# Patient Record
Sex: Male | Born: 1960 | Race: Black or African American | Hispanic: No | Smoking: Never smoker
Health system: Southern US, Community
[De-identification: ages and names within clinical notes are randomized; demographics above are authoritative.]

## PROBLEM LIST (undated history)

## (undated) DIAGNOSIS — G35D Multiple sclerosis, unspecified: Secondary | ICD-10-CM

## (undated) DIAGNOSIS — G8250 Quadriplegia, unspecified: Secondary | ICD-10-CM

## (undated) DIAGNOSIS — J45909 Unspecified asthma, uncomplicated: Secondary | ICD-10-CM

## (undated) DIAGNOSIS — G35 Multiple sclerosis: Secondary | ICD-10-CM

## (undated) DIAGNOSIS — I10 Essential (primary) hypertension: Secondary | ICD-10-CM

## (undated) DIAGNOSIS — R269 Unspecified abnormalities of gait and mobility: Secondary | ICD-10-CM

## (undated) DIAGNOSIS — N400 Enlarged prostate without lower urinary tract symptoms: Secondary | ICD-10-CM

## (undated) HISTORY — DX: Multiple sclerosis: G35

## (undated) HISTORY — PX: OTHER SURGICAL HISTORY: SHX169

## (undated) HISTORY — DX: Quadriplegia, unspecified: G82.50

## (undated) HISTORY — DX: Unspecified abnormalities of gait and mobility: R26.9

## (undated) HISTORY — DX: Benign prostatic hyperplasia without lower urinary tract symptoms: N40.0

## (undated) HISTORY — DX: Essential (primary) hypertension: I10

## (undated) HISTORY — DX: Unspecified asthma, uncomplicated: J45.909

## (undated) HISTORY — DX: Multiple sclerosis, unspecified: G35.D

---

## 2009-01-31 ENCOUNTER — Emergency Department (HOSPITAL_COMMUNITY): Admission: EM | Admit: 2009-01-31 | Discharge: 2009-01-31 | Payer: Self-pay | Admitting: Emergency Medicine

## 2009-02-17 ENCOUNTER — Encounter: Admission: RE | Admit: 2009-02-17 | Discharge: 2009-04-28 | Payer: Self-pay | Admitting: Neurology

## 2009-05-05 ENCOUNTER — Encounter: Admission: RE | Admit: 2009-05-05 | Discharge: 2009-05-21 | Payer: Self-pay | Admitting: Neurology

## 2010-05-23 ENCOUNTER — Encounter: Payer: Self-pay | Admitting: Neurology

## 2010-08-04 LAB — POCT I-STAT, CHEM 8
Calcium, Ion: 1.16 mmol/L (ref 1.12–1.32)
Chloride: 105 mEq/L (ref 96–112)
HCT: 41 % (ref 39.0–52.0)
Potassium: 3.5 mEq/L (ref 3.5–5.1)
Sodium: 141 mEq/L (ref 135–145)

## 2010-08-04 LAB — URINALYSIS, ROUTINE W REFLEX MICROSCOPIC
Ketones, ur: NEGATIVE mg/dL
Leukocytes, UA: NEGATIVE
Nitrite: NEGATIVE
Protein, ur: NEGATIVE mg/dL
Urobilinogen, UA: 0.2 mg/dL (ref 0.0–1.0)

## 2010-08-04 LAB — URINE CULTURE: Culture: NO GROWTH

## 2010-11-14 ENCOUNTER — Other Ambulatory Visit: Payer: Self-pay | Admitting: Emergency Medicine

## 2010-11-14 DIAGNOSIS — N5089 Other specified disorders of the male genital organs: Secondary | ICD-10-CM

## 2010-11-15 ENCOUNTER — Ambulatory Visit
Admission: RE | Admit: 2010-11-15 | Discharge: 2010-11-15 | Disposition: A | Discharge status: Home / Self Care | Payer: 59 | Source: Ambulatory Visit | Attending: Emergency Medicine | Admitting: Emergency Medicine

## 2010-11-15 DIAGNOSIS — N5089 Other specified disorders of the male genital organs: Secondary | ICD-10-CM

## 2011-11-13 ENCOUNTER — Other Ambulatory Visit: Payer: Self-pay | Admitting: Emergency Medicine

## 2011-11-21 ENCOUNTER — Other Ambulatory Visit: Payer: Self-pay | Admitting: Emergency Medicine

## 2011-11-21 NOTE — Telephone Encounter (Signed)
Need chart to nurses station. 

## 2011-12-06 ENCOUNTER — Other Ambulatory Visit: Payer: Self-pay | Admitting: *Deleted

## 2011-12-06 MED ORDER — HYDROCHLOROTHIAZIDE 12.5 MG PO CAPS: 12.5000 mg | ORAL_CAPSULE | Freq: Every day | ORAL | Status: DC

## 2011-12-07 ENCOUNTER — Ambulatory Visit (INDEPENDENT_AMBULATORY_CARE_PROVIDER_SITE_OTHER): Payer: 59 | Admitting: Internal Medicine

## 2011-12-07 VITALS — BP 120/80 | HR 80 | Temp 97.9°F | Resp 16

## 2011-12-07 DIAGNOSIS — R39198 Other difficulties with micturition: Secondary | ICD-10-CM

## 2011-12-07 DIAGNOSIS — R3919 Other difficulties with micturition: Secondary | ICD-10-CM

## 2011-12-07 DIAGNOSIS — G35 Multiple sclerosis: Secondary | ICD-10-CM

## 2011-12-07 DIAGNOSIS — I10 Essential (primary) hypertension: Secondary | ICD-10-CM

## 2011-12-07 MED ORDER — BACLOFEN 20 MG PO TABS: 20.0000 mg | ORAL_TABLET | Freq: Three times a day (TID) | ORAL | Status: DC

## 2011-12-07 MED ORDER — DUTASTERIDE 0.5 MG PO CAPS: 0.5000 mg | ORAL_CAPSULE | Freq: Every day | ORAL | Status: DC

## 2011-12-07 MED ORDER — HYDROCHLOROTHIAZIDE 12.5 MG PO CAPS: 12.5000 mg | ORAL_CAPSULE | Freq: Every day | ORAL | Status: DC

## 2011-12-07 NOTE — Progress Notes (Signed)
  Subjective:    Patient ID: Randy Shaw, male    DOB: [birth year], 51 y.o.   MRN: [id number]  HPI Running out of medications. Needs renewal of his hctz avodart and baclofen. See the neurologist regularily dr love. See the urologist at [facility] urologyy. Has routine check ups with dr Merla Riches. No complaints at present. Does not remember if he has been taking the avodart recently. Bp has been controlled.   Review of Systems  Constitutional:       In a wheelchair chronically for Multiple Schlerosis  HENT: Negative.   Eyes: Negative.   Respiratory: Negative.   Cardiovascular: Negative.   Gastrointestinal: Negative.   Genitourinary: Negative.  Negative for urgency and frequency.  Musculoskeletal: Negative.        Unchanged muscle weakness  Skin: Negative.   Neurological: Positive for weakness.  Hematological: Negative.   Psychiatric/Behavioral: Negative.   All other systems reviewed and are negative.       Objective:   Physical Exam  Nursing note and vitals reviewed. Constitutional: He appears well-developed and well-nourished.  HENT:  Head: Normocephalic and atraumatic.  Right Ear: External ear normal.  Left Ear: External ear normal.  Nose: Nose normal.  Mouth/Throat: Oropharynx is clear and moist.  Eyes: Conjunctivae and EOM are normal. Pupils are equal, round, and reactive to light.  Neck: Normal range of motion. Neck supple.  Cardiovascular: Normal rate, regular rhythm and normal heart sounds.   Pulmonary/Chest: Effort normal and breath sounds normal.  Abdominal: Soft. Bowel sounds are normal.  Musculoskeletal:       Weakness lower extrems unchanged  Neurological: He displays abnormal reflex. Coordination abnormal.       Secondary to ms  Skin: Skin is warm and dry.  Psychiatric: He has a normal mood and affect. His behavior is normal. Judgment and thought content normal.          Assessment & Plan:  Multiple schlerosis Hypertension Will refilll meds Pt  instructed to followup with hte urologist to see if he is to continue the avodart. Also told to routinely check his bp to evaluate hypertension stablility.

## 2011-12-07 NOTE — Patient Instructions (Signed)
Take meds as directed. See your neurologist and urologist in follow up.

## 2011-12-11 ENCOUNTER — Other Ambulatory Visit: Payer: Self-pay | Admitting: Physician Assistant

## 2011-12-11 NOTE — Telephone Encounter (Signed)
Patient just seen, but has urologist-- can we refill?

## 2012-02-16 ENCOUNTER — Other Ambulatory Visit: Payer: Self-pay | Admitting: Internal Medicine

## 2012-02-17 ENCOUNTER — Other Ambulatory Visit: Payer: Self-pay | Admitting: Internal Medicine

## 2012-02-17 ENCOUNTER — Telehealth: Payer: Self-pay

## 2012-02-29 ENCOUNTER — Other Ambulatory Visit: Payer: Self-pay | Admitting: Physician Assistant

## 2012-03-11 ENCOUNTER — Telehealth: Payer: Self-pay

## 2012-03-11 NOTE — Telephone Encounter (Signed)
patient wants to know if we can change his rx for baclofen. He states he takes tid and would like to know if the quantity can be increased. Please advise

## 2012-03-11 NOTE — Telephone Encounter (Signed)
Patient says we need to make modifications on his medication please call patient at [phone]

## 2012-03-12 MED ORDER — BACLOFEN 20 MG PO TABS: 20.0000 mg | ORAL_TABLET | Freq: Three times a day (TID) | ORAL | Status: DC

## 2012-03-12 NOTE — Telephone Encounter (Signed)
Please inform patient quantity increased and sent in.

## 2012-03-12 NOTE — Telephone Encounter (Signed)
Notified pt new Rx sent in. Pt thanked [place].

## 2012-04-22 ENCOUNTER — Other Ambulatory Visit: Payer: Self-pay | Admitting: Internal Medicine

## 2012-05-19 ENCOUNTER — Other Ambulatory Visit: Payer: Self-pay | Admitting: Physician Assistant

## 2012-05-19 NOTE — Telephone Encounter (Signed)
Needs office visit.

## 2012-06-03 ENCOUNTER — Other Ambulatory Visit: Payer: Self-pay | Admitting: Physician Assistant

## 2012-06-24 ENCOUNTER — Other Ambulatory Visit: Payer: Self-pay | Admitting: Physician Assistant

## 2012-06-26 ENCOUNTER — Ambulatory Visit (INDEPENDENT_AMBULATORY_CARE_PROVIDER_SITE_OTHER): Payer: 59 | Admitting: Internal Medicine

## 2012-06-26 ENCOUNTER — Encounter: Payer: Self-pay | Admitting: Internal Medicine

## 2012-06-26 VITALS — BP 112/64 | HR 77 | Temp 97.9°F | Resp 16 | Ht 75.0 in | Wt 197.4 lb

## 2012-06-26 DIAGNOSIS — G35 Multiple sclerosis: Secondary | ICD-10-CM

## 2012-06-26 DIAGNOSIS — M25559 Pain in unspecified hip: Secondary | ICD-10-CM

## 2012-06-26 DIAGNOSIS — M62838 Other muscle spasm: Secondary | ICD-10-CM

## 2012-06-26 DIAGNOSIS — M25552 Pain in left hip: Secondary | ICD-10-CM

## 2012-06-26 DIAGNOSIS — I10 Essential (primary) hypertension: Secondary | ICD-10-CM

## 2012-06-26 MED ORDER — BACLOFEN 20 MG PO TABS: ORAL_TABLET | ORAL | Status: DC

## 2012-06-26 MED ORDER — MELOXICAM 15 MG PO TABS: 15.0000 mg | ORAL_TABLET | Freq: Every day | ORAL | Status: DC

## 2012-06-26 MED ORDER — HYDROCHLOROTHIAZIDE 12.5 MG PO CAPS: ORAL_CAPSULE | ORAL | Status: DC

## 2012-06-29 ENCOUNTER — Encounter: Payer: Self-pay | Admitting: *Deleted

## 2012-06-29 ENCOUNTER — Other Ambulatory Visit: Payer: Self-pay | Admitting: *Deleted

## 2012-06-29 DIAGNOSIS — G35 Multiple sclerosis: Secondary | ICD-10-CM | POA: Insufficient documentation

## 2012-06-29 DIAGNOSIS — N138 Other obstructive and reflux uropathy: Secondary | ICD-10-CM | POA: Insufficient documentation

## 2012-06-29 DIAGNOSIS — N401 Enlarged prostate with lower urinary tract symptoms: Secondary | ICD-10-CM | POA: Insufficient documentation

## 2012-06-29 DIAGNOSIS — I10 Essential (primary) hypertension: Secondary | ICD-10-CM | POA: Insufficient documentation

## 2012-06-29 NOTE — Progress Notes (Signed)
  Subjective:    Patient ID: Randy Shaw, male    DOB: [birth year], 52 y.o.   MRN: [id number]  HPI here for medication refills Patient of Dr. Imagene Gurney w/ MS-doing well on interferon beta-1a ,dimethyl fumarate and baclofen. Also followed by a urology for BPH on Avodart. Followed here for hypertension. Recent complaints of hip pain on the left. Uses wheelchair/mobile device. Pain is intermittent it and does not always interfere with activity. No hip swelling. No known injury. Occasionally it seems as if the left leg will not straighten out the because of this hip or upper thigh area.   Recent labs at neurology  Review of Systems No fever chills or night sweats No weight loss No headaches or vision changes No chest pain or palpitations No edema    Objective:   Physical Exam BP 112/64  Pulse 77  Temp(Src) 97.9 F (36.6 C) (Oral)  Resp 16  Ht 6\' 3"  (1.905 m)  Wt 197 lb 6.4 oz (89.54 kg)  BMI 24.67 kg/m2  SpO2 100% HEENT clear Heart regular without murmur Lungs clear No peripheral edema/good peripheral pulses Left hip has a good range of motion without pain/no pain with pressure over the lateral aspect of the hip/no swelling or mass in the upper thigh/no cords/negative Homans      Assessment & Plan:  HTN (hypertension) - Plan: hydrochlorothiazide (MICROZIDE) 12.5 MG capsule refilled  Muscle spasticity/multiple sclerosis -[facility]  Hip pain, acute, left - Plan: meloxicam (MOBIC) 15 MG tablet-for 2 weeks with general stretching  Followup if not improving or if this begins to interfere with his activity level  BPH- urology  Followup in 6 months for more general exam with labs

## 2013-02-11 ENCOUNTER — Telehealth: Payer: Self-pay | Admitting: Neurology

## 2013-02-11 NOTE — Telephone Encounter (Signed)
Given to [place] to assign

## 2013-02-12 NOTE — Telephone Encounter (Signed)
Reassigned to Dr. Willis. 

## 2013-02-25 ENCOUNTER — Other Ambulatory Visit: Payer: Self-pay

## 2013-02-25 DIAGNOSIS — M62838 Other muscle spasm: Secondary | ICD-10-CM

## 2013-02-25 MED ORDER — BACLOFEN 20 MG PO TABS: ORAL_TABLET | ORAL | Status: DC

## 2013-04-16 ENCOUNTER — Telehealth: Payer: Self-pay | Admitting: Neurology

## 2013-04-16 NOTE — Telephone Encounter (Signed)
Patient called requesting Tecfidera refill. Please call.

## 2013-04-17 MED ORDER — DIMETHYL FUMARATE 240 MG PO CPDR: 240.0000 mg | DELAYED_RELEASE_CAPSULE | Freq: Two times a day (BID) | ORAL | Status: DC

## 2013-04-22 ENCOUNTER — Ambulatory Visit (INDEPENDENT_AMBULATORY_CARE_PROVIDER_SITE_OTHER): Payer: 59 | Admitting: Neurology

## 2013-04-22 ENCOUNTER — Encounter (INDEPENDENT_AMBULATORY_CARE_PROVIDER_SITE_OTHER): Payer: Self-pay

## 2013-04-22 ENCOUNTER — Encounter: Payer: Self-pay | Admitting: Neurology

## 2013-04-22 VITALS — BP 144/88 | HR 90 | Ht 75.0 in | Wt 211.0 lb

## 2013-04-22 DIAGNOSIS — Z5181 Encounter for therapeutic drug level monitoring: Secondary | ICD-10-CM

## 2013-04-22 DIAGNOSIS — R269 Unspecified abnormalities of gait and mobility: Secondary | ICD-10-CM | POA: Insufficient documentation

## 2013-04-22 DIAGNOSIS — G35 Multiple sclerosis: Secondary | ICD-10-CM

## 2013-04-22 HISTORY — DX: Unspecified abnormalities of gait and mobility: R26.9

## 2013-04-22 LAB — CBC WITH DIFFERENTIAL
Eos: 6 %
Monocytes: 10 %
Neutrophils Relative %: 59 %
Platelets: 216 10*3/uL (ref 150–379)
RBC: 5.07 x10E6/uL (ref 4.14–5.80)
WBC: 5.1 10*3/uL (ref 3.4–10.8)

## 2013-04-22 LAB — COMPREHENSIVE METABOLIC PANEL
AST: 21 IU/L (ref 0–40)
Alkaline Phosphatase: 69 IU/L (ref 39–117)
BUN/Creatinine Ratio: 23 — ABNORMAL HIGH (ref 9–20)
BUN: 18 mg/dL (ref 6–24)
CO2: 30 mmol/L — ABNORMAL HIGH (ref 18–29)
Chloride: 103 mmol/L (ref 96–108)
Sodium: 143 mmol/L (ref 134–144)

## 2013-04-22 MED ORDER — DIMETHYL FUMARATE 240 MG PO CPDR: 240.0000 mg | DELAYED_RELEASE_CAPSULE | Freq: Two times a day (BID) | ORAL | Status: DC

## 2013-04-22 NOTE — Patient Instructions (Signed)
Multiple Sclerosis Multiple sclerosis (MS) is a disease of the central nervous system. Its cause is unknown. It is more common in the northern states than in the southern states. There is a higher incidence of MS in women. There is a wide variation in the symptoms (problems) of MS. This is because of the many different ways it affects the central nervous system. It often comes on in episodes or attacks. These attacks may last weeks to months. There may be long periods of nearly no problems between attacks. The main symptoms include visual problems (associated with eye pain), numbness, weakness, and paralysis in extremities (arms/hands and legs/feet). There may also be tremors and problems with balance and walking. The age when MS starts is variable. Advances in medicine continue to improve the treatment of this illness. There is no known cure for MS but there are medications that help. MS is not an inherited illness, although your risk of getting this disease is higher if you have a relative with MS. The best radiologic (x-ray) study for MS is an MRI (magnetic resonance imaging). There are medications available to decrease the number and frequency of attacks. SYMPTOMS  The symptoms of MS are caused by loss of insulation (myelin) of the nerves of the brain. When this happens, brain signals do not get transmitted properly or may not get transmitted at all. Some of the problems caused by this include:   Numbness.  Weakness.  Paralysis in extremities.  Visual problems, eye pain.  Balance problems.  Tremors. DIAGNOSIS  Your caregiver can do studies on you to make this diagnosis. This may include specialized X-rays and spinal fluid studies. HOME CARE INSTRUCTIONS   Take medications as directed by your caregiver. Baclofen is a drug commonly used to reduce muscle spasticity. Steroids are often used for short term relief.  Exercise as directed.  Use physical and occupational therapy as directed by  your caregiver. Careful attention to this medical care can help avoid depression.  See your caregiver if you begin to have problems with depression. This is a common problem in MS. Patients often continue to work many years after the diagnosis of MS. Document Released: 04/14/2000 Document Revised: 07/10/2011 Document Reviewed: 11/21/2006 Exit[employer] Patient Information 2014 [employer].  

## 2013-04-22 NOTE — Progress Notes (Signed)
Reason for visit: Multiple sclerosis  Randy Shaw is an 52 y.o. male  History of present illness:  Randy Shaw is a 52 year old right-handed black male with a history of multiple sclerosis and a gait disorder. 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 The patient has spasticity in the lower extremities. MRI evaluations in the past have shown fairly extensive involvement of the brain and spinal cord. The patient is on baclofen for spasticity, and he does note some painless spasms in the legs during the day and night. The patient has a brace involving the left knee and ankle. The patient lives alone at home, and he has a wheelchair and a scooter for mobility. The patient indicates that he can walk with a walker, but he can only walk very slowly. The patient has fallen on occasion. The patient was last seen through this office in January of 2013. Approximately one year ago, the patient was switched from Rebif to Tecfidera. The patient tolerated the medication well. The patient does have some problems with double vision, slurred speech, without problems swallowing. The patient reports some urgency of the bladder, no incontinence. The patient has not had recent blood work done. The patient returns to this office for further evaluation. In the past, the patient has been on Avonex, then switched to Betaseron, and then to Rebif.  Past Medical History  Diagnosis Date  . Multiple sclerosis   . Hypertension   . BPH (benign prostatic hyperplasia)   . Asthma   . Abnormality of gait 04/22/2013    Past Surgical History  Procedure Laterality Date  . None      Family History  Problem Relation Age of Onset  . Cancer Mother     breast  . Stroke Father   . Cancer - Colon Father   . Multiple sclerosis Neg Hx     Social history:  reports that he has never smoked. He does not have any smokeless tobacco history on file. He reports that he does not drink alcohol or use  illicit drugs.    Allergies  Allergen Reactions  . Peanut-Containing Drug Products Shortness Of Breath  . Zanaflex [Tizanidine Hcl] Shortness Of Breath    Medications:  Current Outpatient Prescriptions on File Prior to Visit  Medication Sig Dispense Refill  . baclofen (LIORESAL) 20 MG tablet TAKE ONE TABLET BY MOUTH THREE TIMES DAILY  270 each  0  . hydrochlorothiazide (MICROZIDE) 12.5 MG capsule TAKE ONE CAPSULE BY MOUTH EVERY DAY  90 capsule  3  . Tamsulosin HCl (FLOMAX) 0.4 MG CAPS Take 1 capsule (0.4 mg total) by mouth daily. Urologist to write future refills  30 capsule  0   No current facility-administered medications on file prior to visit.    ROS:  Out of a complete 14 system review of symptoms, the patient complains only of the following symptoms, and all other reviewed systems are negative.  Gait disorder Stiffness in the legs Weakness, numbness Double vision  Blood pressure 144/88, pulse 90, height 6\' 3"  (1.905 m), weight 211 lb (95.709 kg).  Physical Exam  General: The patient is alert and cooperative at the time of the examination.  Skin: No significant peripheral edema is noted.   Neurologic Exam  Mental status: The patient is oriented x 3.  Cranial nerves: Facial symmetry is present. Speech is normal, no aphasia or dysarthria is noted. Extraocular movements are full, but the patient has some divergence of gaze with superior  gaze. The patient has evidence of a left INO. Visual fields are full.  Motor: The patient has good strength in the upper extremities. With the lower extremities, there is 4 minus/5 strength of the left leg, the left knee and left ankle are brace. The patient has 4/5 strength of the right lower extremity,some increased motor tone noted.  Sensory examination: Soft touch sensation is symmetric on the face and arms.  Coordination: The patient has good finger-nose-finger with the right arm, dysmetria seen on the left. The patient is able to  perform heel to shin with some difficulty with the right leg, unable to perform with the left.  Gait and station: The patient is in a scooter, gait was not tested.  Reflexes: Deep tendon reflexes are symmetric, actually somewhat depressed in the legs..   Assessment/Plan:  1. Multiple sclerosis  2. Gait disorder  The patient is tolerating the Tecfidera fairly well. The patient will undergo blood work today, and he will followup in about 6 months. The last MRI evaluations that I have on record were done at [facility] in 2009. We will consider a repeat MRI evaluation of the brain and cervical spinal cord on the next revisit. A prescription was given for Tecfidera, samples were given, as the patient is out of his medication. The baclofen dosing can be adjusted if needed.   Marlan Palau MD 04/22/2013 8:35 AM  [facility] [address] [place], [place] [address]  Phone [phone] Fax [phone]

## 2013-04-23 ENCOUNTER — Telehealth: Payer: Self-pay

## 2013-04-23 NOTE — Telephone Encounter (Signed)
Message copied by Doree Barthel on Wed Apr 23, 2013  8:27 AM ------      Message from: Stephanie Acre      Created: Tue Apr 22, 2013  5:30 PM       Please call the patient. The blood work results are unremarkable. Thank you.            ----- Message -----         From: [facility] Lab Results In Interface         Sent: 04/22/2013   4:38 PM           To: York Spaniel, MD                   ------

## 2013-05-13 ENCOUNTER — Ambulatory Visit (INDEPENDENT_AMBULATORY_CARE_PROVIDER_SITE_OTHER): Payer: 59 | Admitting: Family Medicine

## 2013-05-13 ENCOUNTER — Ambulatory Visit: Payer: 59

## 2013-05-13 VITALS — BP 150/90 | HR 84 | Temp 98.2°F | Resp 16 | Ht 75.0 in | Wt 208.0 lb

## 2013-05-13 DIAGNOSIS — S40022A Contusion of left upper arm, initial encounter: Secondary | ICD-10-CM

## 2013-05-13 DIAGNOSIS — M79609 Pain in unspecified limb: Secondary | ICD-10-CM

## 2013-05-13 DIAGNOSIS — S40029A Contusion of unspecified upper arm, initial encounter: Secondary | ICD-10-CM

## 2013-05-13 NOTE — Patient Instructions (Signed)
Continue symptomatic treatment of your shoulder. Apply heat if necessary. Take Tylenol or ibuprofen for pain. Return if symptoms persist

## 2013-05-13 NOTE — Telephone Encounter (Signed)
Tried to reach patient several times. No answer.

## 2013-05-13 NOTE — Progress Notes (Signed)
Subjective: 53 year old gentleman who has a almost 20 year history of MS. He is disabled from this but gets around in his home satisfactorily. He is able to get himself up and move between pieces of furniture. He fell by his bed almost a week ago, striking hard on his left shoulder. He says that he has many falls, but it is unusual for him to fall this hard. He took a heavy blow on the deltoid area of the right shoulder. It is bruised badly, and is still painful. However he has good range of motion of the shoulder. He was inclined just to give it more time to resolve, but his visiting nurse felt a very poor that he see a doctor for it. The left arm his his weaker and less coordinated arm. His right arm is his dominant arm.  Objective: Alert oriented gentleman riding a motor scooter. He has a large bruise about 2 x 3.5" on his left upper arm laterally. It is tender over the bruise. However the surrounding area is not particularly tender. His shoulder motion is adequate. Grip is very good.  Assessment:  Contusion and pain left upper arm  Plan: X-ray to make sure there is nothing cracked  [facility] reading (PRIMARY) by  Dr. Alwyn RenHOPPER Normal humerus.

## 2013-07-07 ENCOUNTER — Other Ambulatory Visit: Payer: Self-pay | Admitting: Internal Medicine

## 2013-07-10 ENCOUNTER — Telehealth: Payer: Self-pay

## 2013-07-10 NOTE — Telephone Encounter (Signed)
Patient came in for a visit with Randy Shaw on the 13th, he says the pharmacy is telling him he needs a visit with Randy Shaw to get rx refills please call [phone]

## 2013-07-10 NOTE — Telephone Encounter (Signed)
Advised pt he needed to come in for a follow up for his medical condition. We saw him on January 13 for an acute concern. Pt understands and will RTC.

## 2013-07-25 ENCOUNTER — Ambulatory Visit (INDEPENDENT_AMBULATORY_CARE_PROVIDER_SITE_OTHER): Payer: 59 | Admitting: Emergency Medicine

## 2013-07-25 VITALS — BP 132/90 | HR 80 | Temp 98.1°F | Resp 18 | Ht 75.0 in | Wt 207.0 lb

## 2013-07-25 DIAGNOSIS — I10 Essential (primary) hypertension: Secondary | ICD-10-CM

## 2013-07-25 DIAGNOSIS — G35 Multiple sclerosis: Secondary | ICD-10-CM

## 2013-07-25 MED ORDER — OXYBUTYNIN CHLORIDE 5 MG PO TABS: 5.0000 mg | ORAL_TABLET | Freq: Three times a day (TID) | ORAL | Status: DC

## 2013-07-25 MED ORDER — BACLOFEN 20 MG PO TABS: 20.0000 mg | ORAL_TABLET | Freq: Three times a day (TID) | ORAL | Status: DC

## 2013-07-25 MED ORDER — HYDROCHLOROTHIAZIDE 12.5 MG PO CAPS: ORAL_CAPSULE | ORAL | Status: DC

## 2013-07-25 NOTE — Patient Instructions (Signed)

## 2013-07-25 NOTE — Progress Notes (Signed)
[facility] [address], [place] [id number] [phone]  Date:  07/25/2013   Name:  Randy BarthelCharles M Shaw   DOB:  [birth year]   MRN:  [id number]  PCP:  Tonye PearsonOLITTLE, ROBERT P, MD    Chief Complaint: rx refills   History of Present Illness:  Randy Shaw is a 53 y.o. very pleasant male patient who presents with the following:  Says he needs refills on a number of drugs.  Feels well and has no acute complaints.  Denies other complaint or health concern today.   Patient Active Problem List   Diagnosis Date Noted  . Abnormality of gait 04/22/2013  . HTN (hypertension) 06/29/2012  . Multiple sclerosis 06/29/2012  . BPH (benign prostatic hypertrophy) with urinary obstruction 06/29/2012    Past Medical History  Diagnosis Date  . Multiple sclerosis   . Hypertension   . BPH (benign prostatic hyperplasia)   . Asthma   . Abnormality of gait 04/22/2013    Past Surgical History  Procedure Laterality Date  . None      History  Substance Use Topics  . Smoking status: Never Smoker   . Smokeless tobacco: Not on file  . Alcohol Use: No    Family History  Problem Relation Age of Onset  . Cancer Mother     breast  . Stroke Father   . Cancer - Colon Father   . Multiple sclerosis Neg Hx     Allergies  Allergen Reactions  . Peanut-Containing Drug Products Shortness Of Breath  . Zanaflex [Tizanidine Hcl] Shortness Of Breath    Medication list has been reviewed and updated.  Current Outpatient Prescriptions on File Prior to Visit  Medication Sig Dispense Refill  . albuterol (PROVENTIL HFA;VENTOLIN HFA) 108 (90 BASE) MCG/ACT inhaler Inhale 2 puffs into the lungs every 6 (six) hours as needed for wheezing or shortness of breath.      . Dimethyl Fumarate (TECFIDERA) 240 MG CPDR Take 1 capsule (240 mg total) by mouth 2 (two) times daily.  180 capsule  3  . Tamsulosin HCl (FLOMAX) 0.4 MG CAPS Take 1 capsule (0.4 mg total) by mouth daily. Urologist to write  future refills  30 capsule  0   No current facility-administered medications on file prior to visit.    Review of Systems:  As per HPI, otherwise negative.    Physical Examination: Filed Vitals:   07/25/13 1421  BP: 132/90  Pulse: 80  Temp: 98.1 F (36.7 C)  Resp: 18   Filed Vitals:   07/25/13 1421  Height: 6\' 3"  (1.905 m)  Weight: 207 lb (93.895 kg)   Body mass index is 25.87 kg/(m^2). Ideal Body Weight: Weight in (lb) to have BMI = 25: 199.6   GEN: WDWN, NAD, Non-toxic, Alert & Oriented x 3 HEENT: Atraumatic, Normocephalic.  Ears and Nose: No external deformity. EXTR: No clubbing/cyanosis/edema NEURO: Normal gait.  PSYCH: Normally interactive. Conversant. Not depressed or anxious appearing.  Calm demeanor.    Assessment and Plan: MS HBP BPH Needs labs but is not fasting   Signed,  Phillips OdorJeffery Louetta Hollingshead, MD

## 2013-07-25 NOTE — Addendum Note (Signed)
Addended byLevon Hedger A on: 07/25/2013 03:20 PM   Modules accepted: Orders

## 2013-07-30 ENCOUNTER — Other Ambulatory Visit (INDEPENDENT_AMBULATORY_CARE_PROVIDER_SITE_OTHER): Payer: 59

## 2013-07-30 DIAGNOSIS — G35 Multiple sclerosis: Secondary | ICD-10-CM

## 2013-07-30 DIAGNOSIS — I10 Essential (primary) hypertension: Secondary | ICD-10-CM

## 2013-07-30 LAB — CBC WITH DIFFERENTIAL/PLATELET
BASOS ABS: 0 10*3/uL (ref 0.0–0.1)
BASOS PCT: 1 % (ref 0–1)
EOS ABS: 0.3 10*3/uL (ref 0.0–0.7)
Eosinophils Relative: 7 % — ABNORMAL HIGH (ref 0–5)
HCT: 41 % (ref 39.0–52.0)
HEMOGLOBIN: 14.2 g/dL (ref 13.0–17.0)
Lymphocytes Relative: 32 % (ref 12–46)
Lymphs Abs: 1.3 10*3/uL (ref 0.7–4.0)
MCH: 27.8 pg (ref 26.0–34.0)
MCHC: 34.6 g/dL (ref 30.0–36.0)
MCV: 80.2 fL (ref 78.0–100.0)
MONOS PCT: 11 % (ref 3–12)
Monocytes Absolute: 0.5 10*3/uL (ref 0.1–1.0)
NEUTROS ABS: 2.1 10*3/uL (ref 1.7–7.7)
NEUTROS PCT: 49 % (ref 43–77)
Platelets: 214 10*3/uL (ref 150–400)
RBC: 5.11 MIL/uL (ref 4.22–5.81)
RDW: 12.9 % (ref 11.5–15.5)
WBC: 4.2 10*3/uL (ref 4.0–10.5)

## 2013-07-30 LAB — COMPLETE METABOLIC PANEL WITH GFR
ALBUMIN: 4.4 g/dL (ref 3.5–5.2)
ALK PHOS: 66 U/L (ref 39–117)
ALT: 26 U/L (ref 0–53)
AST: 21 U/L (ref 0–37)
BUN: 13 mg/dL (ref 6–23)
CALCIUM: 9.9 mg/dL (ref 8.4–10.5)
CHLORIDE: 103 meq/L (ref 96–112)
CO2: 30 mEq/L (ref 19–32)
Creat: 0.89 mg/dL (ref 0.50–1.35)
GFR, Est African American: >89 mL/min
GFR, Est Non African American: >89 mL/min
Glucose, Bld: 96 mg/dL (ref 70–99)
POTASSIUM: 3.9 meq/L (ref 3.5–5.3)
SODIUM: 141 meq/L (ref 135–145)
TOTAL PROTEIN: 7.2 g/dL (ref 6.0–8.3)
Total Bilirubin: 0.6 mg/dL (ref 0.2–1.2)

## 2013-07-30 LAB — LIPID PANEL
CHOLESTEROL: 196 mg/dL (ref 0–200)
HDL: 57 mg/dL (ref >39)
LDL CALC: 117 mg/dL — AB (ref 0–99)
Total CHOL/HDL Ratio: 3.4 Ratio
Triglycerides: 111 mg/dL (ref <150)
VLDL: 22 mg/dL (ref 0–40)

## 2013-07-30 NOTE — Progress Notes (Signed)
Patient here for labs only. 

## 2013-07-31 LAB — PSA: PSA: 0.7 ng/mL (ref <=4.00)

## 2013-10-22 ENCOUNTER — Ambulatory Visit (INDEPENDENT_AMBULATORY_CARE_PROVIDER_SITE_OTHER): Payer: 59 | Admitting: Adult Health

## 2013-10-22 ENCOUNTER — Encounter: Payer: Self-pay | Admitting: Adult Health

## 2013-10-22 ENCOUNTER — Encounter (INDEPENDENT_AMBULATORY_CARE_PROVIDER_SITE_OTHER): Payer: Self-pay

## 2013-10-22 VITALS — BP 142/89 | HR 84 | Temp 98.2°F | Ht 75.0 in | Wt 196.0 lb

## 2013-10-22 DIAGNOSIS — R269 Unspecified abnormalities of gait and mobility: Secondary | ICD-10-CM

## 2013-10-22 DIAGNOSIS — G35 Multiple sclerosis: Secondary | ICD-10-CM

## 2013-10-22 NOTE — Patient Instructions (Signed)

## 2013-10-22 NOTE — Progress Notes (Addendum)
PATIENT: Randy Shaw DOB: Mar 31, 1961  REASON FOR VISIT: follow up HISTORY FROM: patient  HISTORY OF PRESENT ILLNESS: Mr. Nethercott is a 53 year old right-handed black male with a history of multiple sclerosis and a gait disorder. He returns today for follow-up. The patient is currently taking tecfidera and is tolerating it well. The patient takes baclofen for spasticity and feels that it has beneficial. Patient states that he forgot to take one of his doses of baclofen and actually fell because of the "stiffness." The fall occurred this past Saturday, patient states that he did hit his shoulder and states that it has been sore but he has full range of motion. 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  The patient uses a wheelchair primarily. The patient is able to ambulate using a walker but primarily uses the scooter. Patient does report some double vision that has been ongoing. Patient does have some urgency with urination. States that if he feels the urge he must go right then or else he will have an accident. Patient is unsure if the oxybutynin has been beneficial. Denies problems with bowels. Denies problems with swallowing. No new medical issues since the last visit.   REVIEW OF SYSTEMS: Full 14 system review of systems performed and notable only for:  Constitutional: N/A  Eyes: Double vision Ear/Nose/Throat: N/A  Skin: N/A  Cardiovascular: N/A  Respiratory: N/A  Gastrointestinal: N/A  Genitourinary: N/A Hematology/Lymphatic: N/A  Endocrine: N/A Musculoskeletal: Neck pain, next deafness  Allergy/Immunology: N/A  Neurological: Tremors Psychiatric: N/A Sleep: N/A   ALLERGIES: Allergies  Allergen Reactions  . Peanut-Containing Drug Products Shortness Of Breath  . Zanaflex [Tizanidine Hcl] Shortness Of Breath    HOME MEDICATIONS: Outpatient Prescriptions Prior to Visit  Medication Sig Dispense Refill  . albuterol (PROVENTIL HFA;VENTOLIN HFA)  108 (90 BASE) MCG/ACT inhaler Inhale 2 puffs into the lungs every 6 (six) hours as needed for wheezing or shortness of breath.      . baclofen (LIORESAL) 20 MG tablet Take 1 tablet (20 mg total) by mouth 3 (three) times daily.  90 tablet  12  . Dimethyl Fumarate (TECFIDERA) 240 MG CPDR Take 1 capsule (240 mg total) by mouth 2 (two) times daily.  180 capsule  3  . hydrochlorothiazide (MICROZIDE) 12.5 MG capsule TAKE ONE CAPSULE BY MOUTH EVERY DAY  30 capsule  12  . oxybutynin (DITROPAN) 5 MG tablet Take 1 tablet (5 mg total) by mouth 3 (three) times daily.  90 tablet  12  . Tamsulosin HCl (FLOMAX) 0.4 MG CAPS Take 1 capsule (0.4 mg total) by mouth daily. Urologist to write future refills  30 capsule  0   No facility-administered medications prior to visit.    PAST MEDICAL HISTORY: Past Medical History  Diagnosis Date  . Multiple sclerosis   . Hypertension   . BPH (benign prostatic hyperplasia)   . Asthma   . Abnormality of gait 04/22/2013    PAST SURGICAL HISTORY: Past Surgical History  Procedure Laterality Date  . None      FAMILY HISTORY: Family History  Problem Relation Age of Onset  . Cancer Mother     breast  . Stroke Father   . Cancer - Colon Father   . Multiple sclerosis Neg Hx     SOCIAL HISTORY: History   Social History  . Marital Status: Divorced    Spouse Name: N/A    Number of Children: 3  . Years of Education: 76  Occupational History  . diabled    Social History Main Topics  . Smoking status: Never Smoker   . Smokeless tobacco: Not on file  . Alcohol Use: No  . Drug Use: No  . Sexual Activity: Not on file   Other Topics Concern  . Not on file   Social History Narrative  . No narrative on file      PHYSICAL EXAM  Filed Vitals:   10/22/13 1317  BP: 142/89  Pulse: 84  Temp: 98.2 F (36.8 C)  TempSrc: Oral  Height: 6\' 3"  (1.905 m)  Weight: 196 lb (88.905 kg)   Body mass index is 24.5 kg/(m^2).  Generalized: Well developed, in no  acute distress   Neurological examination  Mentation: Alert oriented to time, place, history taking. Follows all commands speech and language fluent Cranial nerve II-XII:  Extraocular movements were full, visual field were full on confrontational test.  Motor: The motor testing reveals 5 over 5 strength of all upper extremities. 3/5 in lower extremities. Good symmetric motor tone is noted throughout.  Sensory: Sensory testing is intact to soft touch on all 4 extremities. No evidence of extinction is noted.  Coordination: Cerebellar testing reveals good finger-nose-finger. Difficult with heel-to-shin bilaterally.  Gait and station: patient in a scooter. Is able to stand with assistance but balance is unsteady.Tandem gait not attempted.  Reflexes: Deep tendon reflexes are symmetric and normal bilaterally.    DIAGNOSTIC DATA (LABS, IMAGING, TESTING) - I reviewed patient records, labs, notes, testing and imaging myself where available.  Lab Results  Component Value Date   WBC 4.2 07/30/2013   HGB 14.2 07/30/2013   HCT 41.0 07/30/2013   MCV 80.2 07/30/2013   PLT 214 07/30/2013      Component Value Date/Time   NA 141 07/30/2013 0952   NA 143 04/22/2013 0829   K 3.9 07/30/2013 0952   CL 103 07/30/2013 0952   CO2 30 07/30/2013 0952   GLUCOSE 96 07/30/2013 0952   GLUCOSE 112* 04/22/2013 0829   BUN 13 07/30/2013 0952   BUN 18 04/22/2013 0829   CREATININE 0.89 07/30/2013 0952   CREATININE 0.78 04/22/2013 0829   CALCIUM 9.9 07/30/2013 0952   PROT 7.2 07/30/2013 0952   PROT 7.1 04/22/2013 0829   ALBUMIN 4.4 07/30/2013 0952   AST 21 07/30/2013 0952   ALT 26 07/30/2013 0952   ALKPHOS 66 07/30/2013 0952   BILITOT 0.6 07/30/2013 0952   GFRNONAA >89 07/30/2013 0952   GFRNONAA 103 04/22/2013 0829   GFRAA >89 07/30/2013 0952   GFRAA 119 04/22/2013 0829   Lab Results  Component Value Date   CHOL 196 07/30/2013   HDL 57 07/30/2013   LDLCALC 130117* 07/30/2013   TRIG 111 07/30/2013   CHOLHDL 3.4 07/30/2013       ASSESSMENT AND  PLAN 53 y.o. year old male  has a past medical history of Multiple sclerosis; Hypertension; BPH (benign prostatic hyperplasia); Asthma; and Abnormality of gait (04/22/2013). here with:  1. Multiple sclerosis 2. Abnormality of gait  Patient has remained stable. Currently taking tecfidera and tolerating well. The patient did have a recent fall but states it was due to a missed baclofen dose. I recommended physical therapy for the patient but he deferred due to the co pay cost. Patient recently had blood work in April. Patient has not had an MRI of brain and cervical spine in several years. I will order MRI of brain and cervical spine today looking for progression of MS.  Patient should follow-up in 6 months or sooner if needed.   Butch Penny, MSN, NP-C 10/22/2013, 1:40 PM [facility] [address] [place], [place] [address] [phone]  Note: This document was prepared with digital dictation and possible smart phrase technology. Any transcriptional errors that result from this process are unintentional. I have read the note, and I agree with the clinical assessment and plan.  Lesly Dukes

## 2013-10-22 NOTE — Progress Notes (Signed)
I have read the note, and I agree with the clinical assessment and plan.  WILLIS,Hesham KEITH   

## 2013-10-29 ENCOUNTER — Other Ambulatory Visit: Payer: 59

## 2013-11-26 ENCOUNTER — Ambulatory Visit (INDEPENDENT_AMBULATORY_CARE_PROVIDER_SITE_OTHER): Payer: 59

## 2013-11-26 DIAGNOSIS — G35 Multiple sclerosis: Secondary | ICD-10-CM

## 2013-11-26 MED ORDER — GADOPENTETATE DIMEGLUMINE 469.01 MG/ML IV SOLN: 20.0000 mL | Freq: Once | INTRAVENOUS | Status: AC | PRN

## 2013-11-28 ENCOUNTER — Ambulatory Visit (INDEPENDENT_AMBULATORY_CARE_PROVIDER_SITE_OTHER): Payer: 59 | Admitting: Emergency Medicine

## 2013-11-28 VITALS — BP 124/82 | HR 81 | Temp 98.1°F | Resp 18

## 2013-11-28 DIAGNOSIS — L97509 Non-pressure chronic ulcer of other part of unspecified foot with unspecified severity: Secondary | ICD-10-CM

## 2013-11-28 DIAGNOSIS — L84 Corns and callosities: Secondary | ICD-10-CM

## 2013-11-28 DIAGNOSIS — G35 Multiple sclerosis: Secondary | ICD-10-CM

## 2013-11-28 DIAGNOSIS — L97512 Non-pressure chronic ulcer of other part of right foot with fat layer exposed: Secondary | ICD-10-CM

## 2013-11-28 NOTE — Progress Notes (Signed)
[facility] [address], [place] [id number] [phone]  Date:  11/28/2013   Name:  Randy BarthelCharles M Shaw   DOB:  [birth year]   MRN:  [id number]  PCP:  Tonye PearsonOLITTLE, ROBERT P, MD    Chief Complaint: sore on right foot, sore on left toe and Diplopia   History of Present Illness:  Randy Shaw is a 53 y.o. very pleasant male patient who presents with the following:  Several week history of a sore on the medial left midfoot.  No recollection of injury. Not healing.  Wears an orthotic on the left leg.  Fully disabled No improvement with over the counter medications or other home remedies. Denies other complaint or health concern today.   Patient Active Problem List   Diagnosis Date Noted  . Abnormality of gait 04/22/2013  . HTN (hypertension) 06/29/2012  . Multiple sclerosis 06/29/2012  . BPH (benign prostatic hypertrophy) with urinary obstruction 06/29/2012    Past Medical History  Diagnosis Date  . Multiple sclerosis   . Hypertension   . BPH (benign prostatic hyperplasia)   . Asthma   . Abnormality of gait 04/22/2013    Past Surgical History  Procedure Laterality Date  . None      History  Substance Use Topics  . Smoking status: Never Smoker   . Smokeless tobacco: Not on file  . Alcohol Use: No    Family History  Problem Relation Age of Onset  . Cancer Mother     breast  . Stroke Father   . Cancer - Colon Father   . Multiple sclerosis Neg Hx     Allergies  Allergen Reactions  . Peanut-Containing Drug Products Shortness Of Breath  . Zanaflex [Tizanidine Hcl] Shortness Of Breath    Medication list has been reviewed and updated.  Current Outpatient Prescriptions on File Prior to Visit  Medication Sig Dispense Refill  . albuterol (PROVENTIL HFA;VENTOLIN HFA) 108 (90 BASE) MCG/ACT inhaler Inhale 2 puffs into the lungs every 6 (six) hours as needed for wheezing or shortness of breath.      . baclofen (LIORESAL) 20 MG tablet Take 1 tablet  (20 mg total) by mouth 3 (three) times daily.  90 tablet  12  . Dimethyl Fumarate (TECFIDERA) 240 MG CPDR Take 1 capsule (240 mg total) by mouth 2 (two) times daily.  180 capsule  3  . hydrochlorothiazide (MICROZIDE) 12.5 MG capsule TAKE ONE CAPSULE BY MOUTH EVERY DAY  30 capsule  12  . oxybutynin (DITROPAN) 5 MG tablet Take 1 tablet (5 mg total) by mouth 3 (three) times daily.  90 tablet  12  . Tamsulosin HCl (FLOMAX) 0.4 MG CAPS Take 1 capsule (0.4 mg total) by mouth daily. Urologist to write future refills  30 capsule  0   No current facility-administered medications on file prior to visit.    Review of Systems:  As per HPI, otherwise negative.    Physical Examination: Filed Vitals:   11/28/13 1356  BP: 124/82  Pulse: 81  Temp: 98.1 F (36.7 C)  Resp: 18   There were no vitals filed for this visit. There is no weight on file to calculate BMI. Ideal Body Weight:     GEN: WDWN, NAD, Non-toxic, Alert & Oriented x 3 HEENT: Atraumatic, Normocephalic.  Ears and Nose: No external deformity. EXTR: No clubbing/cyanosis/edema NEURO: assisted unsteady gait.  PSYCH: Normally interactive. Conversant. Not depressed or anxious appearing.  Calm demeanor.  RIGHT foot:  0.5 cm ulceration medial midfoot with granulation tissue  LEFT foot:  Forefoot callous  Assessment and Plan: Ulceration right foot. Chemical cauterization  Signed,  Phillips Odor, MD

## 2013-12-04 NOTE — Progress Notes (Signed)
Quick Note:  Spoke with patient and informed him that his MRI scan had no changes from his previous scan, no progression of MS. Patient verbalized understanding, instructed patient to call back with any questions or concerns. ______

## 2014-03-30 ENCOUNTER — Other Ambulatory Visit: Payer: Self-pay | Admitting: Neurology

## 2014-04-27 ENCOUNTER — Ambulatory Visit: Payer: 59 | Admitting: Adult Health

## 2014-04-29 ENCOUNTER — Encounter: Payer: Self-pay | Admitting: Adult Health

## 2014-05-08 ENCOUNTER — Ambulatory Visit (INDEPENDENT_AMBULATORY_CARE_PROVIDER_SITE_OTHER): Payer: 59 | Admitting: Adult Health

## 2014-05-08 ENCOUNTER — Encounter: Payer: Self-pay | Admitting: Adult Health

## 2014-05-08 VITALS — BP 126/83 | HR 98 | Ht 75.0 in | Wt 190.0 lb

## 2014-05-08 DIAGNOSIS — Z0289 Encounter for other administrative examinations: Secondary | ICD-10-CM

## 2014-05-08 DIAGNOSIS — R413 Other amnesia: Secondary | ICD-10-CM

## 2014-05-08 DIAGNOSIS — R269 Unspecified abnormalities of gait and mobility: Secondary | ICD-10-CM

## 2014-05-08 DIAGNOSIS — G35 Multiple sclerosis: Secondary | ICD-10-CM

## 2014-05-08 DIAGNOSIS — Z5181 Encounter for therapeutic drug level monitoring: Secondary | ICD-10-CM

## 2014-05-08 NOTE — Progress Notes (Signed)
PATIENT: Randy Shaw DOB: 30-May-1960  REASON FOR VISIT: follow up HISTORY FROM: patient CHIEF COMPLAINT: Multiple sclerosis, abnormality of gait  HISTORY OF PRESENT ILLNESS: Randy Shaw is a 54 year old male with a history of multiple sclerosis and a gait disorder. He returns today for follow-up. He continues to take tecfidera and tolerating it well but he feels that his memory has been affected. He states that he missed the last appointment but was not aware of the appointment. He has to write down things in order to remember it. Denies having to give up anything due to his memory. Lives at home alone. He does all the cooking and cleaning at his house. Denies any new numbness or weakness. He states that he has ongoing weakness on the left side. He states that he will have double vision but again this has been ongoing. Patient has urgency but no changes with bowels. He is currently on oxybutnin. He denies having any accidents. He continues to use baclofen and feels that it works well.   HISTORY 10/22/13: Randy Shaw is a 54 year old right-handed black male with a history of multiple sclerosis and a gait disorder. He returns today for follow-up. The patient is currently taking tecfidera and is tolerating it well. The patient takes baclofen for spasticity and feels that it has beneficial. Patient states that he forgot to take one of his doses of baclofen and actually fell because of the "stiffness." The fall occurred this past Saturday, patient states that he did hit his shoulder and states that it has been sore but he has full range of motion. 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 The patient uses a wheelchair primarily. The patient is able to ambulate using a walker but primarily uses the scooter. Patient does report some double vision that has been ongoing. Patient does have some urgency with urination. States that if he feels the urge he must go right then or else  he will have an accident. Patient is unsure if the oxybutynin has been beneficial. Denies problems with bowels. Denies problems with swallowing. No new medical issues since the last visit.  REVIEW OF SYSTEMS: Out of a complete 14 system review of symptoms, the patient complains only of the following symptoms, and all other reviewed systems are negative.  Ringing in ears, double vision, flushing, insomnia, frequent waking, and frequency of urination, urinary decreased, walking difficulty, speech difficulty, weakness, decreased concentration, depression  ALLERGIES: Allergies  Allergen Reactions  . Peanut-Containing Drug Products Shortness Of Breath  . Zanaflex [Tizanidine Hcl] Shortness Of Breath    HOME MEDICATIONS: Outpatient Prescriptions Prior to Visit  Medication Sig Dispense Refill  . albuterol (PROVENTIL HFA;VENTOLIN HFA) 108 (90 BASE) MCG/ACT inhaler Inhale 2 puffs into the lungs every 6 (six) hours as needed for wheezing or shortness of breath.    . baclofen (LIORESAL) 20 MG tablet Take 1 tablet (20 mg total) by mouth 3 (three) times daily. 90 tablet 12  . hydrochlorothiazide (MICROZIDE) 12.5 MG capsule TAKE ONE CAPSULE BY MOUTH EVERY DAY 30 capsule 12  . oxybutynin (DITROPAN) 5 MG tablet Take 1 tablet (5 mg total) by mouth 3 (three) times daily. 90 tablet 12  . Tamsulosin HCl (FLOMAX) 0.4 MG CAPS Take 1 capsule (0.4 mg total) by mouth daily. Urologist to write future refills 30 capsule 0  . TECFIDERA 240 MG CPDR Take 1 capsule (240mg ) by  mouth twice a day 180 capsule 1   No  facility-administered medications prior to visit.    PAST MEDICAL HISTORY: Past Medical History  Diagnosis Date  . Multiple sclerosis   . Hypertension   . BPH (benign prostatic hyperplasia)   . Asthma   . Abnormality of gait 04/22/2013    PAST SURGICAL HISTORY: Past Surgical History  Procedure Laterality Date  . None      FAMILY HISTORY: Family History  Problem Relation Age of Onset  . Cancer  Mother     breast  . Stroke Father   . Cancer - Colon Father   . Multiple sclerosis Neg Hx     SOCIAL HISTORY: History   Social History  . Marital Status: Divorced    Spouse Name: N/A    Number of Children: 3  . Years of Education: 12   Occupational History  . diabled    Social History Main Topics  . Smoking status: Never Smoker   . Smokeless tobacco: Not on file  . Alcohol Use: No  . Drug Use: No  . Sexual Activity: Not on file   Other Topics Concern  . Not on file   Social History Narrative      PHYSICAL EXAM  Filed Vitals:   05/08/14 0917  BP: 126/83  Pulse: 98  Height:  (1.905 m)  Weight: 190 lb (86.183 kg)   Body mass index is 23.75 kg/(m^2). Generalized: Well developed, in no acute distress   Neurological examination  Mentation: Alert oriented to time, place, history taking. Follows all commands speech and language fluent. MMSE 30/30 Cranial nerve II-XII: Extraocular movements were full, visual field were full on confrontational test.  Motor: The motor testing reveals 5 over 5 strength of all upper extremities except  3/5 in lower extremities. Spasticity noted in the right leg.  Sensory: Sensory testing is intact to soft touch on all 4 extremities. No evidence of extinction is noted.  Coordination: Cerebellar testing reveals good finger-nose-finger. Difficult with heel-to-shin bilaterally.  Gait and station: patient in a motorized  scooter. Tandem gait not attempted.  Reflexes: Deep tendon reflexes are symmetric and normal bilaterally.  DIAGNOSTIC DATA (LABS, IMAGING, TESTING) - I reviewed patient records, labs, notes, testing and imaging myself where available.  Lab Results  Component Value Date   WBC 4.2 07/30/2013   HGB 14.2 07/30/2013   HCT 41.0 07/30/2013   MCV 80.2 07/30/2013   PLT 214 07/30/2013      Component Value Date/Time   NA 141 07/30/2013 0952   NA 143 04/22/2013 0829   K 3.9 07/30/2013 0952   CL 103 07/30/2013 0952     CO2 30 07/30/2013 0952   GLUCOSE 96 07/30/2013 0952   GLUCOSE 112* 04/22/2013 0829   BUN 13 07/30/2013 0952   BUN 18 04/22/2013 0829   CREATININE 0.89 07/30/2013 0952   CREATININE 0.78 04/22/2013 0829   CALCIUM 9.9 07/30/2013 0952   PROT 7.2 07/30/2013 0952   PROT 7.1 04/22/2013 0829   ALBUMIN 4.4 07/30/2013 0952   AST 21 07/30/2013 0952   ALT 26 07/30/2013 0952   ALKPHOS 66 07/30/2013 0952   BILITOT 0.6 07/30/2013 0952   GFRNONAA >89 07/30/2013 0952   GFRNONAA 103 04/22/2013 0829   GFRAA >89 07/30/2013 0952   GFRAA 119 04/22/2013 0829   Lab Results  Component Value Date   CHOL 196 07/30/2013   HDL 57 07/30/2013   LDLCALC 117* 07/30/2013   TRIG 111 07/30/2013   CHOLHDL 3.4 07/30/2013      ASSESSMENT AND PLAN  54 y.o. year old male  has a past medical history of Multiple sclerosis; Hypertension; BPH (benign prostatic hyperplasia); Asthma; and Abnormality of gait (04/22/2013). here with:  1. Multiple sclerosis 2. Abnormality of gait 3. Memory disturbance  Overall the patient has remained the same. He continues to take Tecfidera and is tolerating it well. I will check blood work today. The patient was concerned about his memory. His MMSE is 30 over 30. Depression could play a factor in his perception of memory loss. We will continue to follow his memory over time. The patient had MRI of the brain and cervical spine in July that showed no progression.If his symptoms worsen or he develops new symptoms he should let us know.Otherwise he should follow up in 6 months.  Butch Penny, MSN, NP-C 05/08/2014, 9:21 AM [facility] [address] [place], [place] [address] [phone]  Note: This document was prepared with digital dictation and possible smart phrase technology. Any transcriptional errors that result from this process are unintentional.

## 2014-05-08 NOTE — Progress Notes (Signed)
I have read the note, and I agree with the clinical assessment and plan.  Safira Proffit,Keon KEITH   

## 2014-05-08 NOTE — Patient Instructions (Addendum)
Continue tecfidera We will check blood work today.  We will follow your memory over time.

## 2014-05-09 LAB — CBC WITH DIFFERENTIAL
Basophils Absolute: 0 10*3/uL (ref 0.0–0.2)
Basos: 1 %
EOS: 5 %
Eosinophils Absolute: 0.2 10*3/uL (ref 0.0–0.4)
HCT: 40.3 % (ref 37.5–51.0)
HEMOGLOBIN: 13.7 g/dL (ref 12.6–17.7)
Immature Grans (Abs): 0 10*3/uL (ref 0.0–0.1)
Immature Granulocytes: 0 %
Lymphocytes Absolute: 1.2 10*3/uL (ref 0.7–3.1)
Lymphs: 26 %
MCH: 27.7 pg (ref 26.6–33.0)
MCHC: 34 g/dL (ref 31.5–35.7)
MCV: 82 fL (ref 79–97)
MONOS ABS: 0.5 10*3/uL (ref 0.1–0.9)
Monocytes: 11 %
NEUTROS ABS: 2.5 10*3/uL (ref 1.4–7.0)
Neutrophils Relative %: 57 %
Platelets: 228 10*3/uL (ref 150–379)
RBC: 4.94 x10E6/uL (ref 4.14–5.80)
RDW: 13.8 % (ref 12.3–15.4)
WBC: 4.5 10*3/uL (ref 3.4–10.8)

## 2014-05-09 LAB — COMPREHENSIVE METABOLIC PANEL
ALT: 27 IU/L (ref 0–44)
AST: 22 IU/L (ref 0–40)
Albumin/Globulin Ratio: 1.6 (ref 1.1–2.5)
Albumin: 4.5 g/dL (ref 3.5–5.5)
Alkaline Phosphatase: 69 IU/L (ref 39–117)
BILIRUBIN TOTAL: 0.5 mg/dL (ref 0.0–1.2)
BUN/Creatinine Ratio: 13 (ref 9–20)
BUN: 12 mg/dL (ref 6–24)
CALCIUM: 9.9 mg/dL (ref 8.7–10.2)
CO2: 24 mmol/L (ref 18–29)
CREATININE: 0.95 mg/dL (ref 0.76–1.27)
Chloride: 102 mmol/L (ref 97–108)
GFR calc Af Amer: 104 mL/min/{1.73_m2} (ref >59)
GFR calc non Af Amer: 90 mL/min/{1.73_m2} (ref >59)
GLUCOSE: 93 mg/dL (ref 65–99)
Globulin, Total: 2.9 g/dL (ref 1.5–4.5)
Potassium: 4.3 mmol/L (ref 3.5–5.2)
Sodium: 145 mmol/L — ABNORMAL HIGH (ref 134–144)
Total Protein: 7.4 g/dL (ref 6.0–8.5)

## 2014-06-30 ENCOUNTER — Encounter (HOSPITAL_BASED_OUTPATIENT_CLINIC_OR_DEPARTMENT_OTHER): Payer: Self-pay

## 2014-06-30 ENCOUNTER — Emergency Department (HOSPITAL_BASED_OUTPATIENT_CLINIC_OR_DEPARTMENT_OTHER)
Admission: EM | Admit: 2014-06-30 | Discharge: 2014-06-30 | Disposition: A | Discharge status: Home / Self Care | Payer: 59 | Attending: Emergency Medicine | Admitting: Emergency Medicine

## 2014-06-30 DIAGNOSIS — N3000 Acute cystitis without hematuria: Secondary | ICD-10-CM | POA: Insufficient documentation

## 2014-06-30 DIAGNOSIS — J45909 Unspecified asthma, uncomplicated: Secondary | ICD-10-CM | POA: Diagnosis not present

## 2014-06-30 DIAGNOSIS — G35 Multiple sclerosis: Secondary | ICD-10-CM | POA: Diagnosis not present

## 2014-06-30 DIAGNOSIS — I10 Essential (primary) hypertension: Secondary | ICD-10-CM | POA: Diagnosis not present

## 2014-06-30 DIAGNOSIS — N400 Enlarged prostate without lower urinary tract symptoms: Secondary | ICD-10-CM | POA: Diagnosis not present

## 2014-06-30 DIAGNOSIS — R339 Retention of urine, unspecified: Secondary | ICD-10-CM | POA: Diagnosis present

## 2014-06-30 DIAGNOSIS — Z79899 Other long term (current) drug therapy: Secondary | ICD-10-CM | POA: Diagnosis not present

## 2014-06-30 DIAGNOSIS — R338 Other retention of urine: Secondary | ICD-10-CM

## 2014-06-30 LAB — URINALYSIS, ROUTINE W REFLEX MICROSCOPIC
Bilirubin Urine: NEGATIVE
GLUCOSE, UA: NEGATIVE mg/dL
Ketones, ur: NEGATIVE mg/dL
NITRITE: NEGATIVE
Protein, ur: NEGATIVE mg/dL
SPECIFIC GRAVITY, URINE: 1.012 (ref 1.005–1.030)
UROBILINOGEN UA: 1 mg/dL (ref 0.0–1.0)
pH: 6.5 (ref 5.0–8.0)

## 2014-06-30 LAB — URINE MICROSCOPIC-ADD ON

## 2014-06-30 MED ORDER — CIPROFLOXACIN HCL 500 MG PO TABS: 500.0000 mg | ORAL_TABLET | Freq: Two times a day (BID) | ORAL | Status: DC

## 2014-06-30 MED ORDER — CIPROFLOXACIN HCL 500 MG PO TABS
500.0000 mg | ORAL_TABLET | Freq: Once | ORAL | Status: AC
  Administered 2014-06-30: 500 mg via ORAL
  Filled 2014-06-30: qty 1

## 2014-06-30 NOTE — ED Provider Notes (Signed)
CSN: [id number]     Arrival date & time 06/30/14  0410 History   First MD Initiated Contact with Patient 06/30/14 0434     Chief Complaint  Patient presents with  . Urinary Retention     (Consider location/radiation/quality/duration/timing/severity/associated sxs/prior Treatment) HPI  This is a 54 year old male with history of multiple sclerosis and neurogenic bladder. He has not been able to urinate since about 6:30 PM yesterday evening. He states his bladder feels distended. He is in moderate to severe discomfort. He attempted to catheterize himself twice without success. He also slid to the floor while in the bathroom and was able unable to get himself up. He contacted his brother who brought him to the ED. He denies injury. He has had similar episodes as a result of his multiple sclerosis but lives alone and is usually able to take care of himself.   Past Medical History  Diagnosis Date  . Multiple sclerosis   . Hypertension   . BPH (benign prostatic hyperplasia)   . Asthma   . Abnormality of gait 04/22/2013   Past Surgical History  Procedure Laterality Date  . None     Family History  Problem Relation Age of Onset  . Cancer Mother     breast  . Stroke Father   . Cancer - Colon Father   . Multiple sclerosis Neg Hx    History  Substance Use Topics  . Smoking status: Never Smoker   . Smokeless tobacco: Not on file  . Alcohol Use: No    Review of Systems  All other systems reviewed and are negative.   Allergies  Peanut-containing drug products and Zanaflex  Home Medications   Prior to Admission medications   Medication Sig Start Date End Date Taking? Authorizing Provider  albuterol (PROVENTIL HFA;VENTOLIN HFA) 108 (90 BASE) MCG/ACT inhaler Inhale 2 puffs into the lungs every 6 (six) hours as needed for wheezing or shortness of breath.    Historical Provider, MD  baclofen (LIORESAL) 20 MG tablet Take 1 tablet (20 mg total) by mouth 3 (three) times daily. 07/25/13    Carmelina Dane, MD  hydrochlorothiazide (MICROZIDE) 12.5 MG capsule TAKE ONE CAPSULE BY MOUTH EVERY DAY 07/25/13   Carmelina Dane, MD  oxybutynin (DITROPAN) 5 MG tablet Take 1 tablet (5 mg total) by mouth 3 (three) times daily. 07/25/13   Carmelina Dane, MD  Tamsulosin HCl (FLOMAX) 0.4 MG CAPS Take 1 capsule (0.4 mg total) by mouth daily. Urologist to write future refills 12/11/11   Nelva Nay, PA-C  TECFIDERA 240 MG CPDR Take 1 capsule (240mg ) by  mouth twice a day 03/30/14   York Spaniel, MD   BP 148/83 mmHg  Pulse 98  Temp(Src) 98.6 F (37 C) (Oral)  Resp 18  Ht 6\' 3"  (1.905 m)  Wt 200 lb (90.719 kg)  BMI 25.00 kg/m2  SpO2 98%   Physical Exam  General: Well-developed, well-nourished male in no acute distress; appearance consistent with age of record HENT: normocephalic; atraumatic Eyes: pupils equal, round and reactive to light; extraocular muscles intact; arcus senilis bilaterally Neck: supple Heart: regular rate and rhythm Lungs: clear to auscultation bilaterally Abdomen: soft; nondistended; distended bladder, palpation reproduces urge to urinate; bowel sounds present Extremities: No deformity; full range of motion; pulses normal Neurologic: Awake, alert and oriented; motor function intact in all extremities but with generalized weakness; no facial droop Skin: Warm and dry Psychiatric: Normal mood and affect    ED Course  Procedures (including critical care time)   MDM  Nursing notes and vitals signs, including pulse oximetry, reviewed.  Summary of this visit's results, reviewed by myself:  Labs:  Results for orders placed or performed during the hospital encounter of 06/30/14 (from the past 24 hour(s))  Urinalysis, Routine w reflex microscopic     Status: Abnormal   Collection Time: 06/30/14  4:31 AM  Result Value Ref Range   Color, Urine YELLOW YELLOW   APPearance CLEAR CLEAR   Specific Gravity, Urine 1.012 1.005 - 1.030   pH 6.5 5.0 - 8.0    Glucose, UA NEGATIVE NEGATIVE mg/dL   Hgb urine dipstick TRACE (A) NEGATIVE   Bilirubin Urine NEGATIVE NEGATIVE   Ketones, ur NEGATIVE NEGATIVE mg/dL   Protein, ur NEGATIVE NEGATIVE mg/dL   Urobilinogen, UA 1.0 0.0 - 1.0 mg/dL   Nitrite NEGATIVE NEGATIVE   Leukocytes, UA SMALL (A) NEGATIVE  Urine microscopic-add on     Status: None   Collection Time: 06/30/14  4:31 AM  Result Value Ref Range   Squamous Epithelial / LPF RARE RARE   WBC, UA 7-10 <3 WBC/hpf   RBC / HPF 0-2 <3 RBC/hpf   Bacteria, UA RARE RARE      Hanley Seamen, MD 06/30/14 [phone]

## 2014-06-30 NOTE — ED Notes (Signed)
Pt states he hasn't been able to urinate since 1830 yesterday, states attempted to cath himself twice but unsuccessfully. Distended bladder with tenderness on palpation

## 2014-07-01 ENCOUNTER — Ambulatory Visit (INDEPENDENT_AMBULATORY_CARE_PROVIDER_SITE_OTHER): Payer: 59 | Admitting: Internal Medicine

## 2014-07-01 ENCOUNTER — Encounter: Payer: Self-pay | Admitting: Internal Medicine

## 2014-07-01 ENCOUNTER — Telehealth: Payer: Self-pay | Admitting: Adult Health

## 2014-07-01 VITALS — BP 142/85 | HR 88 | Temp 98.2°F | Resp 16 | Wt 202.6 lb

## 2014-07-01 DIAGNOSIS — G35 Multiple sclerosis: Secondary | ICD-10-CM

## 2014-07-01 DIAGNOSIS — R339 Retention of urine, unspecified: Secondary | ICD-10-CM

## 2014-07-01 MED ORDER — BISACODYL 5 MG PO TBEC: DELAYED_RELEASE_TABLET | ORAL | Status: DC

## 2014-07-01 MED ORDER — CIPROFLOXACIN HCL 500 MG PO TABS: 500.0000 mg | ORAL_TABLET | Freq: Two times a day (BID) | ORAL | Status: DC

## 2014-07-01 MED ORDER — POLYETHYLENE GLYCOL 3350 17 GM/SCOOP PO POWD: 17.0000 g | Freq: Two times a day (BID) | ORAL | Status: DC | PRN

## 2014-07-01 MED ORDER — TAMSULOSIN HCL 0.4 MG PO CAPS: 0.4000 mg | ORAL_CAPSULE | Freq: Every day | ORAL | Status: DC

## 2014-07-01 NOTE — Telephone Encounter (Signed)
Patient stated Prior Authorization was needed for Rx TECFIDERA 240 MG CPDR.  Please call and advise.

## 2014-07-01 NOTE — Telephone Encounter (Signed)
Ins has been contacted and provided with clinical info.  Request is currently under review.  I called the patient back.  He is aware.

## 2014-07-02 ENCOUNTER — Telehealth: Payer: Self-pay | Admitting: Adult Health

## 2014-07-02 LAB — URINE CULTURE
COLONY COUNT: NO GROWTH
Culture: NO GROWTH

## 2014-07-02 NOTE — Progress Notes (Signed)
Subjective:    Patient ID: Randy Shaw, male    DOB: July 05, 1960, 54 y.o.   MRN: [id number]  HPI he presented to the emergency room 36 hours ago with acute urinary retention having failed at self-catheterization which had presented over the prior 12-24 hours. 940 mL were drained and he was sent home with indwelling catheter. He has a history of multiple sclerosis and benign prostate hypertrophy. He has had 1 prior percent like this more than year ago which was attributed to prosthetic infection and he was able to discontinue the catheter easily. The last time he's had any urinary retention was over a year ago. He's not had fever dysuria frequency urgency over the past week. He has noted no bowel movement in the last for 5 days. It is not typical for him to be constipated. He was started on Cipro at the emergency room yesterday with his first dose around 6 AM but his prescription was sent to mail order and he's had no medicine since. The urinalysis had minimal signs of infection and the culture still pending.  His MS has been stable for the most part but he has noticed an increased frequency of vision blurring over the last 2 months. His medications have not changed. Notably he continues on baclofen and tecfidera per Dr Anne Hahn. He rarely makes any attempt to walk anymore.  He has been on Flomax and did trip and in the past but has not been on these in a long time.   Review of Systems No fever chills or night sweats No nausea or vomiting No abdominal pain No peripheral edema    Objective:   Physical Exam BP 142/85 mmHg  Pulse 88  Temp(Src) 98.2 F (36.8 C) (Oral)  Resp 16  Wt 202 lb 9.6 oz (91.899 kg)  SpO2 99% He appears in no acute distress No flank tenderness No abdominal tenderness Alert and oriented Wheelchair bound     Assessment & Plan:  Acute urinary retention--etiology uncertain -History of BPH -Recent constipation -Multiple sclerosis -On baclofen -Possible  infection  Given that none of the above factors have been improved since yesterday, the catheter will remain in place and we will treat aggressively for constipation and infection and remove the catheter on Friday. I would prefer not to have this in place for more than these 3 days if at all possible based on my understanding of the current literature  Meds ordered this encounter  Medications  . DISCONTD: ciprofloxacin (CIPRO) 500 MG tablet    Sig: Take 1 tablet (500 mg total) by mouth 2 (two) times daily. One po bid    Dispense:  6 tablet    Refill:  0  . tamsulosin (FLOMAX) 0.4 MG CAPS capsule    Sig: Take 1 capsule (0.4 mg total) by mouth daily.    Dispense:  30 capsule    Refill:  0  . polyethylene glycol powder (GLYCOLAX/MIRALAX) powder    Sig: Take 17 g by mouth 2 (two) times daily as needed.    Dispense:  225 g    Refill:  1  . bisacodyl (DULCOLAX) 5 MG EC tablet    Sig: 1 tab every 12 hrs until has BM    Dispense:  10 tablet    Refill:  0  . ciprofloxacin (CIPRO) 500 MG tablet----he is awaiting another 14 pills sent by ER to his mail order pharmacy     Sig: Take 1 tablet (500 mg total) by mouth 2 (two)  times daily. One po bid    Dispense:  6 tablet    Refill:  0   Follow-up on 07/03/2014 for catheter removal

## 2014-07-02 NOTE — Telephone Encounter (Signed)
I called back.  Spoke with [employer].  He has noted patient file.

## 2014-07-02 NOTE — Telephone Encounter (Signed)
Randy Shaw with [facility] @ [phone], option 2, stated he received Prior authorization for Rx TECFIDERA 240 MG CPDR.  He's noticed patient's an established user, therefore questioning if PA is for a starter pack or 240 mg TECFIDERA.  Please call and advise.

## 2014-07-02 NOTE — Telephone Encounter (Signed)
Patient has been on the 240 mg Tecfidera. Shanda Bumps can you call and let them know.

## 2014-07-03 ENCOUNTER — Telehealth: Payer: Self-pay

## 2014-07-03 ENCOUNTER — Ambulatory Visit (INDEPENDENT_AMBULATORY_CARE_PROVIDER_SITE_OTHER): Payer: 59 | Admitting: Internal Medicine

## 2014-07-03 VITALS — BP 122/76 | HR 94 | Temp 98.1°F | Resp 18

## 2014-07-03 DIAGNOSIS — R339 Retention of urine, unspecified: Secondary | ICD-10-CM | POA: Diagnosis not present

## 2014-07-03 DIAGNOSIS — G35 Multiple sclerosis: Secondary | ICD-10-CM

## 2014-07-03 NOTE — Patient Instructions (Signed)
Take your usual dose of Flomax (tamsulosin) with a meal--- if you have not urinated by 8 or 9:00 take a second dose Go ahead and finish the Cipro tomorrow

## 2014-07-03 NOTE — Telephone Encounter (Signed)
[facility] ([facility]) has approved the request for coverage on Tecfidera effective until 07/02/2019 or until the policy changes or is terminated Ref # [id number]

## 2014-07-03 NOTE — Progress Notes (Signed)
Subjective:    Patient ID: Randy Shaw, male    DOB: 1960-12-02, 54 y.o.   MRN: [id number] This chart was scribed for Randy Sia, MD by Chestine Spore, ED Scribe. The patient was seen in room 11 at 10:57 AM.   Chief Complaint  Patient presents with  . Follow-up    cath     HPI  HPI Comments: Randy Shaw is a 54 y.o. male with a medical hx of MS, HTN, BPH with urinary obstruction who presents to the Emergency Department complaining of F/U for the removal of his catheter. Pt went to the ED on 06/30/14 after he failed to self-catherizebegan to have painful urinary retention. He had 940 mL was drained and he was sent home with an indwelling catheter. At follow-up on 3/2 we left the catheter in place to continue Cipro awaiting culture results and to clear up the constipation that he described. He has responded to MiraLAX and Dulcolax and his had several bowel movements.  Pt has had an episode like this before 1 year ago that was attributed to a prostate infection, and he was able to d/c the catheter with ease after treatment. No problems with retention sense. Pt has one cipro left from the prescription we gave him and when he called the pharmacy they reported that they didn't have any record of the Rx from ER.    Pt just saw Dr. Anne Hahn in [facility] felt his MS was stable. Mr. Cowley has noticed increased blurred vision over the past few months.7/15 MRI Multiple supratentorial and infratentorial chronic demyelinating plaques with Multiple chronic demyelinating plaques from C2 down to C6 levels.  Urine culture from the emergency room was negative  Patient Active Problem List   Diagnosis Date Noted  . Abnormality of gait 04/22/2013  . HTN (hypertension) 06/29/2012  . Multiple sclerosis 06/29/2012  . BPH (benign prostatic hypertrophy) with urinary obstruction 06/29/2012   Past Medical History  Diagnosis Date  . Multiple sclerosis   . Hypertension   . BPH (benign prostatic  hyperplasia)   . Asthma------ no recent problems    . Abnormality of gait 04/22/2013   Past Surgical History  Procedure Laterality Date  . None     Allergies  Allergen Reactions  . Peanut-Containing Drug Products Shortness Of Breath  . Zanaflex [Tizanidine Hcl] Shortness Of Breath   Prior to Admission medications   Medication Sig Start Date End Date Taking? Authorizing Provider  albuterol (PROVENTIL HFA;VENTOLIN HFA) 108 (90 BASE) MCG/ACT inhaler Inhale 2 puffs into the lungs every 6 (six) hours as needed for wheezing or shortness of breath.    Historical Provider, MD  baclofen (LIORESAL) 20 MG tablet Take 1 tablet (20 mg total) by mouth 3 (three) times daily. 07/25/13   Carmelina Dane, MD  bisacodyl (DULCOLAX) 5 MG EC tablet 1 tab every 12 hrs until has BM 07/01/14   Tonye Pearson, MD  ciprofloxacin (CIPRO) 500 MG tablet Take 1 tablet (500 mg total) by mouth 2 (two) times daily. One po bid 07/01/14   Tonye Pearson, MD  hydrochlorothiazide (MICROZIDE) 12.5 MG capsule TAKE ONE CAPSULE BY MOUTH EVERY DAY 07/25/13   Carmelina Dane, MD  oxybutynin (DITROPAN) 5 MG tablet Take 1 tablet (5 mg total) by mouth 3 (three) times daily. 07/25/13   Carmelina Dane, MD  polyethylene glycol powder (GLYCOLAX/MIRALAX) powder Take 17 g by mouth 2 (two) times daily as needed. 07/01/14   Tonye Pearson, MD  tamsulosin (FLOMAX) 0.4 MG CAPS capsule Take 1 capsule (0.4 mg total) by mouth daily. 07/01/14   Tonye Pearson, MD  TECFIDERA 240 MG CPDR Take 1 capsule ( ) by  mouth twice a day 03/30/14   York Spaniel, MD      Review of Systems  Constitutional: Negative for fever and chills.  Gastrointestinal: Negative for constipation.  Genitourinary: Negative for difficulty urinating.       Objective:   Physical Exam  Constitutional: He is oriented to person, place, and time. He appears well-developed and well-nourished. No distress.  HENT:  Head: Normocephalic and atraumatic.    Eyes: EOM are normal.  Neck: Neck supple.  Cardiovascular: Normal rate.   Pulmonary/Chest: Effort normal. No respiratory distress.  Genitourinary:  Catheter in place without any skin lesions. Bag is full with clear urine.    Musculoskeletal: Normal range of motion.  Neurological: He is alert and oriented to person, place, and time.  Skin: Skin is warm and dry.  Psychiatric: He has a normal mood and affect. His behavior is normal.  Nursing note and vitals reviewed.  Procedure= after deflating the bulb the catheter was removed with ease     BP 122/76 mmHg  Pulse 94  Temp(Src) 98.1 F (36.7 C) (Oral)  Resp 18  SpO2 99%  Assessment & Plan:   I have completed the patient encounter in its entirety as documented by the scribe, with editing by me where necessary. Jaan Fischel P. Merla Riches, M.D.  Acute urinary retention hopefully secondary to constipation but with several other potential factors He will continue Flomax but may discontinue Cipro since the culture was negative He will return at once for recatheterization if necessary

## 2014-07-15 ENCOUNTER — Telehealth: Payer: Self-pay | Admitting: Neurology

## 2014-07-15 NOTE — Telephone Encounter (Signed)
I spoke to the patient about the [employer] research trial. I introduced the trial to the patient, and the patient expressed interest. I will mailed the ICF today for the patient to review, and I will check with the patient in a week. °

## 2014-07-17 ENCOUNTER — Other Ambulatory Visit: Payer: Self-pay | Admitting: Internal Medicine

## 2014-07-27 ENCOUNTER — Other Ambulatory Visit: Payer: Self-pay | Admitting: Emergency Medicine

## 2014-08-06 ENCOUNTER — Ambulatory Visit (INDEPENDENT_AMBULATORY_CARE_PROVIDER_SITE_OTHER): Payer: Self-pay | Admitting: Neurology

## 2014-08-06 VITALS — BP 134/78 | HR 80 | Temp 97.6°F | Resp 16 | Ht 75.0 in | Wt 197.4 lb

## 2014-08-06 DIAGNOSIS — R269 Unspecified abnormalities of gait and mobility: Secondary | ICD-10-CM

## 2014-08-06 DIAGNOSIS — R413 Other amnesia: Secondary | ICD-10-CM

## 2014-08-06 DIAGNOSIS — G35 Multiple sclerosis: Secondary | ICD-10-CM

## 2014-08-06 MED ORDER — OXYBUTYNIN CHLORIDE 5 MG PO TABS: 5.0000 mg | ORAL_TABLET | Freq: Two times a day (BID) | ORAL | Status: DC

## 2014-08-06 NOTE — Progress Notes (Signed)
PATIENT: Randy Shaw DOB: 1960/08/31  [name] is a 54 year old male RH with a history of multiple sclerosis and a gait disorder.       The patient is currently taking tecfidera and is tolerating it well. The patient takes baclofen for spasticity and feels that it has beneficial. Patient states that he forgot to take one of his doses of baclofen and actually fell because of the "stiffness." . He is taking Baclofen 20mg  tid for few years, compliance with his medications.  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  He also has double vision.  He was treated with Avonex in 1995 for one year, was switched to betaseron from 1996 till 2015, when he started to take Tecfidra.  Last flare up was around 2005.  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 The patient uses a wheelchair primarily since 2005. The patient is able to ambulate using a walker but primarily uses the scooter. Patient does report some double vision that has been ongoing. Patient does have some urgency with urination.   Patient is unsure if the oxybutynin has been beneficial. Denies problems with bowels.   He lives alone at home, no longer driving, able to transfer himself in and out of his scooter.   REVIEW OF SYSTEMS: Out of a complete 14 system review of symptoms, the patient complains only of the following symptoms, and all other reviewed systems are negative.  Ringing in ears, double vision, flushing, insomnia, frequent waking, and frequency of urination, urinary decreased, walking difficulty, speech difficulty, weakness, decreased concentration, depression  ALLERGIES: Allergies  Allergen Reactions  . Peanut-Containing Drug Products Shortness Of Breath  . Zanaflex [Tizanidine Hcl] Shortness Of Breath    HOME MEDICATIONS: Outpatient Prescriptions Prior to Visit  Medication Sig  Dispense Refill  . albuterol (PROVENTIL HFA;VENTOLIN HFA) 108 (90 BASE) MCG/ACT inhaler Inhale 2 puffs into the lungs every 6 (six) hours as needed for wheezing or shortness of breath.    . baclofen (LIORESAL) 20 MG tablet TAKE ONE TABLET BY MOUTH THREE TIMES DAILY 90 tablet 5  . bisacodyl (DULCOLAX) 5 MG EC tablet 1 tab every 12 hrs until has BM 10 tablet 0  . ciprofloxacin (CIPRO) 500 MG tablet Take 1 tablet (500 mg total) by mouth 2 (two) times daily. One po bid 6 tablet 0  . hydrochlorothiazide (MICROZIDE) 12.5 MG capsule TAKE ONE CAPSULE BY MOUTH EVERY DAY 30 capsule 12  . oxybutynin (DITROPAN) 5 MG tablet Take 1 tablet (5 mg total) by mouth 3 (three) times daily. 90 tablet 12  . polyethylene glycol powder (GLYCOLAX/MIRALAX) powder Take 17 g by mouth 2 (two) times daily as needed. 225 g 1  . tamsulosin (FLOMAX) 0.4 MG CAPS capsule Take 1 capsule (0.4 mg total) by mouth daily. (Patient not taking: Reported on 07/03/2014) 30 capsule 0  . TECFIDERA 240 MG CPDR Take 1 capsule (240mg ) by  mouth twice a day 180 capsule 1   No facility-administered medications prior to visit.    PAST MEDICAL HISTORY: Past Medical History  Diagnosis Date  . Multiple sclerosis   . Hypertension   . BPH (benign prostatic hyperplasia)   . Asthma   . Abnormality of gait 04/22/2013    PAST SURGICAL HISTORY: Past Surgical History  Procedure Laterality Date  . None      FAMILY  HISTORY: Family History  Problem Relation Age of Onset  . Cancer Mother     breast  . Stroke Father   . Cancer - Colon Father   . Multiple sclerosis Neg Hx     SOCIAL HISTORY: History   Social History  . Marital Status: Divorced    Spouse Name: N/A  . Number of Children: 3  . Years of Education: 12   Occupational History  . diabled    Social History Main Topics  . Smoking status: Never Smoker   . Smokeless tobacco: Not on file  . Alcohol Use: No  . Drug Use: No  . Sexual Activity: Not on file   Other Topics Concern   . Not on file   Social History Narrative      PHYSICAL EXAM  There were no vitals filed for this visit. There is no weight on file to calculate BMI. Generalized: Well developed, in no acute distress   Neurological examination  Mentation: Alert oriented to time, place, history taking. Follows all commands speech and language fluent.  Cranial nerve II-XII: bilateral abductor limitations Motor: The motor testing reveals 5 over 5 strength of all upper extremity, moderate bilateral lower extremity spasticity, proximal and distal weakness,right 3+ more than left 4,  Sensory: Sensory testing is intact to soft touch on all 4 extremities. No evidence of extinction is noted.  Coordination: Cerebellar testing reveals good finger-nose-finger. Difficult with heel-to-shin bilaterally.  Gait and station: patient in a motorized  scooter. Tandem gait not attempted.  Reflexes: Deep tendon reflexes are hypereflexia and symmetric,plantar response were flexor  DIAGNOSTIC DATA (LABS, IMAGING, TESTING) - I reviewed patient records, labs, notes, testing and imaging myself where available.  Lab Results  Component Value Date   WBC 4.5 05/08/2014   HGB 13.7 05/08/2014   HCT 40.3 05/08/2014   MCV 82 05/08/2014   PLT 228 05/08/2014      Component Value Date/Time   NA 145* 05/08/2014 0951   NA 141 07/30/2013 0952   K 4.3 05/08/2014 0951   CL 102 05/08/2014 0951   CO2 24 05/08/2014 0951   GLUCOSE 93 05/08/2014 0951   GLUCOSE 96 07/30/2013 0952   BUN 12 05/08/2014 0951   BUN 13 07/30/2013 0952   CREATININE 0.95 05/08/2014 0951   CREATININE 0.89 07/30/2013 0952   CALCIUM 9.9 05/08/2014 0951   PROT 7.4 05/08/2014 0951   PROT 7.2 07/30/2013 0952   ALBUMIN 4.4 07/30/2013 0952   AST 22 05/08/2014 0951   ALT 27 05/08/2014 0951   ALKPHOS 69 05/08/2014 0951   BILITOT 0.5 05/08/2014 0951   GFRNONAA 90 05/08/2014 0951   GFRNONAA >89 07/30/2013 0952   GFRAA 104 05/08/2014 0951   GFRAA >89  07/30/2013 0952   Lab Results  Component Value Date   CHOL 196 07/30/2013   HDL 57 07/30/2013   LDLCALC 117* 07/30/2013   TRIG 111 07/30/2013   CHOLHDL 3.4 07/30/2013      ASSESSMENT AND PLAN 54 y.o. year old male  has a past medical history of Multiple sclerosis, significant gait difficulty, bilateral lower extremity spasticity,   1. Tecfidera  2. Baclofen  tid, candidate for [employer] trial  Levert Feinstein, M.D. Ph.D.  [facility] [address] [place], [place] [address] Phone: [phone] Fax:      [phone]

## 2014-08-06 NOTE — Progress Notes (Signed)
Randy Shaw (DOB: June 30, 1960) was seen today 07APR2016 for Screening Visit for the [facility] [id number] Research Trial (A Placebo-Controlled Randomized Withdrawal Evaluation of the Efficacy and Safety of Baclofen ER Capsules (GRS) In Subjects with Spasticity due to Multiple Sclerosis).  Patient was provided ample time to read, review, and understand the Informed Consent Form (ICF). The patient was also given time to ask questions and have those addressed. Then, the ICF (Version Date: 07SEP2012) was signed at 8:55h on 07APR2016. [place] assessment was performed by [employer].  Principal Investigator, Dr. Levert Feinstein, reviewed with the patient the following patient's history:  MS History  Spasticity History  MS symptoms other than spasticity   Baclofen History (Baclofen IR  t.i.d. Since 01JAN2005)  Medical History  Moreover, Dr. Terrace Arabia performed a complete physical and neurological examinations at 11:13h. Vital signs were non-clinically-significant. Clinical Laboratory tests and Pharmacokinetic Blood sampling were collected at 09:00h. C-SSRS was performed by Research Coordinator [facility] and exhibited no significant findings. An EKG was performed during the visit in triplicate at two minute intervals: 09:16h, 09:18h, and 09:20h.  Dr. Terrace Arabia reviewed inclusion and exclusion criteria and concluded that the subject qualifies to continue in the study. Finally, patient's Visit 1 (Week 1) was scheduled for Wednesday, 13APR2016 at 10:00h.

## 2014-08-08 ENCOUNTER — Ambulatory Visit (INDEPENDENT_AMBULATORY_CARE_PROVIDER_SITE_OTHER): Payer: 59 | Admitting: Family Medicine

## 2014-08-08 ENCOUNTER — Emergency Department (HOSPITAL_BASED_OUTPATIENT_CLINIC_OR_DEPARTMENT_OTHER)
Admission: EM | Admit: 2014-08-08 | Discharge: 2014-08-08 | Disposition: A | Discharge status: Home / Self Care | Payer: 59 | Attending: Emergency Medicine | Admitting: Emergency Medicine

## 2014-08-08 ENCOUNTER — Encounter (HOSPITAL_BASED_OUTPATIENT_CLINIC_OR_DEPARTMENT_OTHER): Payer: Self-pay | Admitting: *Deleted

## 2014-08-08 VITALS — BP 144/94 | HR 95 | Temp 98.5°F | Resp 17

## 2014-08-08 DIAGNOSIS — Z8669 Personal history of other diseases of the nervous system and sense organs: Secondary | ICD-10-CM | POA: Diagnosis not present

## 2014-08-08 DIAGNOSIS — N138 Other obstructive and reflux uropathy: Secondary | ICD-10-CM

## 2014-08-08 DIAGNOSIS — Z8739 Personal history of other diseases of the musculoskeletal system and connective tissue: Secondary | ICD-10-CM | POA: Diagnosis not present

## 2014-08-08 DIAGNOSIS — R339 Retention of urine, unspecified: Secondary | ICD-10-CM | POA: Diagnosis not present

## 2014-08-08 DIAGNOSIS — J45909 Unspecified asthma, uncomplicated: Secondary | ICD-10-CM | POA: Diagnosis not present

## 2014-08-08 DIAGNOSIS — I10 Essential (primary) hypertension: Secondary | ICD-10-CM | POA: Diagnosis not present

## 2014-08-08 DIAGNOSIS — G35 Multiple sclerosis: Secondary | ICD-10-CM | POA: Diagnosis not present

## 2014-08-08 DIAGNOSIS — N401 Enlarged prostate with lower urinary tract symptoms: Secondary | ICD-10-CM

## 2014-08-08 DIAGNOSIS — K5901 Slow transit constipation: Secondary | ICD-10-CM | POA: Diagnosis not present

## 2014-08-08 LAB — URINALYSIS, ROUTINE W REFLEX MICROSCOPIC
Bilirubin Urine: NEGATIVE
Glucose, UA: NEGATIVE mg/dL
HGB URINE DIPSTICK: NEGATIVE
Ketones, ur: 15 mg/dL — AB
LEUKOCYTES UA: NEGATIVE
Nitrite: NEGATIVE
PROTEIN: NEGATIVE mg/dL
Specific Gravity, Urine: 1.019 (ref 1.005–1.030)
UROBILINOGEN UA: 0.2 mg/dL (ref 0.0–1.0)
pH: 6 (ref 5.0–8.0)

## 2014-08-08 MED ORDER — CIPROFLOXACIN HCL 500 MG PO TABS: 500.0000 mg | ORAL_TABLET | Freq: Two times a day (BID) | ORAL | Status: DC

## 2014-08-08 NOTE — ED Provider Notes (Signed)
CSN: [id number]     Arrival date & time 08/08/14  1041 History   First MD Initiated Contact with Patient 08/08/14 1114     Chief Complaint  Patient presents with  . Urinary Retention     (Consider location/radiation/quality/duration/timing/severity/associated sxs/prior Treatment) HPI Comments: Patient presents with urinary retention. He has a history of MS and is had problems with urinary retention before. He also has BPH. He was here about a month ago and had a Foley catheter placed. This was in place about 2 or 3 days and was removed by his primary care physician. He states he's been doing well since that time he hasn't been able to piece since yesterday. He has a lot of pressure to his lower abdomen. He denies any nausea or vomiting. He denies any fevers. He did do self catheterizations in the past but is not currently have the equipment to do self catheterizations at home currently. He has also followed by Dr. Annabell Howells with [facility].   Past Medical History  Diagnosis Date  . Multiple sclerosis   . Hypertension   . BPH (benign prostatic hyperplasia)   . Asthma   . Abnormality of gait 04/22/2013   Past Surgical History  Procedure Laterality Date  . None     Family History  Problem Relation Age of Onset  . Cancer Mother     breast  . Stroke Father   . Cancer - Colon Father   . Multiple sclerosis Neg Hx    History  Substance Use Topics  . Smoking status: Never Smoker   . Smokeless tobacco: Not on file  . Alcohol Use: Yes     Comment: occasionally    Review of Systems  Constitutional: Negative for fever, chills, diaphoresis and fatigue.  HENT: Negative for congestion, rhinorrhea and sneezing.   Eyes: Negative.   Respiratory: Negative for cough, chest tightness and shortness of breath.   Cardiovascular: Negative for chest pain and leg swelling.  Gastrointestinal: Positive for abdominal pain and abdominal distention. Negative for nausea, vomiting, diarrhea and blood in  stool.  Genitourinary: Positive for decreased urine volume. Negative for frequency, hematuria, flank pain and difficulty urinating.  Musculoskeletal: Negative for back pain and arthralgias.  Skin: Negative for rash.  Neurological: Negative for dizziness, speech difficulty, weakness, numbness and headaches.      Allergies  Peanut-containing drug products and Zanaflex  Home Medications   Prior to Admission medications   Medication Sig Start Date End Date Taking? Authorizing Provider  baclofen (LIORESAL) 20 MG tablet TAKE ONE TABLET BY MOUTH THREE TIMES DAILY 07/28/14  Yes Tonye Pearson, MD  hydrochlorothiazide (MICROZIDE) 12.5 MG capsule TAKE ONE CAPSULE BY MOUTH EVERY DAY 07/25/13  Yes Carmelina Dane, MD  oxybutynin (DITROPAN) 5 MG tablet Take 1 tablet (5 mg total) by mouth 2 (two) times daily. 08/06/14  Yes Levert Feinstein, MD  polyethylene glycol powder (GLYCOLAX/MIRALAX) powder Take 17 g by mouth 2 (two) times daily as needed. 07/01/14  Yes Tonye Pearson, MD  tamsulosin (FLOMAX) 0.4 MG CAPS capsule Take 1 capsule (0.4 mg total) by mouth daily. 07/01/14  Yes Tonye Pearson, MD  TECFIDERA 240 MG CPDR Take 1 capsule ( ) by  mouth twice a day 03/30/14  Yes York Spaniel, MD  ciprofloxacin (CIPRO) 500 MG tablet Take 1 tablet (500 mg total) by mouth 2 (two) times daily. One po bid x 7 days 08/08/14   Rolan Bucco, MD   BP 135/98 mmHg  Pulse 116  Temp(Src) 98.8 F (37.1 C) (Oral)  Resp 16  Ht 6\' 3"  (1.905 m)  Wt 200 lb (90.719 kg)  BMI 25.00 kg/m2  SpO2 97% Physical Exam  Constitutional: He is oriented to person, place, and time. He appears well-developed and well-nourished.  HENT:  Head: Normocephalic and atraumatic.  Eyes: Pupils are equal, round, and reactive to light.  Neck: Normal range of motion. Neck supple.  Cardiovascular: Normal rate, regular rhythm and normal heart sounds.   Pulmonary/Chest: Effort normal and breath sounds normal. No respiratory distress. He  has no wheezes. He has no rales. He exhibits no tenderness.  Abdominal: Soft. Bowel sounds are normal. There is tenderness (+tenderness and swelling to lower abdomen). There is no rebound and no guarding.  Musculoskeletal: Normal range of motion. He exhibits no edema.  Lymphadenopathy:    He has no cervical adenopathy.  Neurological: He is alert and oriented to person, place, and time.  Skin: Skin is warm and dry. No rash noted.  Psychiatric: He has a normal mood and affect.    ED Course  Procedures (including critical care time) Labs Review Labs Reviewed  URINALYSIS, ROUTINE W REFLEX MICROSCOPIC - Abnormal; Notable for the following:    Ketones, ur 15 (*)    All other components within normal limits  URINE CULTURE    Imaging Review No results found.   EKG Interpretation None      MDM   Final diagnoses:  Urinary retention    A Foley catheter was placed which produced over thousand cc of clear urine. Patient is discharged home with a leg bag. He was feeling much better after the catheter placement. I encouraged him to follow-up with his urologist within the next week.    Rolan Bucco, MD 08/08/14 [phone]

## 2014-08-08 NOTE — Discharge Instructions (Signed)
Acute Urinary Retention °Acute urinary retention is the temporary inability to urinate. °This is a common problem in older men. As men age their prostates become larger and block the flow of urine from the bladder. This is usually a problem that has come on gradually.  °HOME CARE INSTRUCTIONS °If you are sent home with a Foley catheter and a drainage system, you will need to discuss the best course of action with your health care provider. While the catheter is in, maintain a good intake of fluids. Keep the drainage bag emptied and lower than your catheter. This is so that contaminated urine will not flow back into your bladder, which could lead to a urinary tract infection. °There are two main types of drainage bags. One is a large bag that usually is used at night. It has a good capacity that will allow you to sleep through the night without having to empty it. The second type is called a leg bag. It has a smaller capacity, so it needs to be emptied more frequently. However, the main advantage is that it can be attached by a leg strap and can go underneath your clothing, allowing you the freedom to move about or leave your home. °Only take over-the-counter or prescription medicines for pain, discomfort, or fever as directed by your health care provider.  °SEEK MEDICAL CARE IF: °· You develop a low-grade fever. °· You experience spasms or leakage of urine with the spasms. °SEEK IMMEDIATE MEDICAL CARE IF:  °· You develop chills or fever. °· Your catheter stops draining urine. °· Your catheter falls out. °· You start to develop increased bleeding that does not respond to rest and increased fluid intake. °MAKE SURE YOU: °· Understand these instructions. °· Will watch your condition. °· Will get help right away if you are not doing well or get worse. °Document Released: 07/24/2000 Document Revised: 04/22/2013 Document Reviewed: 09/26/2012 °ExitCare® Patient Information ©2015 [employer]. This information is not  intended to replace advice given to you by your health care provider. Make sure you discuss any questions you have with your health care provider. ° °

## 2014-08-08 NOTE — ED Notes (Signed)
D/c instructions reviewed w/ pt and family - pt and family deny any further questions or concerns at present. Rx given x1  

## 2014-08-08 NOTE — Patient Instructions (Signed)
1.  I am unable to place a catheter in you today; please present to Emergency Department for catheter placement.

## 2014-08-08 NOTE — ED Notes (Signed)
Pt reports being unable to void x2 days - pt has hx of MS - states he is unsure of the cause of his urinary retention, concerned maybe he did not drink enough fluids while taking his diuretic. Pt normally w/ urinary continence, admits to lower abd pain now which pt attributes to his bladder distention. Pt also admits to poor PO intake x2 days. Denies any recent fever or illness.

## 2014-08-08 NOTE — Progress Notes (Signed)
Subjective:    Patient ID: Randy Shaw, male    DOB: 09-10-1960, 54 y.o.   MRN: [id number]  08/08/2014  patient states he is unable to urinate   HPI This 54 y.o. male presents for evaluation of urinary hesitancy and retention.  Pt evaluated on 06/30/2014 for urinary retention; catheter placed in ED; urine culture negative in ED; follow-up with Dr. Merla Riches on 3/2 and 3/4 for follow-up; treated for constipation.  Has urologist/[facility] with last visit 05/2014.  Maintained on Flomax and Oxybutynin by urology.   Recurrent symptoms two weeks ago.  Usually happens when eats something different and develops constipation.  Unable to get urinate out; last urination was two days ago.  Having discomfort.  Intermittent issue for last couple years.  Dr. Annabell Howells is urologist; did not discuss this issue with Dr. Annabell Howells at recent visit in January 2016.  Has undergone catheter placement at [facility] in the past.  No dysuria, urgency, frequency, worsening nocturia.  No fever/chills/sweats.  No nausea or vomiting.  Last bowel movement was two days ago; usually has a daily b.m..  Taking Miralax daily at this time.  No urinary leakage.  Denies penile discharge, testicular swelling or pain, penile pain.  +abdominal discomfort.  Has MS and suffers with weakness and difficulty with ambulation.  Lives alone.   Review of Systems  Constitutional: Negative for fever, chills, diaphoresis and fatigue.  Gastrointestinal: Positive for abdominal pain, constipation and abdominal distention. Negative for nausea, vomiting and diarrhea.  Genitourinary: Positive for urgency, decreased urine volume and difficulty urinating. Negative for dysuria, frequency, hematuria, flank pain, discharge, penile swelling, scrotal swelling, genital sores, penile pain and testicular pain.    Past Medical History  Diagnosis Date  . Multiple sclerosis   . Hypertension   . BPH (benign prostatic hyperplasia)   . Asthma   . Abnormality of gait 04/22/2013    Past Surgical History  Procedure Laterality Date  . None     Allergies  Allergen Reactions  . Peanut-Containing Drug Products Shortness Of Breath  . Zanaflex [Tizanidine Hcl] Shortness Of Breath   Current Outpatient Prescriptions  Medication Sig Dispense Refill  . baclofen (LIORESAL) 20 MG tablet TAKE ONE TABLET BY MOUTH THREE TIMES DAILY 90 tablet 5  . hydrochlorothiazide (MICROZIDE) 12.5 MG capsule TAKE ONE CAPSULE BY MOUTH EVERY DAY 30 capsule 12  . oxybutynin (DITROPAN) 5 MG tablet Take 1 tablet (5 mg total) by mouth 2 (two) times daily. 90 tablet 12  . polyethylene glycol powder (GLYCOLAX/MIRALAX) powder Take 17 g by mouth 2 (two) times daily as needed. 225 g 1  . tamsulosin (FLOMAX) 0.4 MG CAPS capsule Take 1 capsule (0.4 mg total) by mouth daily. 30 capsule 0  . TECFIDERA 240 MG CPDR Take 1 capsule ( ) by  mouth twice a day 180 capsule 1   No current facility-administered medications for this visit.       Objective:    BP 144/94 mmHg  Pulse 95  Temp(Src) 98.5 F (36.9 C) (Oral)  Resp 17  SpO2 98% Physical Exam  Constitutional: He appears well-developed and well-nourished. No distress.  Poor hygiene.  Cardiovascular: Normal rate and regular rhythm.   No murmur heard. Pulmonary/Chest: Effort normal and breath sounds normal. No respiratory distress. He has no wheezes. He has no rales.  Abdominal: Soft. Bowel sounds are normal. He exhibits distension. There is generalized tenderness. There is no rebound, no guarding and no CVA tenderness.  Genitourinary: Testes normal and penis normal. Right testis  shows no mass, no swelling and no tenderness. Left testis shows no mass, no swelling and no tenderness.  Skin: He is not diaphoretic.   Results for orders placed or performed during the hospital encounter of 06/30/14  Urine culture  Result Value Ref Range   Specimen Description URINE, CATHETERIZED    Special Requests NONE    Colony Count NO GROWTH Performed at  [facility]     Culture NO GROWTH Performed at [facility]     Report Status 07/02/2014 FINAL   Urinalysis, Routine w reflex microscopic  Result Value Ref Range   Color, Urine YELLOW YELLOW   APPearance CLEAR CLEAR   Specific Gravity, Urine 1.012 1.005 - 1.030   pH 6.5 5.0 - 8.0   Glucose, UA NEGATIVE NEGATIVE mg/dL   Hgb urine dipstick TRACE (A) NEGATIVE   Bilirubin Urine NEGATIVE NEGATIVE   Ketones, ur NEGATIVE NEGATIVE mg/dL   Protein, ur NEGATIVE NEGATIVE mg/dL   Urobilinogen, UA 1.0 0.0 - 1.0 mg/dL   Nitrite NEGATIVE NEGATIVE   Leukocytes, UA SMALL (A) NEGATIVE  Urine microscopic-add on  Result Value Ref Range   Squamous Epithelial / LPF RARE RARE   WBC, UA 7-10 <3 WBC/hpf   RBC / HPF 0-2 <3 RBC/hpf   Bacteria, UA RARE RARE       Assessment & Plan:   1. Urinary retention   2. BPH (benign prostatic hypertrophy) with urinary obstruction   3. Multiple sclerosis   4. Slow transit constipation     -Recurrent issue. -Currently no staff member at facility that is trained in foley catheter placement; thus pt instructed to present to ED for foley catheter placement. -Needs follow-up and evaluation by urology regarding recurrent nature of retention.  Will place referral.   No orders of the defined types were placed in this encounter.    No Follow-up on file.     Virlee Stroschein Paulita Fujita, M.D. [facility] [address] [place], [place]  [address] [phone] phone [phone] fax

## 2014-08-08 NOTE — ED Notes (Signed)
MD at bedside. 

## 2014-08-09 LAB — URINE CULTURE
CULTURE: NO GROWTH
Colony Count: NO GROWTH
SPECIAL REQUESTS: NORMAL

## 2014-08-12 ENCOUNTER — Ambulatory Visit (INDEPENDENT_AMBULATORY_CARE_PROVIDER_SITE_OTHER): Payer: Self-pay | Admitting: Neurology

## 2014-08-12 VITALS — BP 138/86 | HR 96 | Temp 98.3°F | Resp 16

## 2014-08-12 DIAGNOSIS — R413 Other amnesia: Secondary | ICD-10-CM

## 2014-08-12 DIAGNOSIS — G35 Multiple sclerosis: Secondary | ICD-10-CM

## 2014-08-12 DIAGNOSIS — R269 Unspecified abnormalities of gait and mobility: Secondary | ICD-10-CM

## 2014-08-12 NOTE — Progress Notes (Signed)
Reviewed, agree above. EDSS 7.0 today.

## 2014-08-12 NOTE — Progress Notes (Signed)
Randy Shaw (DOB: [id number]) was seen today for Visit 1 (Week 1) for the [employer] CLR_09_21 Research Trial (A Placebo-Controlled Randomized Withdrawal Evaluation of the Efficacy and Safety of Baclofen ER Capsules (GRS) In Subjects with Spasticity due to Multiple Sclerosis).  Since his last research visit on 07APR2016 (Screening Visit), patient stated that he had visited the ER on [id number] due to worsening of urinary retention. To treat this, Ciprofloxacin 500 mg b.i.d. Was prescribed for 7 days and an foley catheter was placed. Vital signs were non-clinically-significant. Expanded Disability Status Scale (EDSS) was performed by Dr. Krista Blue (EDSS score: 7.0). C-SSRS was performed by [place] with no significant findings. Patient was dispensed Baclofen IR 20 mg t.i.d. (kit numbers: [id number] and [id number]). Subject Diary was dispensed. Diary and dosing instructions were reviewed with the patient.   Patient was reminded to stay on stable caffeine and nicotine consumption and minimize alcohol consumption if applicable. Ashworth Assessment was performed by Ashworth Assessor Rizwan Sabir. Finally, patient's Visit 2 (Week 2) was scheduled for Monday, 18APR2016.

## 2014-08-14 ENCOUNTER — Telehealth: Payer: Self-pay | Admitting: Neurology

## 2014-08-14 NOTE — Progress Notes (Signed)
I have reviewed and agreed above plan. 

## 2014-08-14 NOTE — Telephone Encounter (Signed)
Update medication list

## 2014-08-19 ENCOUNTER — Other Ambulatory Visit: Payer: Self-pay | Admitting: Emergency Medicine

## 2014-08-19 ENCOUNTER — Telehealth: Payer: Self-pay | Admitting: Neurology

## 2014-08-19 NOTE — Telephone Encounter (Signed)
I spoke to the patient to remind him about his upcoming appointment on Thursday, 21APR2016 at 10:30h. I also reminded him to bring all of the bottles of study medication as well as the study diary.

## 2014-08-20 ENCOUNTER — Ambulatory Visit (INDEPENDENT_AMBULATORY_CARE_PROVIDER_SITE_OTHER): Payer: Self-pay | Admitting: Neurology

## 2014-08-20 VITALS — BP 148/76 | HR 90 | Temp 98.1°F | Resp 12

## 2014-08-20 DIAGNOSIS — R269 Unspecified abnormalities of gait and mobility: Secondary | ICD-10-CM

## 2014-08-20 DIAGNOSIS — G35 Multiple sclerosis: Secondary | ICD-10-CM

## 2014-08-20 DIAGNOSIS — Z0289 Encounter for other administrative examinations: Secondary | ICD-10-CM

## 2014-08-20 NOTE — Progress Notes (Signed)
Randy Shaw. Strength (DOB: [id number]) was seen today for Visit 2 (Week 2) for the [employer] CLR_09_21 Research Trial (A Placebo-Controlled Randomized Withdrawal Evaluation of the Efficacy and Safety of Baclofen ER Capsules (GRS) In Subjects with Spasticity due to Multiple Sclerosis).  Since his last research visit on [id number] (Visit 1 - Week 1), patient stated that there were not new adverse events or changes in his medications. Vital signs were non-clinically-significant. C-SSRS was performed by [place] with no significant findings. Patient returned bottles of Baclofen IR and was found to be compliant. Patient was dispensed Baclofen ER (GRS) (kit number: [id number]). Diary pages were collected. Diary and dosing instructions were reviewed with the patient.   A 12 lead ECG was also performed in triplicate at two minute intervals: 09:45h, 09:47h, and 09:49h.  Patient was reminded to stay on stable caffeine and nicotine consumption and minimize alcohol consumption if applicable. Ashworth Assessment was performed by Ashworth Assessor Rizwan Sabir. Finally, patient's Visit 3 (Week 4) was scheduled for Tuesday, [id number] at 10:30h.

## 2014-08-21 NOTE — Progress Notes (Signed)
I have reviewed and agreed above plan. 

## 2014-08-24 ENCOUNTER — Telehealth: Payer: Self-pay | Admitting: Neurology

## 2014-08-24 NOTE — Telephone Encounter (Signed)
I left a message for the patient to return my call.

## 2014-08-24 NOTE — Telephone Encounter (Signed)
I spoke to the patient for a follow-up phone call today 25APR2016 at 16:05h. Patient expressed that the control of spasticity was unacceptable, because it has been more challenging for him to stand up and reach for objects. Still, patient regarded the side effects of the study medicine as tolerable and even mentioned that he no longer feels "sleepy" like he used to do when he was taking Baclofen IR. Patient experienced a fall on Sunday 24APR2016 at 23:00h. There was not bruising or other symptoms related. This was reported as an Adverse Events. Finally, patient expressed interest in increasing the dose during the next appointment on 03MAY2016.

## 2014-08-25 ENCOUNTER — Telehealth: Payer: Self-pay | Admitting: Neurology

## 2014-08-25 NOTE — Telephone Encounter (Signed)
I left a message for the patient to return my call.

## 2014-08-25 NOTE — Telephone Encounter (Addendum)
Reviewed, will bring him in earlier for baclofen dosage increase.

## 2014-08-25 NOTE — Telephone Encounter (Signed)
I spoke to the patient to schedule an unscheduled visit to increase dosage. Unscheduled Visit was scheduled for 27APR2016 at 08:30h.

## 2014-08-26 ENCOUNTER — Ambulatory Visit (INDEPENDENT_AMBULATORY_CARE_PROVIDER_SITE_OTHER): Payer: Self-pay | Admitting: Neurology

## 2014-08-26 VITALS — BP 154/92 | HR 96 | Temp 98.3°F | Resp 12

## 2014-08-26 DIAGNOSIS — R269 Unspecified abnormalities of gait and mobility: Secondary | ICD-10-CM

## 2014-08-26 DIAGNOSIS — R413 Other amnesia: Secondary | ICD-10-CM

## 2014-08-26 DIAGNOSIS — G35 Multiple sclerosis: Secondary | ICD-10-CM

## 2014-08-26 NOTE — Progress Notes (Signed)
Reviewed, agree above 

## 2014-08-26 NOTE — Progress Notes (Signed)
Randy Shaw (DOB: [id number]) was seen today for an Unscheduled Visit - Dose Adjustment for the [employer] CLR_09_21 Research Trial (A Placebo-Controlled Randomized Withdrawal Evaluation of the Efficacy and Safety of Baclofen ER Capsules (GRS) In Subjects with Spasticity due to Multiple Sclerosis).  Since his last research visit on 21APR2016 Visit 2 (Week 2), patient stated that there were not new adverse events or changes in his medications. Vital signs were non-clinically-significant. C-SSRS was performed by Research Coordinator Marua Qin Munoz Pineda with no significant findings. Patient returned study medicine (kit number: [id number]) and was found to be compliant. Dr. Yan spoke to the patient, and per her note: "Patient reported mildly increased left leg spasticity, jerking movement." Therefore, a dose adjustment from 60 mg to 70 mg was performed. Patient was dispensed 70 mg of Baclofen ER (GRS) (kit number: [id number]). Diary pages were collected. Diary and dosing instructions were reviewed with the patient. Clinical Laboratory tests were performed.   A 12 lead ECG was also performed in triplicate at two minute intervals: 08:55h, 08:57h, and 08:59h.  Patient was reminded to stay on stable caffeine and nicotine consumption and minimize alcohol consumption if applicable. Ashworth Assessment was performed by independent Ashworth Assessors.    patient's Visit 3 (Week 4) was scheduled for Tuesday, 03MAY2016 at 10:30h.     

## 2014-08-31 ENCOUNTER — Telehealth: Payer: Self-pay | Admitting: Neurology

## 2014-08-31 NOTE — Telephone Encounter (Signed)
I spoke to the patient to confirm his appointment for 03MAY2016 at 10:30 for the [facility] study. I also reminded to bring his study medication along with the diary pages.

## 2014-09-01 ENCOUNTER — Encounter (INDEPENDENT_AMBULATORY_CARE_PROVIDER_SITE_OTHER): Payer: Self-pay

## 2014-09-01 DIAGNOSIS — Z0289 Encounter for other administrative examinations: Secondary | ICD-10-CM

## 2014-09-17 ENCOUNTER — Other Ambulatory Visit: Payer: Self-pay | Admitting: Internal Medicine

## 2014-09-17 ENCOUNTER — Encounter (INDEPENDENT_AMBULATORY_CARE_PROVIDER_SITE_OTHER): Payer: Self-pay | Admitting: Neurology

## 2014-09-17 DIAGNOSIS — Z0289 Encounter for other administrative examinations: Secondary | ICD-10-CM

## 2014-10-12 ENCOUNTER — Encounter (INDEPENDENT_AMBULATORY_CARE_PROVIDER_SITE_OTHER): Payer: Self-pay

## 2014-10-12 ENCOUNTER — Telehealth: Payer: Self-pay | Admitting: *Deleted

## 2014-10-12 DIAGNOSIS — Z0289 Encounter for other administrative examinations: Secondary | ICD-10-CM

## 2014-10-12 NOTE — Telephone Encounter (Signed)
Form, [employer] sent to [employer] and Dr Anne Hahn 10/12/14.

## 2014-10-14 ENCOUNTER — Telehealth: Payer: Self-pay

## 2014-10-14 ENCOUNTER — Other Ambulatory Visit: Payer: Self-pay

## 2014-10-14 ENCOUNTER — Encounter (INDEPENDENT_AMBULATORY_CARE_PROVIDER_SITE_OTHER): Payer: Self-pay

## 2014-10-14 DIAGNOSIS — Z0289 Encounter for other administrative examinations: Secondary | ICD-10-CM

## 2014-10-14 MED ORDER — DIMETHYL FUMARATE 240 MG PO CPDR: DELAYED_RELEASE_CAPSULE | ORAL | Status: DC

## 2014-10-14 NOTE — Telephone Encounter (Signed)
I called the patient. I had a few questions about the form the patient brought by to have filled out. He helped me answer the questions I could not answer from the office visit notes.

## 2014-10-14 NOTE — Telephone Encounter (Signed)
I called the patient and left a voicemail asking him to call me back.  

## 2014-10-14 NOTE — Telephone Encounter (Signed)
Patient called returning Kelby's phone call. °

## 2014-10-15 NOTE — Telephone Encounter (Signed)
Form,[place] [employer] received,completed by Dr Anne Hahn and Earney Navy 10/15/14.

## 2014-10-20 ENCOUNTER — Ambulatory Visit (INDEPENDENT_AMBULATORY_CARE_PROVIDER_SITE_OTHER): Payer: 59 | Admitting: Urgent Care

## 2014-10-20 VITALS — BP 122/82 | HR 86 | Temp 98.5°F | Resp 20 | Ht 75.0 in | Wt 195.5 lb

## 2014-10-20 DIAGNOSIS — I10 Essential (primary) hypertension: Secondary | ICD-10-CM | POA: Diagnosis not present

## 2014-10-20 MED ORDER — HYDROCHLOROTHIAZIDE 12.5 MG PO CAPS: 12.5000 mg | ORAL_CAPSULE | Freq: Every day | ORAL | Status: DC

## 2014-10-20 NOTE — Patient Instructions (Signed)

## 2014-10-20 NOTE — Progress Notes (Signed)
    MRN: [id number] DOB: [birth year]  Subjective:   Randy Shaw is a 54 y.o. male presenting for chief complaint of Hypertension  BP - managed with HCT. Reports that he does well with medication, denies dizziness or low blood pressure. Does not check blood pressure at home but has MS and gets regular follow up, reports that his blood pressure has been "up but is doing great today". Denies chest pain, chest tightness, neck pain, jaw pain, arm pain, diaphoresis, palpitations. Exercise is limited due to MS and disability. Denies any other aggravating or relieving factors, no other questions or concerns.  Randy Shaw has a current medication list which includes the following prescription(s): baclofen, dimethyl fumarate, hydrochlorothiazide, oxybutynin, polyethylene glycol powder, and tamsulosin. He is allergic to peanut-containing drug products and zanaflex.  Randy Shaw  has a past medical history of Multiple sclerosis; Hypertension; BPH (benign prostatic hyperplasia); Asthma; and Abnormality of gait (04/22/2013). Also  has past surgical history that includes none.  ROS As in subjective.  Objective:   Vitals: BP 122/82 mmHg  Pulse 86  Temp(Src) 98.5 F (36.9 C) (Oral)  Resp 20  Ht  (1.905 m)  Wt 195 lb 8 oz (88.678 kg)  BMI 24.44 kg/m2  SpO2 97%  BP Readings from Last 3 Encounters:  10/20/14 122/82  08/26/14 154/92  08/20/14 148/76   Physical Exam  Constitutional: He is oriented to person, place, and time. He appears well-developed and well-nourished.  HENT:  Mouth/Throat: Oropharynx is clear and moist.  Eyes: Conjunctivae and EOM are normal. Pupils are equal, round, and reactive to light. Right eye exhibits no discharge. Left eye exhibits no discharge. No scleral icterus.  Neck: Normal range of motion. Neck supple. No thyromegaly present.  Cardiovascular: Normal rate, regular rhythm and intact distal pulses.  Exam reveals no gallop and no friction rub.   No murmur  heard. Pulmonary/Chest: No respiratory distress. He has no wheezes. He has no rales.  Neurological: He is alert and oriented to person, place, and time.  Skin: Skin is warm and dry. No rash noted. No erythema. No pallor.   Assessment and Plan :   1. Essential hypertension - Stable today, in April patient was not well controlled. For now, provided refill for same dose. Discussed ways to better control his BP. Patient verbalized understanding. - Recheck in 3 months.  Wallis Bamberg, PA-C [facility] [phone] 10/20/2014 1:13 PM   UPDATE: I did not place orders for lab draw while patient was here. I will request that patient come back for labs only over the next month.

## 2014-11-12 ENCOUNTER — Encounter (INDEPENDENT_AMBULATORY_CARE_PROVIDER_SITE_OTHER): Payer: Self-pay

## 2014-11-12 DIAGNOSIS — Z0289 Encounter for other administrative examinations: Secondary | ICD-10-CM

## 2014-12-08 ENCOUNTER — Encounter (INDEPENDENT_AMBULATORY_CARE_PROVIDER_SITE_OTHER): Payer: Self-pay | Admitting: Neurology

## 2014-12-08 DIAGNOSIS — Z0289 Encounter for other administrative examinations: Secondary | ICD-10-CM

## 2014-12-11 ENCOUNTER — Telehealth: Payer: Self-pay

## 2014-12-11 NOTE — Telephone Encounter (Signed)
I spoke to the patient about his urinalysis results, and the patient does not exhibit burning sensation, pain, or any fever when micturating. I told the patient that I will relay this information to Dr. Terrace Arabia, and that I will let him know if further examination will be needed.

## 2014-12-11 NOTE — Telephone Encounter (Signed)
Reviewed, advise patient well hydration,will not start treatment at this point.

## 2014-12-14 NOTE — Telephone Encounter (Signed)
I spoke to the patient and relayed Dr. Zannie Cove message about staying well hydrated. Patient voiced understanding and did not have any questions.

## 2014-12-16 ENCOUNTER — Encounter (INDEPENDENT_AMBULATORY_CARE_PROVIDER_SITE_OTHER): Payer: Self-pay | Admitting: Neurology

## 2014-12-16 DIAGNOSIS — Z0289 Encounter for other administrative examinations: Secondary | ICD-10-CM

## 2014-12-23 ENCOUNTER — Telehealth: Payer: Self-pay | Admitting: Neurology

## 2014-12-23 NOTE — Telephone Encounter (Signed)
I called Randy Shaw to remind him of his research appointment which is scheduled for tomorrow (12/24/2014) at 9am.  I also reminded him to bring his study diary along with his medication.

## 2014-12-24 ENCOUNTER — Encounter (INDEPENDENT_AMBULATORY_CARE_PROVIDER_SITE_OTHER): Payer: Self-pay | Admitting: Neurology

## 2014-12-24 DIAGNOSIS — Z0289 Encounter for other administrative examinations: Secondary | ICD-10-CM

## 2014-12-31 ENCOUNTER — Ambulatory Visit (INDEPENDENT_AMBULATORY_CARE_PROVIDER_SITE_OTHER): Payer: Self-pay | Admitting: Neurology

## 2014-12-31 DIAGNOSIS — G35 Multiple sclerosis: Secondary | ICD-10-CM

## 2014-12-31 NOTE — Progress Notes (Signed)
[facility] 9-21 visit 9, completed the procedure according to protocol.

## 2015-01-06 ENCOUNTER — Encounter (INDEPENDENT_AMBULATORY_CARE_PROVIDER_SITE_OTHER): Payer: Self-pay | Admitting: Neurology

## 2015-01-06 DIAGNOSIS — Z0289 Encounter for other administrative examinations: Secondary | ICD-10-CM

## 2015-02-01 ENCOUNTER — Telehealth: Payer: Self-pay

## 2015-02-01 ENCOUNTER — Encounter (INDEPENDENT_AMBULATORY_CARE_PROVIDER_SITE_OTHER): Payer: Self-pay

## 2015-02-01 DIAGNOSIS — Z0289 Encounter for other administrative examinations: Secondary | ICD-10-CM

## 2015-02-01 NOTE — Telephone Encounter (Signed)
I spoke to the patient to confirm his appointment research appointment on 03OCT2016 at 09:30h. Patient confirmed the appointment.

## 2015-02-22 ENCOUNTER — Telehealth: Payer: Self-pay

## 2015-02-22 NOTE — Telephone Encounter (Signed)
I left a message for the patient to return my call.

## 2015-03-29 ENCOUNTER — Encounter: Payer: Self-pay | Admitting: Internal Medicine

## 2015-03-29 ENCOUNTER — Telehealth: Payer: Self-pay | Admitting: Neurology

## 2015-03-29 NOTE — Telephone Encounter (Signed)
I spoke with Randy Shaw.  I reminded him of his scheduled [name] 11_04 research appointment for 03/30/2015 at 9am.  I reminded him to bring all of his study medication as well as any applicable diary entries.  He said that he would and look forward to seeing [place].

## 2015-03-30 ENCOUNTER — Encounter (INDEPENDENT_AMBULATORY_CARE_PROVIDER_SITE_OTHER): Payer: Self-pay

## 2015-03-30 DIAGNOSIS — Z0289 Encounter for other administrative examinations: Secondary | ICD-10-CM

## 2015-04-21 ENCOUNTER — Telehealth: Payer: Self-pay

## 2015-04-21 NOTE — Telephone Encounter (Signed)
I left a message for the patient to return my call.

## 2015-05-12 ENCOUNTER — Telehealth: Payer: Self-pay | Admitting: Neurology

## 2015-05-12 NOTE — Telephone Encounter (Signed)
Pt has called back regarding PA for tecfidera. He said he updated Insurance with Angie.

## 2015-05-12 NOTE — Telephone Encounter (Signed)
I called back and spoke with patient.  Obtained info needed to initiate this process BIN: [id number]  Group: [id number]  JX:[id number] Pharmacy Help Desk [phone].  Advised we will contact ins regarding PA.    Ins has been contacted and provided with clinical info.  Request is under review Ref # [id number]  [employer] [place] has approved the request for coverage on Tecfidera effective until 04/30/2038, or until the policy changes or is terminated.  Called patient to advise, got no answer.  Left message.

## 2015-05-12 NOTE — Telephone Encounter (Signed)
Patient is calling regarding prior authorization for medication Dimethyl Fumarate (TECFIDERA) 240 MG CPDR. The patient has changed insurance companies. Angie in billing will change the insurance in the  patient's chart. The patient states he now has [employer]. Thank you.

## 2015-05-31 ENCOUNTER — Other Ambulatory Visit: Payer: Self-pay

## 2015-05-31 MED ORDER — DIMETHYL FUMARATE 240 MG PO CPDR: DELAYED_RELEASE_CAPSULE | ORAL | Status: DC

## 2015-07-06 ENCOUNTER — Encounter (INDEPENDENT_AMBULATORY_CARE_PROVIDER_SITE_OTHER): Payer: Self-pay | Admitting: Neurology

## 2015-07-06 ENCOUNTER — Telehealth: Payer: Self-pay

## 2015-07-06 DIAGNOSIS — Z0289 Encounter for other administrative examinations: Secondary | ICD-10-CM

## 2015-07-06 NOTE — Telephone Encounter (Signed)
I left a message for the patient to return my call.

## 2015-10-04 ENCOUNTER — Other Ambulatory Visit: Payer: Self-pay | Admitting: Neurology

## 2015-10-04 MED ORDER — BACLOFEN 20 MG PO TABS: ORAL_TABLET | ORAL | Status: DC

## 2015-10-04 NOTE — Addendum Note (Signed)
Addended by: Levert Feinstein on: 10/04/2015 03:16 PM   Modules accepted: Orders

## 2015-10-04 NOTE — Progress Notes (Deleted)
Subjective:    Patient ID: Randy Shaw is a 55 y.o. male.  HPI {Common ambulatory SmartLinks:[id number]}  Review of Systems  Objective:  Neurologic Exam  Physical Exam  Assessment:   ***  Plan:   ***

## 2015-10-08 ENCOUNTER — Telehealth: Payer: Self-pay

## 2015-10-08 NOTE — Telephone Encounter (Signed)
FYI

## 2015-10-08 NOTE — Telephone Encounter (Signed)
[place]  FYI   Alethia Berthold with [employer] Nurse Case Manager is working with patient to offer support and education.    [phone]  Ext [phone]

## 2015-12-22 ENCOUNTER — Other Ambulatory Visit: Payer: Self-pay | Admitting: *Deleted

## 2015-12-22 MED ORDER — DIMETHYL FUMARATE 240 MG PO CPDR: DELAYED_RELEASE_CAPSULE | ORAL | 5 refills | Status: DC

## 2015-12-27 ENCOUNTER — Ambulatory Visit (INDEPENDENT_AMBULATORY_CARE_PROVIDER_SITE_OTHER): Payer: BLUE CROSS/BLUE SHIELD | Admitting: Physician Assistant

## 2015-12-27 ENCOUNTER — Ambulatory Visit (INDEPENDENT_AMBULATORY_CARE_PROVIDER_SITE_OTHER): Payer: BLUE CROSS/BLUE SHIELD

## 2015-12-27 ENCOUNTER — Telehealth: Payer: Self-pay

## 2015-12-27 VITALS — BP 124/76 | HR 86 | Temp 98.2°F | Resp 18

## 2015-12-27 DIAGNOSIS — Z23 Encounter for immunization: Secondary | ICD-10-CM | POA: Diagnosis not present

## 2015-12-27 DIAGNOSIS — S99922A Unspecified injury of left foot, initial encounter: Secondary | ICD-10-CM

## 2015-12-27 DIAGNOSIS — M79672 Pain in left foot: Secondary | ICD-10-CM | POA: Diagnosis not present

## 2015-12-27 DIAGNOSIS — I10 Essential (primary) hypertension: Secondary | ICD-10-CM | POA: Diagnosis not present

## 2015-12-27 MED ORDER — HYDROCHLOROTHIAZIDE 12.5 MG PO CAPS: 12.5000 mg | ORAL_CAPSULE | Freq: Every day | ORAL | 5 refills | Status: DC

## 2015-12-27 MED ORDER — HYDROCHLOROTHIAZIDE 12.5 MG PO CAPS: 12.5000 mg | ORAL_CAPSULE | Freq: Every day | ORAL | 0 refills | Status: DC

## 2015-12-27 NOTE — Telephone Encounter (Signed)
Randy Shaw took message from Randy Shaw asking for RF of HCTZ 0.25. They are having trouble getting it to go electronically. Pt was supposed to RTC 3 mos after last OV 09/2014, but had a year's RFs on med. I will give 1 mos to give him time to RTC.

## 2015-12-27 NOTE — Progress Notes (Signed)
Patient ID: Randy Shaw, male    DOB: 10/03/1960, 55 y.o.   MRN: [id number]  PCP: Tonye Pearson, MD  Subjective:   Chief Complaint  Patient presents with  . Foot Injury    Happened on Thursday. Dropped partition on foot.     HPI Presents for evaluation of LEFT foot pain since 12/23/2015.  He was re-hanging the folding door to the laundry room that had come off the tracks when the phone rang and the doorbell rang at the same time. In the commotion, the door slipped from his hand and fell on the LEFT foot. Pain is minimal at rest, but significant with weight bearing. He thought that the pain would improve, but it hasn't and his grandmother urged him to seek evaluation.  He has MS and uses a motorized wheelchair to get around most of the time. He has a cane and a walker at home, and is able to stand.    Review of Systems Foot pain as above.    Patient Active Problem List   Diagnosis Date Noted  . Abnormality of gait 04/22/2013  . HTN (hypertension) 06/29/2012  . Multiple sclerosis (HCC) 06/29/2012  . BPH (benign prostatic hypertrophy) with urinary obstruction 06/29/2012     Prior to Admission medications   Medication Sig Start Date End Date Taking? Authorizing Provider  baclofen (LIORESAL) 20 MG tablet One tab in the morning, one tab at noon, 2 tabs at night 10/04/15  Yes Levert Feinstein, MD  Dimethyl Fumarate (TECFIDERA) 240 MG CPDR Take 1 capsule (240mg ) by  mouth twice a day 12/22/15  Yes Levert Feinstein, MD  hydrochlorothiazide (MICROZIDE) 12.5 MG capsule Take 1 capsule (12.5 mg total) by mouth daily. 12/27/15  Yes Wallis Bamberg, PA-C  tamsulosin (FLOMAX) 0.4 MG CAPS capsule Take 1 capsule (0.4 mg total) by mouth daily. 07/01/14  Yes Tonye Pearson, MD  UNABLE TO FIND Mybetriq 50 mg   Yes Historical Provider, MD  oxybutynin (DITROPAN) 5 MG tablet Take 1 tablet (5 mg total) by mouth 2 (two) times daily. Patient not taking: Reported on 12/27/2015 08/06/14   Levert Feinstein, MD    polyethylene glycol powder (GLYCOLAX/MIRALAX) powder Take 17 g by mouth 2 (two) times daily as needed. Patient not taking: Reported on 12/27/2015 07/01/14   Tonye Pearson, MD     Allergies  Allergen Reactions  . Peanut-Containing Drug Products Shortness Of Breath  . Zanaflex [Tizanidine Hcl] Shortness Of Breath       Objective:  Physical Exam  Constitutional: He is oriented to person, place, and time. He appears well-developed and well-nourished. He is active and cooperative. No distress.  BP 124/76   Pulse 86   Temp 98.2 F (36.8 C) (Oral)   Resp 18   SpO2 98%    Eyes: Conjunctivae are normal.  Pulmonary/Chest: Effort normal.  Musculoskeletal:       Right ankle: He exhibits swelling (non-pitting, 1+). No tenderness.       Left ankle: He exhibits swelling (non-pitting 1+). No tenderness.       Right foot: There is swelling. There is normal range of motion, no tenderness, no bony tenderness, normal capillary refill, no crepitus, no deformity and no laceration.       Left foot: There is decreased range of motion, tenderness and swelling. There is normal capillary refill, no crepitus, no deformity and no laceration.       Feet:  Neurological: He is alert and oriented to person, place, and  time.  Skin: Skin is warm and dry.  Psychiatric: He has a normal mood and affect. His speech is normal and behavior is normal.      Dg Foot Complete Left  Result Date: 12/27/2015 CLINICAL DATA:  Door fell on patient's left foot, initial encounter. EXAM: LEFT FOOT - COMPLETE 3+ VIEW COMPARISON:  None. FINDINGS: Mild first metatarsophalangeal joint osteoarthritis. Tiny osseous density adjacent to the lateral base of the third proximal phalanx appears well corticated. No evidence of an acute fracture. IMPRESSION: 1. No acute osseous abnormality. 2. Mild first metatarsophalangeal joint osteoarthritis. Electronically Signed   By: Leanna BattlesMelinda  Blietz M.D.   On: 12/27/2015 15:22        Assessment  & Plan:   1. Foot pain, left 2. Foot injury, left, initial encounter No fracture noted on radiographs. Supportive care. Recommend against weight bearing LEFT foot until pain resolves. He has assistive devices at home. Ice. Acetaminophen as needed. - DG Foot Complete Left; Future  3. Need for influenza vaccination - Flu Vaccine QUAD 36+ mos IM  4. Essential hypertension Controlled. Stable. Continue current treatment. - hydrochlorothiazide (MICROZIDE) 12.5 MG capsule; Take 1 capsule (12.5 mg total) by mouth daily.  Dispense: 30 capsule; Refill: 5   He will need to establish with new PCP as Dr. Merla Richesoolittle has retired.    Fernande Brashelle S. Holli Rengel, PA-C Physician Assistant-Certified [facility]

## 2015-12-27 NOTE — Telephone Encounter (Signed)
Notified pharm that I sent RF but pt needs to RTC for more.

## 2015-12-27 NOTE — Patient Instructions (Signed)
     IF you received an x-ray today, you will receive an invoice from [facility]. Please contact [facility] at [phone] with questions or concerns regarding your invoice.   IF you received labwork today, you will receive an invoice from [facility]. Please contact [facility] at [phone] with questions or concerns regarding your invoice.   Our billing staff will not be able to assist you with questions regarding bills from these companies.  You will be contacted with the lab results as soon as they are available. The fastest way to get your results is to activate your My Chart account. Instructions are located on the last page of this paperwork. If you have not heard from us regarding the results in 2 weeks, please contact this office.      

## 2015-12-29 ENCOUNTER — Encounter: Payer: Self-pay | Admitting: Physician Assistant

## 2016-01-20 ENCOUNTER — Other Ambulatory Visit: Payer: Self-pay

## 2016-01-20 MED ORDER — DIMETHYL FUMARATE 240 MG PO CPDR: DELAYED_RELEASE_CAPSULE | ORAL | 5 refills | Status: DC

## 2016-01-20 NOTE — Telephone Encounter (Signed)
Refills e-scribed to [facility] per faxed request.

## 2016-01-23 ENCOUNTER — Other Ambulatory Visit: Payer: Self-pay | Admitting: Urgent Care

## 2016-01-23 DIAGNOSIS — I10 Essential (primary) hypertension: Secondary | ICD-10-CM

## 2016-02-10 DIAGNOSIS — N3281 Overactive bladder: Secondary | ICD-10-CM | POA: Diagnosis not present

## 2016-02-10 DIAGNOSIS — N401 Enlarged prostate with lower urinary tract symptoms: Secondary | ICD-10-CM | POA: Diagnosis not present

## 2016-02-10 DIAGNOSIS — R972 Elevated prostate specific antigen [PSA]: Secondary | ICD-10-CM | POA: Diagnosis not present

## 2016-02-10 DIAGNOSIS — N3941 Urge incontinence: Secondary | ICD-10-CM | POA: Diagnosis not present

## 2016-02-22 DIAGNOSIS — F320 Major depressive disorder, single episode, mild: Secondary | ICD-10-CM | POA: Diagnosis not present

## 2016-02-22 DIAGNOSIS — Z79899 Other long term (current) drug therapy: Secondary | ICD-10-CM | POA: Diagnosis not present

## 2016-03-08 ENCOUNTER — Ambulatory Visit (INDEPENDENT_AMBULATORY_CARE_PROVIDER_SITE_OTHER): Payer: BLUE CROSS/BLUE SHIELD | Admitting: Neurology

## 2016-03-08 ENCOUNTER — Encounter: Payer: Self-pay | Admitting: Neurology

## 2016-03-08 VITALS — BP 144/88 | HR 88 | Resp 16

## 2016-03-08 DIAGNOSIS — G8250 Quadriplegia, unspecified: Secondary | ICD-10-CM | POA: Diagnosis not present

## 2016-03-08 DIAGNOSIS — Z5181 Encounter for therapeutic drug level monitoring: Secondary | ICD-10-CM | POA: Diagnosis not present

## 2016-03-08 DIAGNOSIS — R269 Unspecified abnormalities of gait and mobility: Secondary | ICD-10-CM | POA: Diagnosis not present

## 2016-03-08 DIAGNOSIS — G35 Multiple sclerosis: Secondary | ICD-10-CM | POA: Diagnosis not present

## 2016-03-08 HISTORY — DX: Quadriplegia, unspecified: G82.50

## 2016-03-08 NOTE — Progress Notes (Signed)
Reason for visit: Multiple sclerosis  Randy Shaw is an 55 y.o. male  History of present illness:  Randy Shaw is a 55 year old right-handed black male with a history of multiple sclerosis with a spastic quadriparesis. The symptoms are worse on the left side of the body than the right. The patient is essentially nonambulatory, he can stand at times, but he does not walk. The patient has significant spasticity of the left greater than right lower extremities, the baclofen he is on does help some. The patient does have a neurogenic bladder, he is followed through urology. The patient has urgency, some occasional incontinence, he may wear adult diapers when he is outside of the house. The patient has fallen on occasion, the last fall was within the last week. The patient lives alone, but he does have family members who are close by that help him out. The patient denies any significant pain. He does report double vision that is a chronic problem for him. He has noted some numbness in the right hand that has come on in the last 2 weeks. He takes Tecfidera, he tolerates the drug well. The last MRI evaluations were 2 years ago. He reports no new weakness of the extremities or visual changes.  Past Medical History:  Diagnosis Date  . Abnormality of gait 04/22/2013  . Asthma   . BPH (benign prostatic hyperplasia)   . Hypertension   . Multiple sclerosis (HCC)   . 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 none      Family History  Problem Relation Age of Onset  . Cancer Mother     breast  . Stroke Father   . Cancer - Colon Father   . Multiple sclerosis Neg Hx     Social history:  reports that he has never smoked. He has never used smokeless tobacco. He reports that he drinks alcohol. He reports that he does not use drugs.    Allergies  Allergen Reactions  . Peanut-Containing Drug Products Shortness Of Breath  . Zanaflex  [Tizanidine Hcl] Shortness Of Breath    Medications:  Prior to Admission medications   Medication Sig Start Date End Date Taking? Authorizing Provider  baclofen (LIORESAL) 20 MG tablet One tab in the morning, one tab at noon, 2 tabs at night 10/04/15  Yes Levert Feinstein, MD  Dimethyl Fumarate (TECFIDERA) 240 MG CPDR Take 1 capsule (240mg ) by  mouth twice a day 01/20/16  Yes Levert Feinstein, MD  hydrochlorothiazide (MICROZIDE) 12.5 MG capsule Take 1 capsule (12.5 mg total) by mouth daily. 12/27/15  Yes Chelle Jeffery, PA-C  mirabegron ER (MYRBETRIQ) 50 MG TB24 tablet Take 50 mg by mouth daily.   Yes Historical Provider, MD  mirtazapine (REMERON) 15 MG tablet TK 1 T PO  QHS 02/22/16  Yes Historical Provider, MD  tamsulosin (FLOMAX) 0.4 MG CAPS capsule Take 1 capsule (0.4 mg total) by mouth daily. 07/01/14  Yes Tonye Pearson, MD    ROS:  Out of a complete 14 system review of symptoms, the patient complains only of the following symptoms, and all other reviewed systems are negative.  Ringing in the ears Double vision Heat intolerance Insomnia Urinary urgency Walking difficulty Dizziness, speech difficulty, weakness, tremors Depression  Blood pressure (!) 144/88, pulse 88, resp. rate 16.  Physical Exam  General: The patient is alert and cooperative at the time of the examination.  Skin: No significant peripheral edema is  noted.   Neurologic Exam  Mental status: The patient is alert and oriented x 3 at the time of the examination. The patient has apparent normal recent and remote memory, with an apparently normal attention span and concentration ability.   Cranial nerves: Facial symmetry is present. Speech is normal, no aphasia or dysarthria is noted. Extraocular movements are full, with exception that there is incomplete abduction of the left eye with right gaze, consistent with INO. Visual fields are full. Pupils are equal, round, and reactive to light. Discs are flat bilaterally.  Titubations of the head and neck were noted.  Motor: The patient has good strength in the upper extremities. The patient has difficulty with hip flexion on the left greater than right lower extremities, increased motor tone bilaterally, left greater than right. The patient is able to extend the knees bilaterally, he has difficulty relaxing on the left.  Sensory examination: Soft touch sensation is symmetric on the face and arms, decreased on the left leg relative to the right.  Coordination: The patient has dysmetria with finger-nose-finger left greater than right upper extremity, he is unable to perform heel-to-shin on either side.  Gait and station: The patient is nonambulatory, wheelchair bound.  Reflexes: Deep tendon reflexes are symmetric.   Assessment/Plan:  1. Multiple sclerosis  2. Gait disturbance  3. Neurogenic bladder  4. Spastic quadriparesis  The patient will remain on Tecfidera for now. We will recheck MRI of the brain and cervical spine. Blood work will be done today. He will follow-up in 6 months. If progression is noted on MRI, the patient may be a good candidate for Ocrevus.  Marlan Palau. Keith Willis MD 03/08/2016 9:41 AM  [facility] [address] [place], [place] [id number]  Phone [phone] Fax [phone]

## 2016-03-08 NOTE — Patient Instructions (Signed)
   We will check blood work today and get MRI of the brain and cervical spine. 

## 2016-03-09 LAB — COMPREHENSIVE METABOLIC PANEL
A/G RATIO: 1.4 (ref 1.2–2.2)
ALT: 37 IU/L (ref 0–44)
AST: 24 IU/L (ref 0–40)
Albumin: 4.1 g/dL (ref 3.5–5.5)
Alkaline Phosphatase: 79 IU/L (ref 39–117)
BUN / CREAT RATIO: 15 (ref 9–20)
BUN: 13 mg/dL (ref 6–24)
Bilirubin Total: 0.3 mg/dL (ref 0.0–1.2)
CO2: 27 mmol/L (ref 18–29)
Calcium: 9.6 mg/dL (ref 8.7–10.2)
Chloride: 103 mmol/L (ref 96–106)
Creatinine, Ser: 0.87 mg/dL (ref 0.76–1.27)
GFR calc non Af Amer: 96 mL/min/{1.73_m2} (ref >59)
GFR, EST AFRICAN AMERICAN: 112 mL/min/{1.73_m2} (ref >59)
GLOBULIN, TOTAL: 3 g/dL (ref 1.5–4.5)
Glucose: 88 mg/dL (ref 65–99)
Potassium: 4.3 mmol/L (ref 3.5–5.2)
SODIUM: 146 mmol/L — AB (ref 134–144)
TOTAL PROTEIN: 7.1 g/dL (ref 6.0–8.5)

## 2016-03-09 LAB — CBC WITH DIFFERENTIAL/PLATELET
BASOS ABS: 0 10*3/uL (ref 0.0–0.2)
Basos: 1 %
EOS (ABSOLUTE): 0.4 10*3/uL (ref 0.0–0.4)
Eos: 7 %
HEMOGLOBIN: 14 g/dL (ref 12.6–17.7)
Hematocrit: 41.4 % (ref 37.5–51.0)
Immature Grans (Abs): 0 10*3/uL (ref 0.0–0.1)
Immature Granulocytes: 0 %
LYMPHS ABS: 1.9 10*3/uL (ref 0.7–3.1)
Lymphs: 33 %
MCH: 27.3 pg (ref 26.6–33.0)
MCHC: 33.8 g/dL (ref 31.5–35.7)
MCV: 81 fL (ref 79–97)
MONOCYTES: 9 %
MONOS ABS: 0.5 10*3/uL (ref 0.1–0.9)
Neutrophils Absolute: 2.8 10*3/uL (ref 1.4–7.0)
Neutrophils: 50 %
Platelets: 228 10*3/uL (ref 150–379)
RBC: 5.12 x10E6/uL (ref 4.14–5.80)
RDW: 13.1 % (ref 12.3–15.4)
WBC: 5.7 10*3/uL (ref 3.4–10.8)

## 2016-03-10 ENCOUNTER — Telehealth: Payer: Self-pay

## 2016-03-10 NOTE — Telephone Encounter (Signed)
-----   Message from York Spaniel, MD sent at 03/09/2016  4:34 PM EST ----- The blood work results are unremarkable, with exception of a minimally elevated sodium level. Not likely to be clinically significant. Please call the patient. ----- Message ----- From: Nell Range Lab Results In Sent: 03/09/2016   5:41 AM To: York Spaniel, MD

## 2016-03-10 NOTE — Telephone Encounter (Signed)
Called pt w/ lab results. Verbalized understanding and appreciation for call. 

## 2016-03-31 ENCOUNTER — Ambulatory Visit
Admission: RE | Admit: 2016-03-31 | Discharge: 2016-03-31 | Disposition: A | Discharge status: Home / Self Care | Payer: BLUE CROSS/BLUE SHIELD | Source: Ambulatory Visit | Attending: Neurology | Admitting: Neurology

## 2016-03-31 ENCOUNTER — Ambulatory Visit
Admission: RE | Admit: 2016-03-31 | Discharge: 2016-03-31 | Disposition: A | Discharge status: Home / Self Care | Payer: Self-pay | Source: Ambulatory Visit | Attending: Neurology | Admitting: Neurology

## 2016-03-31 DIAGNOSIS — G35 Multiple sclerosis: Secondary | ICD-10-CM | POA: Diagnosis not present

## 2016-03-31 MED ORDER — GADOBENATE DIMEGLUMINE 529 MG/ML IV SOLN
20.0000 mL | Freq: Once | INTRAVENOUS | Status: AC | PRN
  Administered 2016-03-31: 20 mL via INTRAVENOUS

## 2016-04-02 ENCOUNTER — Telehealth: Payer: Self-pay | Admitting: Neurology

## 2016-04-02 NOTE — Telephone Encounter (Signed)
  I called the patient. The MRI studies of the brain and cervical spine are stable. Will stay on the Tecfidera for now.  MRI brain 04/02/16:  IMPRESSION:  This MRI of the brain with and without contrast shows the following: 1.    Multiple infratentorial and supratentorial T2/FLAIR hyperintense foci in a pattern and configuration consistent with chronic demyelinating plaque associated with multiple sclerosis. None of the foci appears to be acute and none of them enhance. When compared to the MRI dated 11/26/2013, there is no interval change. 2.    There are no acute findings. There is a normal enhancement pattern.   MR cervical 04/02/16:  IMPRESSION:  This MRI of the cervical spine with and without contrast shows the following: 1.    Multiple T2 hyperintense foci within the spinal cord on the cervicomedullary junction to the C6 level as detailed above, consistent with chronic demyelinating plaque associated with multiple sclerosis. 2.    There is a normal enhancement pattern. None of the foci appeared to be acute. When compared to the MRI dated 11/26/2013, there is no interval change.

## 2016-06-29 ENCOUNTER — Telehealth: Payer: Self-pay | Admitting: Neurology

## 2016-06-29 DIAGNOSIS — G8250 Quadriplegia, unspecified: Secondary | ICD-10-CM

## 2016-06-29 DIAGNOSIS — G35 Multiple sclerosis: Secondary | ICD-10-CM

## 2016-06-29 NOTE — Telephone Encounter (Signed)
I'll be happy to write the prescription, but the patient likely will need a formal mobility assessment evaluation through our office or through a physical therapy center.

## 2016-06-29 NOTE — Telephone Encounter (Signed)
Patient calling to get an order for a motorized wheelchair.

## 2016-06-29 NOTE — Telephone Encounter (Signed)
Dr. Willis- are you ok with this? 

## 2016-06-29 NOTE — Addendum Note (Signed)
Addended by: Stephanie Acre on: 06/29/2016 05:12 PM   Modules accepted: Orders

## 2016-06-30 NOTE — Telephone Encounter (Signed)
Called and spoke with pt. Advised CW,MD wrote rx for wheelchair. Made appt for 07/04/16 at 245pm with CM,NP to have f/u for need for wheelchair. He will call back to let us know if he needs this appt or not.

## 2016-06-30 NOTE — Telephone Encounter (Signed)
Called patient to check to see if he found out if he needed appt or not. He LVM and is waiting to hear back. He is planning on coming to appt at this point. Advised him to call if he hears otherwise first thing Monday. He verbalized understanding.

## 2016-07-04 ENCOUNTER — Ambulatory Visit (INDEPENDENT_AMBULATORY_CARE_PROVIDER_SITE_OTHER): Payer: BLUE CROSS/BLUE SHIELD | Admitting: Nurse Practitioner

## 2016-07-04 ENCOUNTER — Encounter: Payer: Self-pay | Admitting: Nurse Practitioner

## 2016-07-04 VITALS — BP 129/83 | HR 84 | Ht 75.0 in

## 2016-07-04 DIAGNOSIS — R269 Unspecified abnormalities of gait and mobility: Secondary | ICD-10-CM

## 2016-07-04 DIAGNOSIS — G35 Multiple sclerosis: Secondary | ICD-10-CM

## 2016-07-04 NOTE — Patient Instructions (Signed)
Continue tecfidera  at current dose Reviewed recent lab work Will attempt to get  motorized wheelchair Follow-up as already scheduled

## 2016-07-04 NOTE — Progress Notes (Signed)
[facility]  PATIENT: Randy Shaw DOB: [birth year]   REASON FOR VISIT: Follow up for MS he is here to be evaluated for motorized wheelchair for medical necessity HISTORY FROM: Patient    HISTORY OF PRESENT ILLNESS: Randy Shaw, 56 year old male returns for follow-up for history of multiple sclerosis and evaluation of a motorized wheelchair. Patient is nonambulatory. He has a spastic quadriparesis. His symptoms are worse on the left side of the body than the right. He does not ambulate. He has a neurogenic bladder. The patient continues to live alone with family members checking in on him. He reports some double vision that is a chronic problem for him. The patient falls on occasion and is unsafe with ambulation. These falls could lead to other injuries. He is willing and mentally capable of operating a power chair. He remains on tecfidera for his multiple sclerosis. Due to his multiple sclerosis likely that his condition will continue to deteriorate over time and his functional level will continue to decline. Recent labs reviewed he returns for reevaluation    HISTORY 03/08/16 KWMr. Shaw is a 56 year old right-handed black male with a history of multiple sclerosis with a spastic quadriparesis. The symptoms are worse on the left side of the body than the right. The patient is essentially nonambulatory, he can stand at times, but he does not walk. The patient has significant spasticity of the left greater than right lower extremities, the baclofen he is on does help some. The patient does have a neurogenic bladder, he is followed through urology. The patient has urgency, some occasional incontinence, he may wear adult diapers when he is outside of the house. The patient has fallen on occasion, the last fall was within the last week. The patient lives alone, but he does have family members who are close by that help him out. The patient denies any significant pain. He does report  double vision that is a chronic problem for him. He has noted some numbness in the right hand that has come on in the last 2 weeks. He takes Tecfidera, he tolerates the drug well. The last MRI evaluations were 2 years ago. He reports no new weakness of the extremities or visual changes.    REVIEW OF SYSTEMS: Full 14 system review of systems performed and notable only for those listed, all others are neg:  Constitutional: neg  Cardiovascular: neg Ear/Nose/Throat: neg  Skin: neg Eyes: Double vision Respiratory: neg Gastroitestinal: Neurogenic bladder Hematology/Lymphatic: neg  Endocrine: neg Musculoskeletal: Nonambulatory Allergy/Immunology: neg Neurological: Tremors Psychiatric: neg Sleep : neg   ALLERGIES: Allergies  Allergen Reactions  . Peanut-Containing Drug Products Shortness Of Breath  . Zanaflex [Tizanidine Hcl] Shortness Of Breath    HOME MEDICATIONS: Outpatient Medications Prior to Visit  Medication Sig Dispense Refill  . baclofen (LIORESAL) 20 MG tablet One tab in the morning, one tab at noon, 2 tabs at night 120 tablet 11  . Dimethyl Fumarate (TECFIDERA) 240 MG CPDR Take 1 capsule (240mg ) by  mouth twice a day 60 capsule 5  . hydrochlorothiazide (MICROZIDE) 12.5 MG capsule Take 1 capsule (12.5 mg total) by mouth daily. 30 capsule 5  . mirabegron ER (MYRBETRIQ) 50 MG TB24 tablet Take 50 mg by mouth daily.    . mirtazapine (REMERON) 15 MG tablet TK 1 T PO  QHS  1  . tamsulosin (FLOMAX) 0.4 MG CAPS capsule Take 1 capsule (0.4 mg total) by mouth daily. 30 capsule 0   No facility-administered medications prior to  visit.     PAST MEDICAL HISTORY: Past Medical History:  Diagnosis Date  . Abnormality of gait 04/22/2013  . Asthma   . BPH (benign prostatic hyperplasia)   . Hypertension   . Multiple sclerosis (HCC)   . 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    PAST SURGICAL HISTORY: Past Surgical History:  Procedure Laterality Date  . none      FAMILY  HISTORY: Family History  Problem Relation Age of Onset  . Cancer Mother     breast  . Stroke Father   . Cancer - Colon Father   . Multiple sclerosis Neg Hx     SOCIAL HISTORY: Social History   Social History  . Marital status: Divorced    Spouse name: n/a  . Number of children: 3  . Years of education: 12   Occupational History  . disabled/retired 1999-[employer]     [employer]   Social History Main Topics  . Smoking status: Never Smoker  . Smokeless tobacco: Never Used  . Alcohol use 0.0 oz/week     Comment: occasionally wine cooler  . Drug use: No  . Sexual activity: Not on file   Other Topics Concern  . Not on file   Social History Narrative   Lives with his grandmother     PHYSICAL EXAM  Vitals:   07/04/16 1445  BP: 129/83  Pulse: 84  Height: 6\' 3"  (1.905 m)   There is no height or weight on file to calculate BMI.  Generalized: Well developed, in no acute distress  Head: normocephalic and atraumatic,. Oropharynx benign  Neck: Supple, no carotid bruits  Musculoskeletal: No deformity   Neurological examination   Mentation: Alert oriented to time, place, history taking. Attention span and concentration appropriate. Recent and remote memory intact.  Follows all commands speech and language fluent.   Cranial nerve II-XII: Pupils were equal round reactive to light extraocular movements were full with the exception that there is incomplete abduction of the left eye with right gaze consistent with INO, visual field were full on confrontational test. Facial sensation and strength were normal. hearing was intact to finger rubbing bilaterally. Uvula tongue midline. head turning and shoulder shrug were normal and symmetric.Tongue protrusion into cheek strength was normal. Titubation of the head and neck are noted Motor: normal bulk and tone, full strength in the BUE,, difficulty with hip flexion on the left greater than the right increased tone bilaterally  left greater than right. The patient is able to extend his knees bilaterally he has difficulty relaxing on the left lower extremity BLE, fSensory: normal and symmetric to light touch, pinprick, and  Vibration, proprioception  Coordination: Dysmetria with finger to nose left greater than right upper extremity, he is unable to perform heel-to-shin on either side  Reflexes: Symmetric upper and lower plantar responses were flexor bilaterally. Gait and Station: Nonambulatory, wheelchair bound   DIAGNOSTIC DATA (LABS, IMAGING, TESTING) - I reviewed patient records, labs, notes, testing and imaging myself where available.  Lab Results  Component Value Date   WBC 5.7 03/08/2016   HGB 13.7 05/08/2014   HCT 41.4 03/08/2016   MCV 81 03/08/2016   PLT 228 03/08/2016      Component Value Date/Time   NA 146 (H) 03/08/2016 1008   K 4.3 03/08/2016 1008   CL 103 03/08/2016 1008   CO2 27 03/08/2016 1008   GLUCOSE 88 03/08/2016 1008   GLUCOSE 96 07/30/2013 0952   BUN 13 03/08/2016 1008  CREATININE 0.87 03/08/2016 1008   CREATININE 0.89 07/30/2013 0952   CALCIUM 9.6 03/08/2016 1008   PROT 7.1 03/08/2016 1008   ALBUMIN 4.1 03/08/2016 1008   AST 24 03/08/2016 1008   ALT 37 03/08/2016 1008   ALKPHOS 79 03/08/2016 1008   BILITOT 0.3 03/08/2016 1008   GFRNONAA 96 03/08/2016 1008   GFRNONAA >89 07/30/2013 0952   GFRAA 112 03/08/2016 1008   GFRAA >89 07/30/2013 0952   Lab Results  Component Value Date   CHOL 196 07/30/2013   HDL 57 07/30/2013   LDLCALC 117 (H) 07/30/2013   TRIG 111 07/30/2013   CHOLHDL 3.4 07/30/2013    ASSESSMENT AND PLAN  56 y.o. year old male  has a past medical history of Abnormality of gait Multiple sclerosis (HCC); and Spastic quadriplegia and neurogenic bladder (HCC) (03/08/2016). here here for assessment for motorized wheelchair   PLAN: Continue tecfidera  at current dose Reviewed recent lab work Will attempt to get  motorized wheelchair Face-to-face exam  performed Follow-up as already scheduled  Nilda Riggs, [facility], [facility], APRN  [facility] [address] [place], [place] [address] [phone]

## 2016-07-04 NOTE — Progress Notes (Signed)
I have read the note, and I agree with the clinical assessment and plan.  Leviticus Harton,Kienan KEITH   

## 2016-07-05 NOTE — Addendum Note (Signed)
Addended by: Geronimo Running A on: 07/05/2016 05:08 PM   Modules accepted: Orders

## 2016-07-06 ENCOUNTER — Telehealth: Payer: Self-pay

## 2016-07-06 NOTE — Telephone Encounter (Signed)
Rn call [facility] and a rep stated they do not process or supply motorized wheelchair. Rn was told to call [facility].

## 2016-07-06 NOTE — Telephone Encounter (Signed)
Rn call patient about dme order for motorized wheelchair. Rn confirmed with pt that he wants the information fax to [facility] on [address]. Pt confirmed the dme store was on [address]. Rn stated information will be fax there. PT verbalized understanding.

## 2016-07-06 NOTE — Telephone Encounter (Signed)
Patient calling back stating correct fax number for [facility] is [phone].

## 2016-07-06 NOTE — Telephone Encounter (Addendum)
Paperwork already sent to [facility].Rn call  [phone] ext [phone]. They stated to fax insurance card, office note stating necessity,and dme order. They will take care of the authorization and contact the pt. Everything fax to [facility] to [phone].

## 2016-07-06 NOTE — Telephone Encounter (Signed)
Rn call patient back to let him know his paperwork and orders will be going to [facility]. Rn explain [facility] does not approve motorized wheelchairs or supply them. Pt verbalized understanding.

## 2016-07-11 ENCOUNTER — Other Ambulatory Visit: Payer: Self-pay | Admitting: Neurology

## 2016-07-21 ENCOUNTER — Other Ambulatory Visit: Payer: Self-pay | Admitting: Physician Assistant

## 2016-07-21 DIAGNOSIS — I10 Essential (primary) hypertension: Secondary | ICD-10-CM

## 2016-08-11 ENCOUNTER — Ambulatory Visit (INDEPENDENT_AMBULATORY_CARE_PROVIDER_SITE_OTHER): Payer: BLUE CROSS/BLUE SHIELD | Admitting: Family Medicine

## 2016-08-11 VITALS — BP 168/88 | HR 97 | Temp 97.8°F | Resp 17 | Ht 75.0 in | Wt 221.0 lb

## 2016-08-11 DIAGNOSIS — N138 Other obstructive and reflux uropathy: Secondary | ICD-10-CM

## 2016-08-11 DIAGNOSIS — N401 Enlarged prostate with lower urinary tract symptoms: Secondary | ICD-10-CM | POA: Diagnosis not present

## 2016-08-11 DIAGNOSIS — G35 Multiple sclerosis: Secondary | ICD-10-CM | POA: Diagnosis not present

## 2016-08-11 DIAGNOSIS — I10 Essential (primary) hypertension: Secondary | ICD-10-CM | POA: Diagnosis not present

## 2016-08-11 DIAGNOSIS — G8250 Quadriplegia, unspecified: Secondary | ICD-10-CM

## 2016-08-11 MED ORDER — HYDROCHLOROTHIAZIDE 12.5 MG PO CAPS: ORAL_CAPSULE | ORAL | 1 refills | Status: DC

## 2016-08-11 NOTE — Patient Instructions (Addendum)
Your Flomax and Myrbetriq should have refills as one year of refills were provided by urology in October 2017.  I reviewed your lab work from neurology, so do not need to do blood testing today. However would recommend they check your electrolytes in May if blood work is being drawn. Primarily I am looking at your sodium, potassium, and kidney function.  Please establish with a primary care provider at our office and schedule a physical within the next 6 months to review other health maintenance screenings. Let me know if you have questions in the meantime   IF you received an x-ray today, you will receive an invoice from [facility]. Please contact [facility] at [phone] with questions or concerns regarding your invoice.   IF you received labwork today, you will receive an invoice from [place]. Please contact [facility] at [phone] with questions or concerns regarding your invoice.   Our billing staff will not be able to assist you with questions regarding bills from these companies.  You will be contacted with the lab results as soon as they are available. The fastest way to get your results is to activate your My Chart account. Instructions are located on the last page of this paperwork. If you have not heard from [place] regarding the results in 2 weeks, please contact this office.

## 2016-08-11 NOTE — Progress Notes (Signed)
Subjective:  By signing my name below, I, Randy Shaw, attest that this documentation has been prepared under the direction and in the presence of Randy Flood, MD Electronically Signed: Charline Shaw, ED Scribe 08/11/2016 at 4:00 PM.   Patient ID: Randy Shaw, male    DOB: [birth year], 56 y.o.   MRN: [id number]  Chief Complaint  Patient presents with  . Medication Refill    all on list    HPI Randy Shaw is a 56 y.o. male who presents to Primary Care at [facility] for a medication refill of Myrbetriq. New pt to me. Most recently seen by Randy Shaw on August 28. Has a h/o multiple sclerosis, quadriplegia and quadriparesis, BPH, neurogenic bladder, HTN. Followed by [facility] Dr. Terrace Shaw but most recent visit March 6 with Randy Dove, NP with [facility]. Recently prescribed a motorized wheelchair. Continued on Tecfidera. Also been prescribed baclofen 4 times/day by neurology.   HTN Lab Results  Component Value Date   CREATININE 0.87 03/08/2016  Pt takes HCTZ 12.5 mg qd. He states that he does not check his BP outside of the office. Pt denies chest pain, sob, light-headedness, dizziness.   Neurogenic Bladder H/o BPH. Pt takes Myrbetriq 50 mg daily. Pt denies a h/o prostate CA. He is followed by his urologist Randy Shaw, last visit was 02/10/16. Previous office visits and records were reviewed. Pt was taking Flomax 0.4 mg qd and Myrbetriq 50 mq daily at that visit. On 02/11/16 pt was given 11 refills.   Lab Results  Component Value Date   PSA 0.70 07/30/2013   Patient Active Problem List   Diagnosis Date Noted  . 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  . Abnormality of gait 04/22/2013  . HTN (hypertension) 06/29/2012  . Multiple sclerosis (HCC) 06/29/2012  . BPH (benign prostatic hypertrophy) with urinary obstruction 06/29/2012   Past Medical History:  Diagnosis Date  . Abnormality of gait 04/22/2013  . Asthma   . BPH (benign prostatic  hyperplasia)   . Hypertension   . Multiple sclerosis (HCC)   . 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   Past Surgical History:  Procedure Laterality Date  . none     Allergies  Allergen Reactions  . Peanut-Containing Drug Products Shortness Of Breath  . Zanaflex [Tizanidine Hcl] Shortness Of Breath   Prior to Admission medications   Medication Sig Start Date End Date Taking? Authorizing Provider  baclofen (LIORESAL) 20 MG tablet One tab in the morning, one tab at noon, 2 tabs at night 10/04/15  Yes Randy Feinstein, MD  hydrochlorothiazide (MICROZIDE) 12.5 MG capsule TAKE 1 CAPSULE(12.5 MG) BY MOUTH DAILY 07/21/16  Yes Randy Jeffery, PA-C  mirabegron ER (MYRBETRIQ) 50 MG TB24 tablet Take 50 mg by mouth daily.   Yes Historical Provider, MD  mirtazapine (REMERON) 15 MG tablet TK 1 T PO  QHS 02/22/16  Yes Historical Provider, MD  tamsulosin (FLOMAX) 0.4 MG CAPS capsule Take 1 capsule (0.4 mg total) by mouth daily. 07/01/14  Yes Randy Pearson, MD  TECFIDERA 240 MG CPDR TAKE 1 CAPSULE BY MOUTH TWICE DAILY 07/11/16  Yes Randy Feinstein, MD   Social History   Social History  . Marital status: Divorced    Spouse name: n/a  . Number of children: 3  . Years of education: 12   Occupational History  . disabled/retired 1999-[employer]     [employer]   Social History Main Topics  . Smoking status: Never Smoker  . Smokeless tobacco:  Never Used  . Alcohol use 0.0 oz/week     Comment: occasionally wine cooler  . Drug use: No  . Sexual activity: No   Other Topics Concern  . Not on file   Social History Narrative   Lives with his grandmother   Review of Systems  Respiratory: Negative for shortness of breath.   Cardiovascular: Negative for chest pain.  Neurological: Negative for dizziness and light-headedness.      Objective:   Physical Exam  Constitutional: He is oriented to person, place, and time. He appears well-developed and well-nourished.  HENT:  Head: Normocephalic  and atraumatic.  Eyes: EOM are normal. Pupils are equal, round, and reactive to light.  Neck: No JVD present. Carotid bruit is not present.  Cardiovascular: Normal rate, regular rhythm and normal heart sounds.   No murmur heard. Pulmonary/Chest: Effort normal and breath sounds normal. He has no rales.  Musculoskeletal: He exhibits no edema.  Neurological: He is alert and oriented to person, place, and time.  Skin: Skin is warm and dry.  Psychiatric: He has a normal mood and affect.  Vitals reviewed.  Vitals:   08/11/16 1507  BP: (!) 168/88  Pulse: 97  Resp: 17  Temp: 97.8 F (36.6 C)  TempSrc: Oral  SpO2: 98%  Weight: 221 lb (100.2 kg)  Height: 6\' 3"  (1.905 m)      Assessment & Plan:   Randy Shaw is a 56 y.o. male Multiple sclerosis (HCC)  - Followed by neurology. Continue routine follow-up.  Baclofen, Remeron, Tecfidera prescribed by neurology   Benign prostatic hyperplasia with urinary obstruction  - Outside office note reviewed, plan for continued Myrbetriq and Flomax at last visit with urology, and 11 refills were provided at that visit. Should have refills left on both medications.  Quadriplegia and quadriparesis (HCC)  - Followed by neurology as above, and with motorized wheelchair evaluation recently for new chair.  Essential hypertension - Plan: hydrochlorothiazide (MICROZIDE) 12.5 MG capsule  -Slightly elevated in office. Continue same dose of HCTZ for now, but monitor outside of office. Advised to establish with primary care provider at our office with physical in the next 6 months. I reviewed lab work from neurology, and planning on having labs drawn through neurology within the next month. No labs ordered at present.  Meds ordered this encounter  Medications  . hydrochlorothiazide (MICROZIDE) 12.5 MG capsule    Sig: TAKE 1 CAPSULE(12.5 MG) BY MOUTH DAILY    Dispense:  90 capsule    Refill:  1    Needs office visit for refills   Patient Instructions    Your Flomax and Myrbetriq should have refills as one year of refills were provided by urology in October 2017.  I reviewed your lab work from neurology, so do not need to do blood testing today. However would recommend they check your electrolytes in May if blood work is being drawn. Primarily I am looking at your sodium, potassium, and kidney function.  Please establish with a primary care provider at our office and schedule a physical within the next 6 months to review other health maintenance screenings. Let me know if you have questions in the meantime   IF you received an x-ray today, you will receive an invoice from [facility]. Please contact [facility] at [phone] with questions or concerns regarding your invoice.   IF you received labwork today, you will receive an invoice from [place]. Please contact [facility] at [phone] with questions or concerns regarding  your invoice.   Our billing staff will not be able to assist you with questions regarding Shaw from these companies.  You will be contacted with the lab results as soon as they are available. The fastest way to get your results is to activate your My Chart account. Instructions are located on the last page of this paperwork. If you have not heard from [place] regarding the results in 2 weeks, please contact this office.       I personally performed the services described in this documentation, which was scribed in my presence. The recorded information has been reviewed and considered for accuracy and completeness, addended by me as needed, and agree with information above.  Signed,   Meredith Staggers, MD Primary Care at [facility].  08/13/16 9:14 PM

## 2016-08-18 ENCOUNTER — Other Ambulatory Visit: Payer: Self-pay | Admitting: Physician Assistant

## 2016-08-18 DIAGNOSIS — I10 Essential (primary) hypertension: Secondary | ICD-10-CM

## 2016-08-23 ENCOUNTER — Ambulatory Visit: Payer: BLUE CROSS/BLUE SHIELD | Attending: Nurse Practitioner | Admitting: Physical Therapy

## 2016-08-23 DIAGNOSIS — R29818 Other symptoms and signs involving the nervous system: Secondary | ICD-10-CM | POA: Diagnosis not present

## 2016-08-23 DIAGNOSIS — M6281 Muscle weakness (generalized): Secondary | ICD-10-CM

## 2016-08-23 DIAGNOSIS — R2689 Other abnormalities of gait and mobility: Secondary | ICD-10-CM

## 2016-08-24 NOTE — Therapy (Signed)
[facility] [address] [place], [place], [address] Phone: [phone]   Fax:  [phone]  Physical Therapy Evaluation  Patient Details  Name: Randy Shaw MRN: [id number] Date of Birth: [birth year] Referring Provider: Dennie Bible, NP  Encounter Date: 08/23/2016      PT End of Session - 08/24/16 1918    Visit Number 1   Number of Visits 1   Authorization Type BCBS   PT Start Time 0922   PT Stop Time 0737   PT Time Calculation (min) 73 min      Past Medical History:  Diagnosis Date  . Abnormality of gait 04/22/2013  . Asthma   . BPH (benign prostatic hyperplasia)   . Hypertension   . Multiple sclerosis ([place])   . Quadriplegia and quadriparesis ([place]) 03/08/2016    Past Surgical History:  Procedure Laterality Date  . none      There were no vitals filed for this visit.       Subjective Assessment - 08/24/16 1849    Subjective Pt presents to PT eval in power wheelchair which he has been given - vendor Josh Cadle, [employer] from [facility] present for eval   Pertinent History MS   Patient Stated Goals obtain new power wheelchair   Currently in Pain? No/denies            [facility] PT Assessment - 08/24/16 0001      Assessment   Medical Diagnosis Multiple Sclerosis   Referring Provider Dennie Bible, NP   Onset Date/Surgical Date --  2010     Precautions   Precautions Fall        Mobility/Seating Evaluation    PATIENT INFORMATION: Name: Randy Shaw DOB: [birth year]  Sex: M Date seen: 08-23-16 Time: 0930  Address:  [address]                 [place], [place] [address] Physician: Dennie Bible, NP This evaluation/justification form will serve as the LMN for the following suppliers: __________________________ Supplier: [facility] Contact Person: Casper Harrison Phone:  [phone]   Seating Therapist: Guido Sander, PT Phone:   [phone]   Phone: [phone]     Spouse/Parent/Caregiver name: ?????  Phone number: ????? Insurance/Payer: [place]     Reason for Referral: power wheelchair evaluation  Patient/Caregiver Goals: obtain new power wheelchair  Patient was seen for face-to-face evaluation for new power wheelchair.  Also present was [employer], [employer] to discuss recommendations and wheelchair options.  Further paperwork was completed and sent to vendor.  Patient appears to qualify for power mobility device at this time per objective findings.   MEDICAL HISTORY: Diagnosis: Primary Diagnosis: Multiple Sclerosis with quadriparesis Onset: 1995 - diagnosed  Diagnosis: HTN   _0 Progressive Disease Relevant past and future surgeries: ?????   Height: 6'3" Weight: 200# Explain recent changes or trends in weight: ?????   History including Falls: Pt has used a power wheelchair since 2005; pt reports he has been nonambulatory since 2010.  Pt reports he has had approx. 10+ falls in past 6 months, with none being injurious falls.     HOME ENVIRONMENT: _1 House  _2 Condo/town home  _3 Apartment  _4 Assisted Living    _5 Lives Alone _6  Lives with Others  Hours with caregiver: ?????  _0 Home is accessible to patient           Stairs      _1 Yes _2  No     Ramp _3 Yes _4 No Comments:  ?????   COMMUNITY ADL: TRANSPORTATION: _5 Car    _6 Van    <YIRSWNIOEVOJJKKX>_3<\/GHWEXHBZJIRCVELF>_8 Public Transportation    _8 Adapted w/c Lift    _9 Ambulance    _10 Other:       _11 Sits in wheelchair during transport  Employment/School: ????? Specific requirements pertaining to mobility ?????  Other: ?????    FUNCTIONAL/SENSORY PROCESSING SKILLS:  Handedness:   _12 Right     _13 Left    _14 NA  Comments:  ?????  Functional Processing Skills for Wheeled Mobility _15 Processing Skills are adequate for safe wheelchair operation  Areas of concern than may interfere with safe operation of wheelchair Description of problem   _16  Attention to  environment      _17 Judgment      _18  Hearing  _19  Vision or visual processing      _20 Motor Planning  _21  Fluctuations in Behavior  ?????    VERBAL COMMUNICATION: _22 WFL receptive _23  WFL expressive _24 Understandable  _25 Difficult to understand  _26 non-communicative _27  Uses an augmented communication device  CURRENT SEATING / MOBILITY: Current Mobility Base:  _28 None _29 Dependent _30 Manual _31 Scooter _32 Power  Type of Control: ?????  Manufacturer:  Terrilee Files Select Size:  ?????Age: ?????  Current Condition of Mobility Base:  in disrepair - pt was given this wheelchair - was not purchased by insurance co.    Current Wheelchair components:  ?????  Describe posture in present seating system:  ?????      SENSATION and SKIN ISSUES: Sensation _33 Intact  _34 Impaired _35 Absent  Level of sensation: mid- thoracic level distally; tingling in bil. feet Pressure Relief: Able to perform effective pressure relief :    _36 Yes  _37  No Method: ???? If not, Why?: ?????  Skin Issues/Skin Integrity Current Skin Issues  _38 Yes _39 No _40 Intact _41  Red area_42  Open Area  _43 Scar Tissue _44 At risk from prolonged sitting Where  ?????  History of Skin Issues  _45 Yes _46 No Where  ????? When  ?????  Hx of skin flap surgeries  _47 Yes _48 No Where  ????? When  ?????  Limited sitting tolerance _49 Yes _50 No Hours spent sitting in wheelchair daily: 12+  Complaint of Pain:  Please describe: None   Swelling/Edema: Edema in Lt ankle   ADL STATUS (in reference to wheelchair use):  Indep Assist Unable Indep with Equip Not assessed Comments  Dressing ????? ????? ????? X ????? performs from bed  Eating X ????? ????? ????? ????? ?????  Toileting ????? ????? ????? X ????? grab bars; uses elevated toilet; also uses urinal; wears Depends  Bathing ????? ????? ????? X ????? currently doing sponge baths; when uses shower he leans against the wall for stability  Grooming/Hygiene ????? ????? ????? X ????? leans against sink or cabinet for  stability  Meal Prep ????? ????? ????? X ????? performs from wheelchair  IADLS ????? ????? ????? X ????? uses wheelchair; SCAT for transportation  Bowel Management: _51 Continent  _52 Incontinent  _53 Accidents Comments:  ?????  Bladder Management: _54 Continent  _55 Incontinent  _56 Accidents Comments:  occasional urgency incontinence     WHEELCHAIR SKILLS: Manual w/c Propulsion: _57 UE or LE strength and endurance sufficient to participate in ADLs using manual wheelchair Arm : _58 left _59 right   _60 Both      Distance: ????? Foot:  _61 left _62 right   _63 Both  Operate Scooter: _64  Strength, hand grip, balance and transfer appropriate for  use _0 Living environment is accessible for use of scooter  Operate Power w/c:  _1  Std. Joystick   _2  Alternative Controls Indep _3  Assist _4  Dependent/unable _5  N/A _6   _7 Safe          _8  Functional      Distance: ?????  Bed confined without wheelchair _9  Yes _10  No   STRENGTH/RANGE OF MOTION:  Active  Range of Motion Strength  Shoulder Rt shoulder flexion 124 degrees:  abdct. 126 degrees Lt shoulder flexion 89 degrees;  abdct. 86 degrees 4/5 in available AROM; 3-/5 bil. UE's based on active ROM  Elbow Bil. UE's WFL's for flexion and extension WFL's bil. UE's  Wrist/Hand bil. UE's WFL's for flexion, extension and grip strength;  decreased extension at PIP 3rd digit on Rt hand WFL's bil. UE's  Hip Rt hip flexors WFL Rt hip flexors 4/5;  Lt hip musc. 2 - 2+/5 throughout  Knee Rt knee extensors and flexors WFL Lt knee extension WFL's with effort due to extensor tone; Lt knee flexion to 75 degrees actively Rt quads 5/5 with extensor tone noted; Rt knee flexors   Ankle RLE WFL's No AROM in Lt ankle RLE WFL     MOBILITY/BALANCE:  _11  Patient is totally dependent for mobility  ?????    Balance Transfers Ambulation  Sitting Balance: Standing Balance: _12  Independent _13  Independent/Modified Independent  _14  WFL     _15  WFL _16  Supervision _17  Supervision  _18  Uses UE for  balance  _19  Supervision _20  Min Assist _21  Ambulates with Assist  ?????    _22  Min Assist _23  Min assist _24  Mod Assist _25  Ambulates with Device:      _26  RW  _27  StW  _28  Cane  _29  ?????  _30  Mod Assist _31  Mod assist _32  Max assist   _33  Max Assist _34  Max assist _35  Dependent _36  Indep. Short Distance Only  _37  Unable _38  Unable _39  Lift / Sling Required Distance (in feet)  ?????   _40  Sliding board _41  Unable to Ambulate (see explanation below)  Cardio Status:  _42 Intact  _43  Impaired   _44  NA     ?????  Respiratory Status:  _45 Intact   _46 Impaired   _47 NA     ?????  Orthotics/Prosthetics: Pt received AFO for LLE in 2005  Comments (Address manual vs power w/c vs scooter): Pt is unable to functionally and effectively propel a manual wheelchair due to decreased AROM in LUE and decreased strength.           Anterior / Posterior Obliquity Rotation-Pelvis Decr. lumbar lordosis  PELVIS    _48  _49  _50   Neutral Posterior Anterior  _51  _52  _53   WFL Rt elev Lt elev  _54  _55  _56   WFL Right Left                      Anterior    Anterior     _57  Fixed _58  Other _59  Partly Flexible _60  Flexible   _61  Fixed _62  Other _63  Partly Flexible  _64  Flexible  _65  Fixed _66  Other _67  Partly Flexible  _68  Flexible   TRUNK  _69  _70  _71   WFL ? Thoracic ? Lumbar  Kyphosis Lordosis  _72  _73  _74   [facility] Convex Convex  Right Left _75 c-curve _76 s-curve _77 multiple  _78  Neutral _79  Left-anterior _80  Right-anterior     _81  Fixed _82  Flexible _83  Partly Flexible _84  Other  _85  Fixed _86  Flexible _87  Partly Flexible _88  Other  _89  Fixed             [  x] Flexible _0  Partly Flexible _1  Other    Position Windswept  Extensor tone/spasticity limits flexibility and ease of movement  HIPS          _2            _3               _4    Neutral       Abduct        ADduct         _5           _6            _7   Neutral Right           Left      _8  Fixed _9  Subluxed _10  Partly Flexible _11  Dislocated _12  Flexible  _13  Fixed _14  Other _15  Partly Flexible  _16  Flexible                  Foot Positioning Knee Positioning  No AROM LLE in ankles; Extensor tone > in LLE than RLE    _17  WFL  _18 Lt _19 Rt _20  WFL  _21 Lt _22 Rt    KNEES ROM concerns: ROM concerns:    & Dorsi-Flexed _23 Lt _24 Rt ?????    FEET Plantar Flexed _25 Lt _26 Rt      Inversion                 _27 Lt _28 Rt      Eversion                 _29 Lt _30 Rt     HEAD _31  Functional _32  Good Head Control  ?????  & _33  Flexed         _34  Extended _35  Adequate Head Control    NECK _36  Rotated  Lt  _37  Lat Flexed Lt _38  Rotated  Rt _39  Lat Flexed Rt _40  Limited Head Control     _41  Cervical Hyperextension _42  Absent  Head Control     SHOULDERS ELBOWS WRIST& HAND trigger finger on Rt hand - 3rd finger      Left     Right    Left     Right    Left     Right   U/E _43 Functional           _44 Functional WFL WFL _45 Fisting             _46 Fisting      _47 elev   _48 dep      _49 elev   _50 dep       _51 pro -_52 retract     _53 pro  _54 retract _55 subluxed             _56 subluxed           Goals for Wheelchair Mobility  _57  Independence with mobility in the home with motor related ADLs (MRADLs)  _58  Independence with MRADLs in the community _59  Provide dependent mobility  _60  Provide recline     _61 Provide tilt   Goals for Seating system _62  Optimize pressure distribution _63  Provide support needed to facilitate function or safety _64  Provide corrective forces to assist with maintaining or improving posture _65  Accommodate client's posture:   current seated postures and positions are not flexible or will not tolerate corrective forces _66  Client to be independent with relieving pressure in the wheelchair _67 Enhance physiological function such as breathing, swallowing, digestion  Simulation ideas/Equipment trials:????? State why other equipment was unsuccessful:?????   MOBILITY BASE RECOMMENDATIONS and JUSTIFICATION: MOBILITY COMPONENT JUSTIFICATION  Manufacturer: [employer] Model: Q6  Edge 2.0   Size: Width 18Seat Depth 20 _0 provide transport  from point A to B      _1 promote Indep mobility  _2 is not a safe, functional ambulator _3 walker or cane inadequate _4 non-standard width/depth necessary to accommodate anatomical measurement _5  ?????  _6 Manual Mobility Base _7 non-functional ambulator    _8 Scooter/POV  _9 can safely operate  _10 can safely transfer   _11 has adequate trunk stability  _12 cannot functionally propel manual w/c  _13 Power Mobility Base  _14 non-ambulatory  _15 cannot functionally propel manual wheelchair  _16  cannot functionally and safely operate scooter/POV _17 can safely operate and willing to  _18 Stroller Base _19 infant/child  _20 unable to propel manual wheelchair _21 allows for growth _22 non-functional ambulator _23 non-functional UE _24 Indep mobility is not a goal at this time  _25 Tilt  _26 Forward _27 Backward _28 Powered tilt  _29 Manual tilt  _30 change position against gravitational force on head and shoulders  _31 change position for pressure relief/cannot weight shift _32 transfers  _33 management of tone _34 rest periods _35 control edema _36 facilitate postural control  _37  ?????  _38 Recline  _39 Power recline on power base _40 Manual recline on manual base  _41 accommodate femur to back angle  _42 bring to full recline for ADL care  _43 change position for pressure relief/cannot weight shift _44 rest periods _45 repositioning for transfers or clothing/diaper /catheter changes _46 head positioning  _47 Lighter weight required _48 self- propulsion  _49 lifting _50  ?????  _51 Heavy Duty required _52 user weight greater than 250# _53 extreme tone/ over active movement _54 broken frame on previous chair _55  ?????  _56  Back  _57  Angle Adjustable _58  Custom molded Sport Back _59 postural control _60 control of tone/spasticity _61 accommodation of range of motion _62 UE functional control _63 accommodation for seating system _64  ????? _65 provide lateral trunk support _66 accommodate deformity _67 provide posterior trunk support _68 provide lumbar/sacral  support _69 support trunk in midline _70 Pressure relief over spinal processes  _71  Seat Cushion Solution _72 impaired sensation  _73 decubitus ulcers present _74 history of pressure ulceration _75 prevent pelvic extension _76 low maintenance  _77 stabilize pelvis  _78 accommodate obliquity _79 accommodate multiple deformity _80 neutralize lower extremity position _81 increase pressure distribution _82  ?????  _83  Pelvic/thigh support  _84  Lateral thigh guide _85  Distal medial pad  _86  Distal lateral pad _87  pelvis in neutral _88 accommodate pelvis _89  position upper legs _90  alignment _91  accommodate ROM _92  decr adduction _93 accommodate tone _94 removable for transfers _95 decr abduction  _96  Lateral trunk Supports _97  Lt     _98  Rt _99 decrease lateral trunk leaning _100 control tone _101 contour for increased contact _102 safety  _103 accommodate asymmetry _104  ?????  _105  Mounting hardware  _106 lateral trunk supports  _107 back   _108 seat _109 headrest      _110  thigh support _111 fixed   _112 swing away _113 attach seat platform/cushion to w/c frame _114 attach back cushion to w/c frame _115 mount postural supports _116 mount headrest  _117 swing medial thigh support away _118 swing lateral supports away for transfers  _119  ?????    Armrests  _120 fixed _121 adjustable height _122 removable   _123 swing away  _124 flip back   _125 reclining _126 full length pads _127 desk    _128 pads tubular  _129 provide support with elbow at 90   _130 provide support for w/c tray _131 change of height/angles for variable activities _132 remove for transfers _133 allow to come closer to table top _134 remove for access to tables _135  ?????  Hangers/ Leg rests  _136 60 _137 70 _138 90 _139 elevating _140 heavy duty  _141 articulating _142 fixed _143 lift off _144 swing away     _145 power _146 provide LE support  _147 accommodate to hamstring tightness _148 elevate legs during recline   _149 provide change in position for Legs _150 Maintain placement of feet on footplate _151 durability _152 enable transfers _153 decrease edema _154 Accommodate  lower leg length _155  ?????  Foot support Footplate    <ZOXWRUEAVWUJWJXB>_1<\/YNWGNFAOZHYQMVHQ>_469   Lt  _0  Rt  _1  Center mount _2 flip up     _3 depth/angle adjustable _4 Amputee adapter    _5  Lt     _6  Rt _7 provide foot support _8 accommodate to ankle ROM _9 transfers _10 Provide support for residual extremity _11  allow foot to go under wheelchair base _12  decrease tone  _13  ?????  _14  Ankle strap/heel loops _15 support foot on foot support _16 decrease extraneous movement _17 provide input to heel  _18 protect foot  Tires: _19 pneumatic  _20 flat free inserts  _21 solid  _22 decrease maintenance  _23 prevent frequent flats _24 increase shock absorbency _25 decrease pain from road shock _26 decrease spasms from road shock _27  ?????  _28  Headrest  _29 provide posterior head support _30 provide posterior neck support _31 provide lateral head support _32 provide anterior head support _33 support during tilt and recline _34 improve feeding   _35 improve respiration _36 placement of switches _37 safety  _38 accommodate ROM  _39 accommodate tone _40 improve visual orientation  _41  Anterior chest strap _42  Vest _43  Shoulder retractors  _44 decrease forward movement of shoulder _45 accommodation of TLSO _46 decrease forward movement of trunk _47 decrease shoulder elevation _48 added abdominal support _49 alignment _50 assistance with shoulder control  _51  ?????  Pelvic Positioner _52 Belt _53 SubASIS bar _54 Dual Pull _55 stabilize tone _56 decrease falling out of chair/ **will not Decr potential for sliding due to pelvic tilting _57 prevent excessive rotation _58 pad for protection over boney prominence _59 prominence comfort _60 special pull angle to control rotation _61  ?????  Upper Extremity Support _62 L   _63  R _64 Arm trough    _65 hand support _66  tray       _67 full tray _68 swivel mount _69 decrease edema      _70 decrease subluxation   _71 control tone   _72 placement for AAC/Computer/EADL _73 decrease gravitational pull on shoulders _74 provide midline positioning _75 provide support to increase UE  function _76 provide hand support in natural position _77 provide work surface   POWER WHEELCHAIR CONTROLS  _78 Proportional  _79 Non-Proportional Type Joystick _80 Left  _81 Right _82 provides access for controlling wheelchair   _83 lacks motor control to operate proportional drive control <IFOYDXAJOINOMVEH>_2<\/CNOBSJGGEZMOQHUT>_65 unable to understand proportional controls  Actuator Control Module  _85 Single  _86 Multiple   _87 Allow the client to operate the power seat function(s) through the joystick control   _88 Safety Reset Switches _89 Used to change modes and stop the wheelchair when driving in latch mode    _90 [employer]   _91 programming for accurate control _92 progressive Disease/changing condition _93 non-proportional drive control needed _94 Needed in order to operate power seat functions through joystick control   _95 Display box _96 Allows user to see in which mode and drive the wheelchair is set  _97 necessary for alternate controls    _98 Digital interface electronics _99 Allows w/c to operate when using alternative drive controls  <YYTKPTWSFKCLEXNT>_7<\/GYFVCBSWHQPRFFMB>_846 ASL Head Array _101 Allows client to operate wheelchair  through switches placed in tri-panel headrest  _102 Sip and puff with tubing kit _103 needed to operate sip and puff drive controls  <KZLDJTTSVXBLTJQZ>_0<\/SPQZRAQTMAUQJFHL>_456 Upgraded tracking electronics _105 increase safety when driving <YBWLSLHTDSKAJGOT>_1<\/XBWIOMBTDHRCBULA>_453 correct tracking when on uneven surfaces  _107 [facility] for switches or joystick _108 Attaches switches to w/c  _109 Swing away for access or transfers _110 midline for optimal placement _111 provides for consistent access  _112 Attendant controlled joystick plus mount _113 safety _114 long distance driving <MIWOEHOZYYQMGNOI>_3<\/BCWUGQBVQXIHWTUU>_828 operation of seat functions _116 compliance with transportation regulations _117  ?????    Rear wheel placement/Axle adjustability _118 None _119 semi adjustable _120 fully adjustable  _121 improved UE access to wheels _122 improved stability _123 changing angle in space for improvement of postural stability _124 1-arm drive access <MKLKJZPHXTAVWPVX>_4<\/IAXKPVVZSMOLMBEM>_754 amputee pad placement _126  ?????  Wheel rims/ hand rims  _127 metal   _128 plastic coated _129 oblique projections _130 vertical projections _131 Provide ability to propel manual wheelchair  _132  Increase self-propulsion with hand weakness/decreased grasp  Push handles _0 extended  _1 angle adjustable  _2 standard _3 caregiver access _4 caregiver assist _5 allows "hooking" to enable increased ability to perform ADLs or maintain balance  One armed device  _6 Lt   _7 Rt _8 enable propulsion of manual wheelchair with one arm   _9  ?????   Brake/wheel lock extension _10  Lt   _11  Rt _12 increase indep in applying wheel locks   _13 Side guards _14 prevent clothing getting caught in wheel or becoming soiled _15  prevent skin tears/abrasions  Battery: NF 22's x 2 _16 to power wheelchair ?????  Other: ????? ????? ?????  The above equipment has a life- long use expectancy. Growth and changes in medical and/or functional conditions would be the exceptions. This is to certify that the therapist has no financial relationship with durable medical provider or manufacturer. The therapist will not receive remuneration of any kind for the equipment recommended in this evaluation.   Patient has mobility limitation that significantly impairs safe, timely participation in one or more mobility related ADL's.  (bathing, toileting, feeding, dressing, grooming, moving from room to room)                                                             _17  Yes _18  No Will mobility device sufficiently improve ability to participate and/or be aided in participation of MRADL's?         _19  Yes _20  No Can limitation be compensated for with use of a cane or walker?                                                                                _21  Yes _22  No Does patient or caregiver demonstrate ability/potential ability & willingness to safely use the mobility device?   _23  Yes _24  No Does patient's home environment support use of recommended mobility device?                                                    _25  Yes _26  No Does patient have  sufficient upper extremity function necessary to functionally propel a manual wheelchair?    _27  Yes _28  No Does patient have sufficient strength and trunk stability to safely operate a POV (scooter)?                                  _29  Yes _30  No Does patient need additional features/benefits provided by a power wheelchair for MRADL's in the home?       _31  Yes _32  No Does the patient demonstrate the ability to safely use a power wheelchair?                                                              [  x] Yes _0  No  Therapist Name Printed: Guido Sander, PT Date: 08-23-16  Therapist's Signature:   Date:   Supplier's Name Printed: [employer], [employer] Date: 08-23-16  Supplier's Signature:   Date:  Patient/Caregiver Signature:   Date:     This is to certify that I have read this evaluation and do agree with the content within:      Physician's Name Printed: Dennie Bible, NP  94 Signature:  Date:     This is to certify that I, the above signed therapist have the following affiliations: _1  This DME provider _2  Manufacturer of recommended equipment _3  Patient's long term care facility _4  None of the above                                     Plan - 08/24/16 1919    Clinical Impression Statement Pt is a 56 yr old male with MS:  recommend power wheelchair with power tilt so that pt can be independent in performing pressure relief.  Pt is nonambulatory at this time and presents with Lt side more impaired than Rt side.  Power wheelchair eval completed with [employer], [employer] from [facility].     PT Frequency One time visit   PT Treatment/Interventions Other (comment)  wheelchair management   Recommended Other Services power wheelchair with power tilt to be obtained from [facility] and Agree with Plan of Care Patient      Patient will benefit from skilled therapeutic intervention in order to improve the following deficits and impairments:  Decreased  mobility, Impaired flexibility, Increased muscle spasms, Impaired tone, Impaired UE functional use, Decreased balance  Visit Diagnosis: Other abnormalities of gait and mobility - Plan: PT plan of care cert/re-cert  Muscle weakness (generalized) - Plan: PT plan of care cert/re-cert  Other symptoms and signs involving the nervous system - Plan: PT plan of care cert/re-cert     Problem List Patient Active Problem List   Diagnosis Date Noted  . 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 Abnormality of gait 04/22/2013  . HTN (hypertension) 06/29/2012  . Multiple sclerosis ([place]) 06/29/2012  . Benign prostatic hyperplasia with urinary obstruction 06/29/2012    BZMCEY, EMVVK PQAESLP, PT 08/24/2016, 7:31 PM  [facility] [address] [place], [place], [address] Phone: [phone]   Fax:  [phone]  Name: Randy Shaw MRN: [id number] Date of Birth: 1960-10-01

## 2016-09-05 ENCOUNTER — Ambulatory Visit: Payer: BLUE CROSS/BLUE SHIELD | Admitting: Adult Health

## 2016-09-14 ENCOUNTER — Encounter (INDEPENDENT_AMBULATORY_CARE_PROVIDER_SITE_OTHER): Payer: Self-pay

## 2016-09-14 ENCOUNTER — Encounter: Payer: Self-pay | Admitting: Adult Health

## 2016-09-14 ENCOUNTER — Ambulatory Visit (INDEPENDENT_AMBULATORY_CARE_PROVIDER_SITE_OTHER): Payer: BLUE CROSS/BLUE SHIELD | Admitting: Adult Health

## 2016-09-14 ENCOUNTER — Other Ambulatory Visit: Payer: Self-pay | Admitting: Neurology

## 2016-09-14 VITALS — BP 130/88 | HR 97 | Ht 75.0 in

## 2016-09-14 DIAGNOSIS — Z5181 Encounter for therapeutic drug level monitoring: Secondary | ICD-10-CM

## 2016-09-14 DIAGNOSIS — G35 Multiple sclerosis: Secondary | ICD-10-CM | POA: Diagnosis not present

## 2016-09-14 DIAGNOSIS — R269 Unspecified abnormalities of gait and mobility: Secondary | ICD-10-CM | POA: Diagnosis not present

## 2016-09-14 DIAGNOSIS — G8250 Quadriplegia, unspecified: Secondary | ICD-10-CM

## 2016-09-14 NOTE — Progress Notes (Signed)
I have read the note, and I agree with the clinical assessment and plan.  Ahmir Bracken,Maor KEITH   

## 2016-09-14 NOTE — Progress Notes (Signed)
PATIENT: Randy Shaw DOB: [birth year]  REASON FOR VISIT: follow up- multiple sclerosis HISTORY FROM: patient  HISTORY OF PRESENT ILLNESS: Randy Shaw is a 56 year old male with a history of multiple sclerosis. He returns today for follow-up. He is currently taking Tecfidera and tolerating it well. He was here in March for evaluation of a motorized wheelchair. He states that he is in the process of obtaining this. He denies any new neurological symptoms. Denies any changes with his gait or balance. No new numbness or weakness. No changes with his vision. No change with the bowels or bladder. Overall he feels that things have remained stable. He does report some low back pain and is worried this may be related to his kidneys. He returns today for an evaluation.  HISTORY 07/04/16: Copied From Deon Pilling notes: Randy Shaw, 56 year old male returns for follow-up for history of multiple sclerosis and evaluation of a motorized wheelchair. Patient is nonambulatory. He has a spastic quadriparesis. His symptoms are worse on the left side of the body than the right. He does not ambulate. He has a neurogenic bladder. The patient continues to live alone with family members checking in on him. He reports some double vision that is a chronic problem for him. The patient falls on occasion and is unsafe with ambulation. These falls could lead to other injuries. He is willing and mentally capable of operating a power chair. He remains on tecfidera for his multiple sclerosis. Due to his multiple sclerosis likely that his condition will continue to deteriorate over time and his functional level will continue to decline. Recent labs reviewed he returns for reevaluation  REVIEW OF SYSTEMS: Out of a complete 14 system review of symptoms, the patient complains only of the following symptoms, and all other reviewed systems are negative.  Speech difficulty, tremors, decreased concentration, joint pain, urgency,  insomnia, frequent waking, itching, double vision, heat intolerance  ALLERGIES: Allergies  Allergen Reactions  . Peanut-Containing Drug Products Shortness Of Breath  . Zanaflex [Tizanidine Hcl] Shortness Of Breath    HOME MEDICATIONS: Outpatient Medications Prior to Visit  Medication Sig Dispense Refill  . baclofen (LIORESAL) 20 MG tablet One tab in the morning, one tab at noon, 2 tabs at night 120 tablet 11  . hydrochlorothiazide (MICROZIDE) 12.5 MG capsule TAKE 1 CAPSULE(12.5 MG) BY MOUTH DAILY 90 capsule 1  . mirabegron ER (MYRBETRIQ) 50 MG TB24 tablet Take 50 mg by mouth daily.    . mirtazapine (REMERON) 15 MG tablet TK 1 T PO  QHS  1  . tamsulosin (FLOMAX) 0.4 MG CAPS capsule Take 1 capsule (0.4 mg total) by mouth daily. 30 capsule 0  . TECFIDERA 240 MG CPDR TAKE 1 CAPSULE BY MOUTH TWICE DAILY 60 capsule 5   No facility-administered medications prior to visit.     PAST MEDICAL HISTORY: Past Medical History:  Diagnosis Date  . Abnormality of gait 04/22/2013  . Asthma   . BPH (benign prostatic hyperplasia)   . Hypertension   . Multiple sclerosis ([facility])   . 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    PAST SURGICAL HISTORY: Past Surgical History:  Procedure Laterality Date  . none      FAMILY HISTORY: Family History  Problem Relation Age of Onset  . Cancer Mother        breast  . Stroke Father   . Cancer - Colon Father   . Multiple sclerosis Neg Hx     SOCIAL HISTORY: Social History   Social  History  . Marital status: Divorced    Spouse name: n/a  . Number of children: 3  . Years of education: 12   Occupational History  . disabled/retired 1999-[employer]     [employer]   Social History Main Topics  . Smoking status: Never Smoker  . Smokeless tobacco: Never Used  . Alcohol use 0.0 oz/week     Comment: occasionally wine cooler  . Drug use: No  . Sexual activity: No   Other Topics Concern  . Not on file   Social History Narrative    Lives alone, house.  3 children.  Caffeine none.  Green Tea once week.        PHYSICAL EXAM  Vitals:   09/14/16 1422  BP: 130/88  Pulse: 97  Height: 6\' 3"  (1.905 m)   There is no height or weight on file to calculate BMI.  Generalized: Well developed, in no acute distress   Neurological examination  Mentation: Alert oriented to time, place, history taking. Follows all commands speech and language fluent Cranial nerve II-XII: Pupils were equal round reactive to light. Extraocular movements were full with exception of left INO. Facial sensation and strength were normal. Uvula tongue midline. Head turning and shoulder shrug  were normal and symmetric. Motor: The motor testing reveals 5 over 5 strength in the upper extremities. Increased motor tone on the left lower extremities. Decreased strength in the LLE.  Sensory: Sensory testing is intact to soft touch on all 4 extremities. No evidence of extinction is noted.  Coordination: Cerebellar testing reveals good finger-nose-finger on the right. Dysmetria on the left. Unable to complete heel to shin with the left leg. Good heel to shin on the right. Gait and station: Patient is nonambulatory. He is in a motorized wheelchair.   DIAGNOSTIC DATA (LABS, IMAGING, TESTING) - I reviewed patient records, labs, notes, testing and imaging myself where available.  Lab Results  Component Value Date   WBC 5.7 03/08/2016   HGB 13.7 05/08/2014   HCT 41.4 03/08/2016   MCV 81 03/08/2016   PLT 228 03/08/2016      Component Value Date/Time   NA 146 (H) 03/08/2016 1008   K 4.3 03/08/2016 1008   CL 103 03/08/2016 1008   CO2 27 03/08/2016 1008   GLUCOSE 88 03/08/2016 1008   GLUCOSE 96 07/30/2013 0952   BUN 13 03/08/2016 1008   CREATININE 0.87 03/08/2016 1008   CREATININE 0.89 07/30/2013 0952   CALCIUM 9.6 03/08/2016 1008   PROT 7.1 03/08/2016 1008   ALBUMIN 4.1 03/08/2016 1008   AST 24 03/08/2016 1008   ALT 37 03/08/2016 1008   ALKPHOS 79  03/08/2016 1008   BILITOT 0.3 03/08/2016 1008   GFRNONAA 96 03/08/2016 1008   GFRNONAA >89 07/30/2013 0952   GFRAA 112 03/08/2016 1008   GFRAA >89 07/30/2013 0952      ASSESSMENT AND PLAN 56 y.o. year old male  has a past medical history of Abnormality of gait (04/22/2013); Asthma; BPH (benign prostatic hyperplasia); Hypertension; Multiple sclerosis ([facility]); and Quadriplegia and quadriparesis ([facility]) (03/08/2016). here with :  1. Multiple sclerosis 2. Gait disturbance 3. Spastic quadriparesis  Overall the patient has remained stable. He will continue on tecfidera. I will check blood work today. Patient is advised that if his symptoms worsen or he develops new symptoms he should let us know. He will follow-up in 6 months or sooner if needed.    Butch Penny, MSN, NP-C 09/14/2016, 3:00 PM [facility]  [address], [place], [place] [address] [phone]

## 2016-09-14 NOTE — Patient Instructions (Signed)
Continue Tecfidera Blood work today If your symptoms worsen or you develop new symptoms please let us know.   

## 2016-09-15 LAB — COMPREHENSIVE METABOLIC PANEL
A/G RATIO: 1.5 (ref 1.2–2.2)
ALK PHOS: 79 IU/L (ref 39–117)
ALT: 20 IU/L (ref 0–44)
AST: 20 IU/L (ref 0–40)
Albumin: 4.6 g/dL (ref 3.5–5.5)
BILIRUBIN TOTAL: 0.4 mg/dL (ref 0.0–1.2)
BUN/Creatinine Ratio: 16 (ref 9–20)
BUN: 16 mg/dL (ref 6–24)
CHLORIDE: 101 mmol/L (ref 96–106)
CO2: 28 mmol/L (ref 18–29)
Calcium: 10.2 mg/dL (ref 8.7–10.2)
Creatinine, Ser: 1.01 mg/dL (ref 0.76–1.27)
GFR calc non Af Amer: 83 mL/min/{1.73_m2} (ref >59)
GFR, EST AFRICAN AMERICAN: 96 mL/min/{1.73_m2} (ref >59)
Globulin, Total: 3.1 g/dL (ref 1.5–4.5)
Glucose: 110 mg/dL — ABNORMAL HIGH (ref 65–99)
Potassium: 3.7 mmol/L (ref 3.5–5.2)
Sodium: 143 mmol/L (ref 134–144)
Total Protein: 7.7 g/dL (ref 6.0–8.5)

## 2016-09-15 LAB — CBC WITH DIFFERENTIAL/PLATELET
Basophils Absolute: 0 10*3/uL (ref 0.0–0.2)
Basos: 1 %
EOS (ABSOLUTE): 0.2 10*3/uL (ref 0.0–0.4)
Eos: 3 %
HEMOGLOBIN: 14.6 g/dL (ref 13.0–17.7)
Hematocrit: 43.6 % (ref 37.5–51.0)
IMMATURE GRANS (ABS): 0 10*3/uL (ref 0.0–0.1)
Immature Granulocytes: 0 %
LYMPHS: 30 %
Lymphocytes Absolute: 1.6 10*3/uL (ref 0.7–3.1)
MCH: 27.4 pg (ref 26.6–33.0)
MCHC: 33.5 g/dL (ref 31.5–35.7)
MCV: 82 fL (ref 79–97)
Monocytes Absolute: 0.4 10*3/uL (ref 0.1–0.9)
Monocytes: 7 %
NEUTROS ABS: 3.2 10*3/uL (ref 1.4–7.0)
NEUTROS PCT: 59 %
Platelets: 235 10*3/uL (ref 150–379)
RBC: 5.33 x10E6/uL (ref 4.14–5.80)
RDW: 14.2 % (ref 12.3–15.4)
WBC: 5.4 10*3/uL (ref 3.4–10.8)

## 2016-09-19 ENCOUNTER — Telehealth: Payer: Self-pay | Admitting: *Deleted

## 2016-09-19 NOTE — Telephone Encounter (Signed)
LMVM home that lab results unremarkable,  (all normal except glucose which was nonfasting glucose level).   He is to call back if questions.

## 2016-09-19 NOTE — Telephone Encounter (Signed)
-----   Message from Butch Penny, NP sent at 09/15/2016 10:19 AM EDT ----- Lab work unremarkable. Please call patient

## 2016-09-21 NOTE — Telephone Encounter (Signed)
Received from [facility]. From [facility] regarding the power wheelchair p/w to review and sign.

## 2016-10-03 ENCOUNTER — Other Ambulatory Visit: Payer: Self-pay | Admitting: Neurology

## 2016-10-27 DIAGNOSIS — G35 Multiple sclerosis: Secondary | ICD-10-CM | POA: Diagnosis not present

## 2017-01-14 ENCOUNTER — Other Ambulatory Visit: Payer: Self-pay | Admitting: Neurology

## 2017-02-10 ENCOUNTER — Other Ambulatory Visit: Payer: Self-pay | Admitting: Family Medicine

## 2017-02-10 DIAGNOSIS — I10 Essential (primary) hypertension: Secondary | ICD-10-CM

## 2017-02-12 ENCOUNTER — Telehealth: Payer: Self-pay | Admitting: Family Medicine

## 2017-02-12 NOTE — Telephone Encounter (Signed)
[facility] - Pt needs a refill on his medication and wants to discuss a referral for a sleep study.  [phone]

## 2017-02-19 NOTE — Telephone Encounter (Signed)
Called pt to let us know what medication he would like refilled. Please see for referral on sleep study.

## 2017-02-20 ENCOUNTER — Ambulatory Visit (INDEPENDENT_AMBULATORY_CARE_PROVIDER_SITE_OTHER): Payer: BLUE CROSS/BLUE SHIELD | Admitting: Family Medicine

## 2017-02-20 ENCOUNTER — Encounter: Payer: Self-pay | Admitting: Family Medicine

## 2017-02-20 VITALS — BP 132/82 | HR 82 | Temp 98.0°F | Resp 16 | Ht 75.0 in

## 2017-02-20 DIAGNOSIS — I10 Essential (primary) hypertension: Secondary | ICD-10-CM

## 2017-02-20 DIAGNOSIS — R0683 Snoring: Secondary | ICD-10-CM | POA: Diagnosis not present

## 2017-02-20 DIAGNOSIS — L609 Nail disorder, unspecified: Secondary | ICD-10-CM | POA: Diagnosis not present

## 2017-02-20 DIAGNOSIS — G8250 Quadriplegia, unspecified: Secondary | ICD-10-CM

## 2017-02-20 DIAGNOSIS — B351 Tinea unguium: Secondary | ICD-10-CM | POA: Diagnosis not present

## 2017-02-20 DIAGNOSIS — R400 Somnolence: Secondary | ICD-10-CM | POA: Diagnosis not present

## 2017-02-20 DIAGNOSIS — R739 Hyperglycemia, unspecified: Secondary | ICD-10-CM | POA: Diagnosis not present

## 2017-02-20 DIAGNOSIS — G35 Multiple sclerosis: Secondary | ICD-10-CM | POA: Diagnosis not present

## 2017-02-20 DIAGNOSIS — S91209A Unspecified open wound of unspecified toe(s) with damage to nail, initial encounter: Secondary | ICD-10-CM

## 2017-02-20 DIAGNOSIS — G4700 Insomnia, unspecified: Secondary | ICD-10-CM | POA: Diagnosis not present

## 2017-02-20 MED ORDER — HYDROCHLOROTHIAZIDE 12.5 MG PO CAPS: ORAL_CAPSULE | ORAL | 2 refills | Status: DC

## 2017-02-20 NOTE — Patient Instructions (Addendum)
See information on insomnia, but I will refer you to sleep specialist. Please follow-up if you are having persistent difficulty with sleep after that visit.  You appear to have some dry skin on the back.  Try Eucerin lotion or Aveeno over-the-counter and if that is not improving in the next week, please return for recheck. Return sooner if worse.  No change in blood pressure medications for now.  I'll refer you to podiatry for nail care, I expect the new toenail should grow back in without difficulty, but if you have persistent discolored or thickened toenails, please return to discuss other treatments for possible fungal infection.  Please follow-up with me in 6 months for blood pressure assessment, as well as physical to look at other screening tests if needed.  I will check your blood sugar on lab work today.   Return to the clinic or go to the nearest emergency room if any of your symptoms worsen or new symptoms occur.  Insomnia Insomnia is a sleep disorder that makes it difficult to fall asleep or to stay asleep. Insomnia can cause tiredness (fatigue), low energy, difficulty concentrating, mood swings, and poor performance at work or school. There are three different ways to classify insomnia:  Difficulty falling asleep.  Difficulty staying asleep.  Waking up too early in the morning.  Any type of insomnia can be long-term (chronic) or short-term (acute). Both are common. Short-term insomnia usually lasts for three months or less. Chronic insomnia occurs at least three times a week for longer than three months. What are the causes? Insomnia may be caused by another condition, situation, or substance, such as:  Anxiety.  Certain medicines.  Gastroesophageal reflux disease (GERD) or other gastrointestinal conditions.  Asthma or other breathing conditions.  Restless legs syndrome, sleep apnea, or other sleep disorders.  Chronic pain.  Menopause. This may include hot  flashes.  Stroke.  Abuse of alcohol, tobacco, or illegal drugs.  Depression.  Caffeine.  Neurological disorders, such as Alzheimer disease.  An overactive thyroid (hyperthyroidism).  The cause of insomnia may not be known. What increases the risk? Risk factors for insomnia include:  Gender. Women are more commonly affected than men.  Age. Insomnia is more common as you get older.  Stress. This may involve your professional or personal life.  Income. Insomnia is more common in people with lower income.  Lack of exercise.  Irregular work schedule or night shifts.  Traveling between different time zones.  What are the signs or symptoms? If you have insomnia, trouble falling asleep or trouble staying asleep is the main symptom. This may lead to other symptoms, such as:  Feeling fatigued.  Feeling nervous about going to sleep.  Not feeling rested in the morning.  Having trouble concentrating.  Feeling irritable, anxious, or depressed.  How is this treated? Treatment for insomnia depends on the cause. If your insomnia is caused by an underlying condition, treatment will focus on addressing the condition. Treatment may also include:  Medicines to help you sleep.  Counseling or therapy.  Lifestyle adjustments.  Follow these instructions at home:  Take medicines only as directed by your health care provider.  Keep regular sleeping and waking hours. Avoid naps.  Keep a sleep diary to help you and your health care provider figure out what could be causing your insomnia. Include: ? When you sleep. ? When you wake up during the night. ? How well you sleep. ? How rested you feel the next day. ? Any  side effects of medicines you are taking. ? What you eat and drink.  Make your bedroom a comfortable place where it is easy to fall asleep: ? Put up shades or special blackout curtains to block light from outside. ? Use a white noise machine to block noise. ? Keep  the temperature cool.  Exercise regularly as directed by your health care provider. Avoid exercising right before bedtime.  Use relaxation techniques to manage stress. Ask your health care provider to suggest some techniques that may work well for you. These may include: ? Breathing exercises. ? Routines to release muscle tension. ? Visualizing peaceful scenes.  Cut back on alcohol, caffeinated beverages, and cigarettes, especially close to bedtime. These can disrupt your sleep.  Do not overeat or eat spicy foods right before bedtime. This can lead to digestive discomfort that can make it hard for you to sleep.  Limit screen use before bedtime. This includes: ? Watching TV. ? Using your smartphone, tablet, and computer.  Stick to a routine. This can help you fall asleep faster. Try to do a quiet activity, brush your teeth, and go to bed at the same time each night.  Get out of bed if you are still awake after 15 minutes of trying to sleep. Keep the lights down, but try reading or doing a quiet activity. When you feel sleepy, go back to bed.  Make sure that you drive carefully. Avoid driving if you feel very sleepy.  Keep all follow-up appointments as directed by your health care provider. This is important. Contact a health care provider if:  You are tired throughout the day or have trouble in your daily routine due to sleepiness.  You continue to have sleep problems or your sleep problems get worse. Get help right away if:  You have serious thoughts about hurting yourself or someone else. This information is not intended to replace advice given to you by your health care provider. Make sure you discuss any questions you have with your health care provider. Document Released: 04/14/2000 Document Revised: 09/17/2015 Document Reviewed: 01/16/2014 Elsevier Interactive Patient Education  2018 [employer]     IF you received an x-ray today, you will receive an invoice from  [facility]. Please contact [facility] at [phone] with questions or concerns regarding your invoice.   IF you received labwork today, you will receive an invoice from [facility]. Please contact [facility] at [phone] with questions or concerns regarding your invoice.   Our billing staff will not be able to assist you with questions regarding bills from these companies.  You will be contacted with the lab results as soon as they are available. The fastest way to get your results is to activate your My Chart account. Instructions are located on the last page of this paperwork. If you have not heard from us regarding the results in 2 weeks, please contact this office.

## 2017-02-20 NOTE — Telephone Encounter (Signed)
Pt has OV scheduled for today ?

## 2017-02-20 NOTE — Progress Notes (Signed)
Subjective:    Patient ID: Randy BarthelCharles M Pizzi, male    DOB: [birth year], 56 y.o.   MRN: [id number]  Chief Complaint  Patient presents with  . Medication Refill    all medications prescribed by Dr. Neva SeatGreene  . Nail Problem    Pt states he would like to get his toes nail looked at on the right foot ( big toe)   . Sleeping Problem    need a referral to sleep study.     HPI  Randy Shaw is a 56 y.o. male who presents [facility] at [facility] complaining of sleeping problem, nail problem, and HCTZ refill.  Hypertension: Takes HCTZ 12.5 mg QD. He reports missing doses the last two days. Reports no side effects with taking HCTZ. His BP was elevated last visit at 168/88.  Lab Results  Component Value Date   CREATININE 1.01 09/14/2016   Multiple Sclerosis: He is followed by neurology with [facility]. Office visit in May. He was continued on tecfidera. He also has spastic quadraparesis and neurogenic bladder. Takes myrbetriq 50 mg QD. He is on baclofen 4 times per day, 2 at bedtime. He reports not being on remeron 15 mg anymore. He reports occasional light-headedness.  Sleep Difficulty: See telephone message. He reports having trouble staying asleep. He explains that goes to bed late and sleeps for a couple hours then wakes up. He does snore. He would like a referral to sleep study.  Nail Problem: He reports hurting his toe while on his scooter. He does not recall when he hurt it. He noticed it falling off while he was bathing. He denies any pus. He reports having normal sensation in the toe.   Patient Active Problem List   Diagnosis Date Noted  . 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  . Abnormality of gait 04/22/2013  . HTN (hypertension) 06/29/2012  . Multiple sclerosis (HCC) 06/29/2012  . Benign prostatic hyperplasia with urinary obstruction 06/29/2012   Past Medical History:  Diagnosis Date  . Abnormality of gait 04/22/2013  . Asthma   . BPH (benign prostatic  hyperplasia)   . Hypertension   . Multiple sclerosis (HCC)   . 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   Past Surgical History:  Procedure Laterality Date  . none     Allergies  Allergen Reactions  . Peanut-Containing Drug Products Shortness Of Breath  . Zanaflex [Tizanidine Hcl] Shortness Of Breath   Prior to Admission medications   Medication Sig Start Date End Date Taking? Authorizing Provider  baclofen (LIORESAL) 20 MG tablet TAKE 1 TABLET IN THE MORNING, 1 AT NOON, AND 2 AT NIGHT 10/03/16  Yes Levert FeinsteinYan, Yijun, MD  hydrochlorothiazide (MICROZIDE) 12.5 MG capsule TAKE 1 CAPSULE(12.5 MG) BY MOUTH DAILY 08/11/16  Yes Shade FloodGreene, Tracker Mance R, MD  mirabegron ER (MYRBETRIQ) 50 MG TB24 tablet Take 50 mg by mouth daily.   Yes [provider]  mirtazapine (REMERON) 15 MG tablet TK 1 T PO  QHS 02/22/16  Yes [provider]  tamsulosin (FLOMAX) 0.4 MG CAPS capsule Take 1 capsule (0.4 mg total) by mouth daily. 07/01/14  Yes Tonye Pearsonoolittle, Robert P, MD  TECFIDERA 240 MG CPDR TAKE 1 CAPSULE BY MOUTH TWICE DAILY 01/15/17  Yes York SpanielWillis, Audra K, MD   Social History   Social History  . Marital status: Divorced    Spouse name: n/a  . Number of children: 3  . Years of education: 12   Occupational History  . disabled/retired 1999-[employer]  [employer] Emergency planning/management officer   Social History Main Topics  . Smoking status: Never Smoker  . Smokeless tobacco: Never Used  . Alcohol use 0.0 oz/week     Comment: occasionally wine cooler  . Drug use: No  . Sexual activity: No   Other Topics Concern  . Not on file   Social History Narrative   Lives alone, house.  3 children.  Caffeine none.  Green Tea once week.      Review of Systems  Neurological: Positive for light-headedness.  All other systems reviewed and are negative.       Objective:   Physical Exam  Constitutional: He is oriented to person, place, and time. He appears well-developed and well-nourished.  HENT:  Head: Normocephalic  and atraumatic.  Eyes: Pupils are equal, round, and reactive to light. EOM are normal.  Neck: No JVD present. Carotid bruit is not present.  Cardiovascular: Normal rate, regular rhythm and normal heart sounds.   No murmur heard. Pulmonary/Chest: Effort normal and breath sounds normal. He has no rales.  Musculoskeletal: He exhibits no edema.  Neurological: He is alert and oriented to person, place, and time.  Skin: Skin is warm and dry.  Psychiatric: He has a normal mood and affect.  Vitals reviewed. Feet: Thickened, discolored great toenail lifted distally. Small healing abrasion on the dorsal great toe. Nail bed visualized below with loose nail barely attached at the base lateral quarter of the proximal nail fold. Overgrown toe nails on the third and fourth toes of right foot.  Vitals:   02/20/17 1322  BP: 132/82  Pulse: 82  Resp: 16  Temp: 98 F (36.7 C)  TempSrc: Oral  SpO2: 97%  Height: 6\' 3"  (1.905 m)    Results for orders placed or performed in visit on 09/14/16  Comprehensive metabolic panel  Result Value Ref Range   Glucose 110 (H) 65 - 99 mg/dL   BUN 16 6 - 24 mg/dL   Creatinine, Ser 4.09 0.76 - 1.27 mg/dL   GFR calc non Af Amer 83 >59 mL/min/1.73   GFR calc Af Amer 96 >59 mL/min/1.73   BUN/Creatinine Ratio 16 9 - 20   Sodium 143 134 - 144 mmol/L   Potassium 3.7 3.5 - 5.2 mmol/L   Chloride 101 96 - 106 mmol/L   CO2 28 18 - 29 mmol/L   Calcium 10.2 8.7 - 10.2 mg/dL   Total Protein 7.7 6.0 - 8.5 g/dL   Albumin 4.6 3.5 - 5.5 g/dL   Globulin, Total 3.1 1.5 - 4.5 g/dL   Albumin/Globulin Ratio 1.5 1.2 - 2.2   Bilirubin Total 0.4 0.0 - 1.2 mg/dL   Alkaline Phosphatase 79 39 - 117 IU/L   AST 20 0 - 40 IU/L   ALT 20 0 - 44 IU/L  CBC with Differential/Platelet  Result Value Ref Range   WBC 5.4 3.4 - 10.8 x10E3/uL   RBC 5.33 4.14 - 5.80 x10E6/uL   Hemoglobin 14.6 13.0 - 17.7 g/dL   Hematocrit 81.1 91.4 - 51.0 %   MCV 82 79 - 97 fL   MCH 27.4 26.6 - 33.0 pg   MCHC  33.5 31.5 - 35.7 g/dL   RDW 78.2 95.6 - 21.3 %   Platelets 235 150 - 379 x10E3/uL   Neutrophils 59 Not Estab. %   Lymphs 30 Not Estab. %   Monocytes 7 Not Estab. %   Eos 3 Not Estab. %   Basos 1 Not Estab. %   Neutrophils Absolute  3.2 1.4 - 7.0 x10E3/uL   Lymphocytes Absolute 1.6 0.7 - 3.1 x10E3/uL   Monocytes Absolute 0.4 0.1 - 0.9 x10E3/uL   EOS (ABSOLUTE) 0.2 0.0 - 0.4 x10E3/uL   Basophils Absolute 0.0 0.0 - 0.2 x10E3/uL   Immature Granulocytes 0 Not Estab. %   Immature Grans (Abs) 0.0 0.0 - 0.1 x10E3/uL          Assessment & Plan:   CONSTANTINOS KREMPASKY is a 56 y.o. male Snoring - Plan: Ambulatory referral to Sleep Studies Daytime somnolence - Plan: Ambulatory referral to Sleep Studies Insomnia, unspecified type - Plan: Ambulatory referral to Sleep Studies  - Nighttime wakening, no difficulty with sleep onset. Does have snoring, some daytime somnolence, potential for sleep apnea. Refer to sleep studies for further evaluation, then follow-up if thought to be psychological cause to discuss potential treatments  Essential hypertension - Plan: hydrochlorothiazide (MICROZIDE) 12.5 MG capsule, Comprehensive metabolic panel  - Stable. Labs pending, no change in meds  Quadriplegia and quadriparesis (HCC) Multiple sclerosis (HCC)  -Followed by neurology. Continue ongoing specialist care  Onychomycosis - Plan: Ambulatory referral to Podiatry Avulsion of toenail, initial encounter Nail abnormalities - Plan: Ambulatory referral to Podiatry  -Avulsed likely onychomycotic nail was removed without difficulty. New nail seems to be forming below. Potential for onychomycosis in that nail also discussed.  -Other overgrown nails with difficulty with foot care with conditions above. Will refer to podiatry for evaluation and nail care  Hyperglycemia - Plan: Comprehensive metabolic panel, Hemoglobin A1c  - Check CMP, A1c, but may have been nonfasting.  Meds ordered this encounter  Medications    . hydrochlorothiazide (MICROZIDE) 12.5 MG capsule    Sig: TAKE 1 CAPSULE(12.5 MG) BY MOUTH DAILY    Dispense:  90 capsule    Refill:  2   Patient Instructions   See information on insomnia, but I will refer you to sleep specialist. Please follow-up if you are having persistent difficulty with sleep after that visit.  You appear to have some dry skin on the back.  Try Eucerin lotion or Aveeno over-the-counter and if that is not improving in the next week, please return for recheck. Return sooner if worse.  No change in blood pressure medications for now.  I'll refer you to podiatry for nail care, I expect the new toenail should grow back in without difficulty, but if you have persistent discolored or thickened toenails, please return to discuss other treatments for possible fungal infection.  Please follow-up with me in 6 months for blood pressure assessment, as well as physical to look at other screening tests if needed.  I will check your blood sugar on lab work today.   Return to the clinic or go to the nearest emergency room if any of your symptoms worsen or new symptoms occur.  Insomnia Insomnia is a sleep disorder that makes it difficult to fall asleep or to stay asleep. Insomnia can cause tiredness (fatigue), low energy, difficulty concentrating, mood swings, and poor performance at work or school. There are three different ways to classify insomnia:  Difficulty falling asleep.  Difficulty staying asleep.  Waking up too early in the morning.  Any type of insomnia can be long-term (chronic) or short-term (acute). Both are common. Short-term insomnia usually lasts for three months or less. Chronic insomnia occurs at least three times a week for longer than three months. What are the causes? Insomnia may be caused by another condition, situation, or substance, such as:  Anxiety.  Certain  medicines.  Gastroesophageal reflux disease (GERD) or other gastrointestinal  conditions.  Asthma or other breathing conditions.  Restless legs syndrome, sleep apnea, or other sleep disorders.  Chronic pain.  Menopause. This may include hot flashes.  Stroke.  Abuse of alcohol, tobacco, or illegal drugs.  Depression.  Caffeine.  Neurological disorders, such as Alzheimer disease.  An overactive thyroid (hyperthyroidism).  The cause of insomnia may not be known. What increases the risk? Risk factors for insomnia include:  Gender. Women are more commonly affected than men.  Age. Insomnia is more common as you get older.  Stress. This may involve your professional or personal life.  Income. Insomnia is more common in people with lower income.  Lack of exercise.  Irregular work schedule or night shifts.  Traveling between different time zones.  What are the signs or symptoms? If you have insomnia, trouble falling asleep or trouble staying asleep is the main symptom. This may lead to other symptoms, such as:  Feeling fatigued.  Feeling nervous about going to sleep.  Not feeling rested in the morning.  Having trouble concentrating.  Feeling irritable, anxious, or depressed.  How is this treated? Treatment for insomnia depends on the cause. If your insomnia is caused by an underlying condition, treatment will focus on addressing the condition. Treatment may also include:  Medicines to help you sleep.  Counseling or therapy.  Lifestyle adjustments.  Follow these instructions at home:  Take medicines only as directed by your health care provider.  Keep regular sleeping and waking hours. Avoid naps.  Keep a sleep diary to help you and your health care provider figure out what could be causing your insomnia. Include: ? When you sleep. ? When you wake up during the night. ? How well you sleep. ? How rested you feel the next day. ? Any side effects of medicines you are taking. ? What you eat and drink.  Make your bedroom a  comfortable place where it is easy to fall asleep: ? Put up shades or special blackout curtains to block light from outside. ? Use a white noise machine to block noise. ? Keep the temperature cool.  Exercise regularly as directed by your health care provider. Avoid exercising right before bedtime.  Use relaxation techniques to manage stress. Ask your health care provider to suggest some techniques that may work well for you. These may include: ? Breathing exercises. ? Routines to release muscle tension. ? Visualizing peaceful scenes.  Cut back on alcohol, caffeinated beverages, and cigarettes, especially close to bedtime. These can disrupt your sleep.  Do not overeat or eat spicy foods right before bedtime. This can lead to digestive discomfort that can make it hard for you to sleep.  Limit screen use before bedtime. This includes: ? Watching TV. ? Using your smartphone, tablet, and computer.  Stick to a routine. This can help you fall asleep faster. Try to do a quiet activity, brush your teeth, and go to bed at the same time each night.  Get out of bed if you are still awake after 15 minutes of trying to sleep. Keep the lights down, but try reading or doing a quiet activity. When you feel sleepy, go back to bed.  Make sure that you drive carefully. Avoid driving if you feel very sleepy.  Keep all follow-up appointments as directed by your health care provider. This is important. Contact a health care provider if:  You are tired throughout the day or have trouble in your  daily routine due to sleepiness.  You continue to have sleep problems or your sleep problems get worse. Get help right away if:  You have serious thoughts about hurting yourself or someone else. This information is not intended to replace advice given to you by your health care provider. Make sure you discuss any questions you have with your health care provider. Document Released: 04/14/2000 Document Revised:  09/17/2015 Document Reviewed: 01/16/2014 Elsevier Interactive Patient Education  2018 [employer].     IF you received an x-ray today, you will receive an invoice from [facility]. Please contact [facility] at [phone] with questions or concerns regarding your invoice.   IF you received labwork today, you will receive an invoice from [facility]. Please contact [facility] at [phone] with questions or concerns regarding your invoice.   Our billing staff will not be able to assist you with questions regarding bills from these companies.  You will be contacted with the lab results as soon as they are available. The fastest way to get your results is to activate your My Chart account. Instructions are located on the last page of this paperwork. If you have not heard from [place] regarding the results in 2 weeks, please contact this office.       I personally performed the services described in this documentation, which was scribed in my presence. The recorded information has been reviewed and considered for accuracy and completeness, addended by me as needed, and agree with information above.  Signed,   Meredith Staggers, MD Primary Care at [facility].  02/21/17 2:38 PM  By signing my name below, I, Ivar Bury, attest that this documentation has been prepared under the direction and in the presence of Dr. Meredith Staggers, MD. Electronically Signed: Ivar Bury, Medical Scribe 02/20/2017 at 2:01 PM.

## 2017-02-20 NOTE — Telephone Encounter (Signed)
More info please. We did not discuss sleep issues at last visit 6 months ago. That would be best to discuss in office and can refer then if needed. Also advised 6 month follow up when seen in April.  Can refill meds temporarily if needed, but needs OV.

## 2017-02-21 LAB — COMPREHENSIVE METABOLIC PANEL
ALBUMIN: 4.7 g/dL (ref 3.5–5.5)
ALT: 21 IU/L (ref 0–44)
AST: 18 IU/L (ref 0–40)
Albumin/Globulin Ratio: 2 (ref 1.2–2.2)
Alkaline Phosphatase: 79 IU/L (ref 39–117)
BUN/Creatinine Ratio: 19 (ref 9–20)
BUN: 17 mg/dL (ref 6–24)
Bilirubin Total: 0.4 mg/dL (ref 0.0–1.2)
CALCIUM: 9.6 mg/dL (ref 8.7–10.2)
CHLORIDE: 104 mmol/L (ref 96–106)
CO2: 25 mmol/L (ref 20–29)
CREATININE: 0.88 mg/dL (ref 0.76–1.27)
GFR calc Af Amer: 110 mL/min/{1.73_m2} (ref >59)
GFR, EST NON AFRICAN AMERICAN: 95 mL/min/{1.73_m2} (ref >59)
GLOBULIN, TOTAL: 2.4 g/dL (ref 1.5–4.5)
GLUCOSE: 93 mg/dL (ref 65–99)
POTASSIUM: 4.2 mmol/L (ref 3.5–5.2)
Sodium: 146 mmol/L — ABNORMAL HIGH (ref 134–144)
Total Protein: 7.1 g/dL (ref 6.0–8.5)

## 2017-02-21 LAB — HEMOGLOBIN A1C
Est. average glucose Bld gHb Est-mCnc: 114 mg/dL
HEMOGLOBIN A1C: 5.6 % (ref 4.8–5.6)

## 2017-02-26 DIAGNOSIS — R972 Elevated prostate specific antigen [PSA]: Secondary | ICD-10-CM | POA: Diagnosis not present

## 2017-03-05 DIAGNOSIS — N401 Enlarged prostate with lower urinary tract symptoms: Secondary | ICD-10-CM | POA: Diagnosis not present

## 2017-03-05 DIAGNOSIS — N3941 Urge incontinence: Secondary | ICD-10-CM | POA: Diagnosis not present

## 2017-03-05 DIAGNOSIS — R972 Elevated prostate specific antigen [PSA]: Secondary | ICD-10-CM | POA: Diagnosis not present

## 2017-03-13 ENCOUNTER — Telehealth: Payer: Self-pay | Admitting: Neurology

## 2017-03-13 NOTE — Telephone Encounter (Signed)
Deepti from [facility] called requesting a refill for TECFIDERA 240 MG CPDR

## 2017-03-13 NOTE — Telephone Encounter (Signed)
Attempted to reach patient x 2 to discuss tecfidera Rx request by [facility]. Call could not be completed.   [employer], spoke with Lorin Picket, pharmacist. This RN advised him the tecfidera was refilled x 1 year in Sept, to [facility]. He did not have any detailed information as to how they received the request to get a Rx. He stated it is a new request for this patient, not previously filled by [facility]. This RN confirmed number for patient as same number Lorin Picket has on file. This RN advised will try to reach patient again for clarification. If needed, will call back and give Rx.

## 2017-03-14 NOTE — Telephone Encounter (Addendum)
Called patient who stated his insurance changed Tecfidera pharmacy from [facility] to [facility]. He only has one tablet left. Advised this RN will [facility] now.  Called [facility], spoke with pharmacist, Florentina AddisonKatie and gave her verbal order for Tecfidera 240 mg caps, take one cap twice daily, disp # 60, 11 refills. She repeated Rx correctly and stated the pharmacy will call patient to verify address, will not ship until they speak with patient. This RN called patient back, LVM advising him of call he'll receive from [facility], advised he must answer the phone when they call. Repeated these instructions.

## 2017-03-14 NOTE — Telephone Encounter (Signed)
Attempted to reach patient; message stated "call cannot be completed at this time".

## 2017-03-21 ENCOUNTER — Ambulatory Visit (INDEPENDENT_AMBULATORY_CARE_PROVIDER_SITE_OTHER): Payer: BLUE CROSS/BLUE SHIELD | Admitting: Neurology

## 2017-03-21 ENCOUNTER — Encounter: Payer: Self-pay | Admitting: Neurology

## 2017-03-21 VITALS — BP 149/91 | HR 77

## 2017-03-21 DIAGNOSIS — G35 Multiple sclerosis: Secondary | ICD-10-CM | POA: Diagnosis not present

## 2017-03-21 DIAGNOSIS — Z5181 Encounter for therapeutic drug level monitoring: Secondary | ICD-10-CM | POA: Diagnosis not present

## 2017-03-21 NOTE — Progress Notes (Signed)
Called and spoke with [facility] at [phone] and spoke with Arlys John. Advised I wanted to check on shipment status of rx Tecfidera that pt was supposed to receive yesterday. He stated pt scheduled to receive rx this Friday at 1030am. Cannot expedite further.

## 2017-03-21 NOTE — Progress Notes (Signed)
Reason for visit: Multiple sclerosis  Randy Shaw is an 56 y.o. male  History of present illness:  Randy Shaw is a 56 year old right-handed black male with a history of multiple sclerosis.  The patient has a spastic quadriparesis associated with this, he is able to bear some weight at times, but he is not able to walk effectively.  The patient uses a motorized wheelchair for mobility.  He has not had any recent falls.  He has spasticity in both legs, left greater than right, he is on baclofen for this.  The patient has some oscillopsia, he has had a prior left INO on clinical examination.  MRI of the brain and cervical spine were last done in December 2017.  The patient has been on Tecfidera, he ran out of his medication 6 days ago, but he will be getting the medication back within the next 2 days.  The patient has tolerated the medication well.  He denies any new numbness or weakness of the face, arms, or legs.  He has urgency with the bladder, he keeps a urinal at bedside.  He returns for an evaluation.  He denies any significant problems with choking with swallowing.  Past Medical History:  Diagnosis Date  . Abnormality of gait 04/22/2013  . Asthma   . BPH (benign prostatic hyperplasia)   . Hypertension   . Multiple sclerosis (HCC)   . 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 none      Family History  Problem Relation Age of Onset  . Cancer Mother        breast  . Stroke Father   . Cancer - Colon Father   . Multiple sclerosis Neg Hx     Social history:  reports that  has never smoked. he has never used smokeless tobacco. He reports that he drinks alcohol. He reports that he does not use drugs.    Allergies  Allergen Reactions  . Peanut-Containing Drug Products Shortness Of Breath  . Zanaflex [Tizanidine Hcl] Shortness Of Breath    Medications:  Prior to Admission medications   Medication Sig Start  Date End Date Taking? Authorizing Provider  baclofen (LIORESAL) 20 MG tablet TAKE 1 TABLET IN THE MORNING, 1 AT NOON, AND 2 AT NIGHT 10/03/16   Levert FeinsteinYan, Yijun, MD  hydrochlorothiazide (MICROZIDE) 12.5 MG capsule TAKE 1 CAPSULE(12.5 MG) BY MOUTH DAILY 02/20/17   Shade FloodGreene, Jeffrey R, MD  mirabegron ER (MYRBETRIQ) 50 MG TB24 tablet Take 50 mg by mouth daily.    [provider]  tamsulosin (FLOMAX) 0.4 MG CAPS capsule Take 1 capsule (0.4 mg total) by mouth daily. 07/01/14   Tonye Pearsonoolittle, Robert P, MD  TECFIDERA 240 MG CPDR TAKE 1 CAPSULE BY MOUTH TWICE DAILY 01/15/17   York SpanielWillis, Ziyad K, MD    ROS:  Out of a complete 14 system review of symptoms, the patient complains only of the following symptoms, and all other reviewed systems are negative.  Urinary urgency Frequent waking, snoring Tremors  Blood pressure (!) 149/91, pulse 77, SpO2 97 %.  Physical Exam  General: The patient is alert and cooperative at the time of the examination.  Skin: No significant peripheral edema is noted.   Neurologic Exam  Mental status: The patient is alert and oriented x 3 at the time of the examination. The patient has apparent normal recent and remote memory, with an apparently normal attention span and concentration ability.  Cranial nerves: Facial symmetry is present. Speech is normal, no aphasia or dysarthria is noted. Extraocular movements are full, with exception that there is incomplete adduction of the left eye with right lateral gaze. Visual fields are full.  Pupils are equal, round, and reactive to light.  Discs are flat bilaterally.  Some nystagmus is seen on primary gaze.  Motor: The patient has good strength in the upper extremities.  With the lower extremities, increased motor tone is noted bilaterally, left greater than right.  The patient has 4/5 strength with hip flexion on the left, 4+/5 strength on the right.  When the patient will extend the left leg, he cannot flex the knee again.  He is able  to flex the knee on the right leg after extension.  Sensory examination: Soft touch sensation is symmetric on the face and arms, decreased on the left leg as compared to the right.  Coordination: The patient has dysmetria with finger-nose-finger bilaterally, he is unable to perform heel-to-shin on either side.  Gait and station: The patient could not be ambulated, he is wheelchair-bound.  Reflexes: Deep tendon reflexes are symmetric.   Assessment/Plan:  1.  Multiple sclerosis  2.  Gait disorder  3.  Spastic quadriparesis  If need be, tizanidine can be added to the baclofen for spasticity, the patient does not wish to go on a new medication at this time.  He will get back on the Tecfidera in the near future.  Blood work will be done today to include a CBC and differential.  He will follow-up in 6 months, we may consider MRI of the brain and cervical spine 1 year from now.   Marlan Palau MD 03/21/2017 12:27 PM  [facility] [address] [place], [place] [address]  Phone [phone] Fax [phone]

## 2017-03-22 LAB — CBC WITH DIFFERENTIAL/PLATELET
BASOS ABS: 0 10*3/uL (ref 0.0–0.2)
Basos: 1 %
EOS (ABSOLUTE): 0.2 10*3/uL (ref 0.0–0.4)
Eos: 4 %
Hematocrit: 43 % (ref 37.5–51.0)
Hemoglobin: 14.3 g/dL (ref 13.0–17.7)
Immature Grans (Abs): 0 10*3/uL (ref 0.0–0.1)
Immature Granulocytes: 0 %
LYMPHS ABS: 1.8 10*3/uL (ref 0.7–3.1)
Lymphs: 35 %
MCH: 27.9 pg (ref 26.6–33.0)
MCHC: 33.3 g/dL (ref 31.5–35.7)
MCV: 84 fL (ref 79–97)
MONOCYTES: 8 %
Monocytes Absolute: 0.4 10*3/uL (ref 0.1–0.9)
NEUTROS ABS: 2.8 10*3/uL (ref 1.4–7.0)
Neutrophils: 52 %
PLATELETS: 222 10*3/uL (ref 150–379)
RBC: 5.12 x10E6/uL (ref 4.14–5.80)
RDW: 13.6 % (ref 12.3–15.4)
WBC: 5.3 10*3/uL (ref 3.4–10.8)

## 2017-03-26 ENCOUNTER — Ambulatory Visit (INDEPENDENT_AMBULATORY_CARE_PROVIDER_SITE_OTHER): Payer: BLUE CROSS/BLUE SHIELD | Admitting: Podiatry

## 2017-03-26 ENCOUNTER — Encounter: Payer: Self-pay | Admitting: Podiatry

## 2017-03-26 VITALS — BP 178/96 | HR 92

## 2017-03-26 DIAGNOSIS — M79674 Pain in right toe(s): Secondary | ICD-10-CM | POA: Diagnosis not present

## 2017-03-26 DIAGNOSIS — G35 Multiple sclerosis: Secondary | ICD-10-CM | POA: Diagnosis not present

## 2017-03-26 DIAGNOSIS — M79675 Pain in left toe(s): Secondary | ICD-10-CM

## 2017-03-26 DIAGNOSIS — B351 Tinea unguium: Secondary | ICD-10-CM

## 2017-03-26 NOTE — Progress Notes (Signed)
   Subjective:    Patient ID: Randy Shaw, male    DOB: [birth year], 56 y.o.   MRN: [id number]  HPI    Review of Systems     Objective:   Physical Exam        Assessment & Plan:

## 2017-03-26 NOTE — Progress Notes (Signed)
   Subjective:    Patient ID: Randy Shaw, male    DOB: 11-28-1960, 56 y.o.   MRN: [id number]  HPI This patient presents today complaining of approximately 2 year history of thickened and elongated and deformed toenails feet. Over this time. The nails become more deformed more comfortable patient is unable to trim these nails himself. Also, patient has MS make a self debridement even more difficult    Review of Systems  All other systems reviewed and are negative.      Objective:   Physical Exam Orientated 3 Patient is seated in a wheelchair with difficulty was able to transfer to treatment table  Objective: DP pulses 2/4 bilaterally PT pulses 2/4 bilaterally Reflex within normal limits bilaterally  Neurological: Sensation to 10 g monofilament wire intact 8/8 bilaterally Vibratory sensation nonreactive bilaterally Ankle reflexes reactive bilaterally  has rigid spastic type movements in lower extremities  Dermatological: No open skin lesions bilaterally The toenails are elongated, incurvated, deformed, discolored and tender direct palpation  Musculoskeletal: Spastic upper and lower extremity movements Dorsi flexion 4/5 right and 0/4 left plantar flexion 4/5 right and 0/5 left       Assessment & Plan:   Assessment: 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

## 2017-03-27 ENCOUNTER — Ambulatory Visit (INDEPENDENT_AMBULATORY_CARE_PROVIDER_SITE_OTHER): Payer: BLUE CROSS/BLUE SHIELD | Admitting: Neurology

## 2017-03-27 ENCOUNTER — Encounter: Payer: Self-pay | Admitting: Neurology

## 2017-03-27 VITALS — BP 141/92 | HR 91

## 2017-03-27 DIAGNOSIS — G35 Multiple sclerosis: Secondary | ICD-10-CM | POA: Diagnosis not present

## 2017-03-27 DIAGNOSIS — R0683 Snoring: Secondary | ICD-10-CM | POA: Diagnosis not present

## 2017-03-27 DIAGNOSIS — G8250 Quadriplegia, unspecified: Secondary | ICD-10-CM

## 2017-03-27 DIAGNOSIS — G4719 Other hypersomnia: Secondary | ICD-10-CM | POA: Diagnosis not present

## 2017-03-27 DIAGNOSIS — G478 Other sleep disorders: Secondary | ICD-10-CM

## 2017-03-27 DIAGNOSIS — G479 Sleep disorder, unspecified: Secondary | ICD-10-CM | POA: Diagnosis not present

## 2017-03-27 DIAGNOSIS — Z820 Family history of epilepsy and other diseases of the nervous system: Secondary | ICD-10-CM

## 2017-03-27 DIAGNOSIS — F515 Nightmare disorder: Secondary | ICD-10-CM

## 2017-03-27 NOTE — Progress Notes (Signed)
Subjective:    Patient ID: Randy Shaw is a 56 y.o. male.  HPI     Randy Foley, MD, PhD [facility] [address], [address] [address] [place], [place] [address]  Dear Dr. Neva Seat,   I saw your patient, Randy Shaw headaches, upon your kind request in my neurologic clinic today for initial consultation of his sleep disorder, in particular, concern for underlying obstructive sleep apnea. The patient is unaccompanied today. As you know, Randy Shaw is a 56 year old right-handed gentleman with an underlying medical history of multiple sclerosis with associated spastic quadriparesis, followed by Dr. Anne Hahn, hypertension, BPH, and overweight state, who reports snoring and excessive daytime somnolence, sleep disruption. He has nonrestorative sleep. His brother has sleep apnea and uses a CPAP machine. He lives alone. He used to be a Emergency planning/management officer. He has 3 grown children. He has been divorced since 2010. He reports that he has not been sleeping well since 2010. He has tried over-the-counter sleep aids which helped but make him groggy the next day. Baclofen at night helps him fall asleep. He does not stay asleep well. He does not have night to night nocturia, uses a urinal typically. He is weaker on the left side. He uses a motorized wheelchair. He is a nonsmoker. He drinks caffeine very little. Bedtime is around midnight and wake up time around 8. He twitches sometimes at night but denies restless leg symptoms. I reviewed your office note from 02/20/2017. His Epworth sleepiness score is 7 out of 24 today, fatigue score is 28 out of 63. He reports having nightmares at times.  His Past Medical History Is Significant For: Past Medical History:  Diagnosis Date  . Abnormality of gait 04/22/2013  . Asthma   . BPH (benign prostatic hyperplasia)   . Hypertension   . Multiple sclerosis (HCC)   . 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    His Past Surgical History Is Significant  For: Past Surgical History:  Procedure Laterality Date  . none      His Family History Is Significant For: Family History  Problem Relation Age of Onset  . Cancer Mother        breast  . Stroke Father   . Cancer - Colon Father   . Multiple sclerosis Neg Hx     His Social History Is Significant For: Social History   Socioeconomic History  . Marital status: Divorced    Spouse name: n/a  . Number of children: 3  . Years of education: 15  . Highest education level: None  Social Needs  . Financial resource strain: None  . Food insecurity - worry: None  . Food insecurity - inability: None  . Transportation needs - medical: None  . Transportation needs - non-medical: None  Occupational History  . Occupation: disabled/retired [employer]-[place]    Comment: Event organiser  Tobacco Use  . Smoking status: Never Smoker  . Smokeless tobacco: Never Used  Substance and Sexual Activity  . Alcohol use: Yes    Alcohol/week: 0.0 oz    Comment: occasionally wine cooler  . Drug use: No  . Sexual activity: No  Other Topics Concern  . None  Social History Narrative   Lives alone, house.  3 children.  Caffeine none.  Green Tea once week.      His Allergies Are:  Allergies  Allergen Reactions  . Peanut-Containing Drug Products Shortness Of Breath  . Zanaflex [Tizanidine Hcl] Shortness Of Breath  :   His  Current Medications Are:  Outpatient Encounter Medications as of 03/27/2017  Medication Sig  . baclofen (LIORESAL) 20 MG tablet TAKE 1 TABLET IN THE MORNING, 1 AT NOON, AND 2 AT NIGHT  . hydrochlorothiazide (MICROZIDE) 12.5 MG capsule TAKE 1 CAPSULE(12.5 MG) BY MOUTH DAILY  . mirabegron ER (MYRBETRIQ) 50 MG TB24 tablet Take 50 mg by mouth daily.  . tamsulosin (FLOMAX) 0.4 MG CAPS capsule Take 1 capsule (0.4 mg total) by mouth daily.  . TECFIDERA 240 MG CPDR TAKE 1 CAPSULE BY MOUTH TWICE DAILY   No facility-administered encounter medications on file as of 03/27/2017.    :  Review of Systems:  Out of a complete 14 point review of systems, all are reviewed and negative with the exception of these symptoms as listed below: Review of Systems  Neurological:       Pt presents today to discuss his sleep. Pt has never had a sleep study but does endorse snoring.  Epworth Sleepiness Scale 0= would never doze 1= slight chance of dozing 2= moderate chance of dozing 3= high chance of dozing  Sitting and reading: 0 Watching TV: 1 Sitting inactive in a public place (ex. Theater or meeting): 1 As a passenger in a car for an hour without a break: 1 Lying down to rest in the afternoon: 2 Sitting and talking to someone: 0 Sitting quietly after lunch (no alcohol): 2 In a car, while stopped in traffic: 0 Total: 7     Objective:  Neurological Exam  Physical Exam Physical Examination:   Vitals:   03/27/17 1309  BP: (!) 141/92  Pulse: 91    General Examination: The patient is a very pleasant 56 y.o. male in no acute distress. He appears well-developed and well-nourished and well groomed.   HEENT: Normocephalic, atraumatic, pupils are equal, round and reactive to light and accommodation. Speech is mildly dysarthric. Airway examination reveals no significant airway crowding, larger tongue is noted. Tonsils are small. Neck circumference is 15-7/8 inches. Extraocular tracking is fairly well-preserved. Face is symmetric. Hearing is grossly intact. There are no carotid bruits on auscultation. Tongue protrudes centrally and palate elevates symmetrically.    Chest: Clear to auscultation without wheezing, rhonchi or crackles noted.  Heart: S1+S2+0, regular and normal without murmurs, rubs or gallops noted.   Abdomen: Soft, non-tender and non-distended with normal bowel sounds appreciated on auscultation.  Extremities: There is no pitting edema.   Skin: Warm and dry without trophic changes noted.  Musculoskeletal: exam reveals no obvious joint deformities,  tenderness or joint swelling or erythema.   Neurologically:  Mental status: The patient is awake, alert and oriented in all 4 spheres. His immediate and remote memory, attention, language skills and fund of knowledge are appropriate. There is no evidence of aphasia, agnosia, apraxia or anomia. Speech is clear with normal prosody and enunciation. Thought process is linear. Mood is normal and affect is blunted. Mildly anxious appearing.  Cranial nerves II - XII are as described above under HEENT exam. In addition: shoulder shrug is normal with equal shoulder height noted. Motor exam: Normal bulk, strength 3 out of 5 on the left and 4 out of 5 on the right. Romberg is not testable. Reflexes are 1-2+ throughout. Fine motor skills and coordination: impaired more so on the left.  Sensory exam: intact to light touch.  Gait, station and balance: not tested, in electric WC.   Assessment and Plan:   In summary, Randy Shaw is a very pleasant 57 y.o.-year old  male with an underlying medical history of multiple sclerosis with associated spastic quadriparesis, followed by Dr. Anne HahnWillis, hypertension, BPH, and overweight state, who presents for consultation of his sleep problems including sleep disruption, nonrestorative sleep, snoring, and daytime tiredness. I talked about sleep difficulties and possible underlying organic causes of his sleep problems. He does admit to having some symptoms of depression. He may benefit from treating his depression. Of note, his sleep difficulties started after his divorce in 2010. He would be willing to come in for sleep study. If he has sleep apnea he may benefit from treating this with CPAP, he would be willing to try this. I had a long chat with the patient about my findings and the diagnosis of OSA, its prognosis and treatment options. We talked about medical treatments, surgical interventions and non-pharmacological approaches. I explained in particular the risks and  ramifications of untreated moderate to severe OSA, especially with respect to developing cardiovascular disease down the Road, including congestive heart failure, difficult to treat hypertension, cardiac arrhythmias, or stroke. Even type 2 diabetes has, in part, been linked to untreated OSA. Symptoms of untreated OSA include daytime sleepiness, memory problems, mood irritability and mood disorder such as depression and anxiety, lack of energy, as well as recurrent headaches, especially morning headaches. We talked about trying to maintain a healthy lifestyle in general. I recommended the following at this time: sleep study with potential positive airway pressure titration. (We will score hypopneas at 3%).  I answered all his questions today and the patient was in agreement. I would like to see him back after the sleep study is completed and encouraged him to call with any interim questions, concerns, problems or updates.   Thank you very much for allowing me to participate in the care of this nice patient. If I can be of any further assistance to you please do not hesitate to call me at [phone].  Sincerely,   Randy FoleySaima Gentle Hoge, MD, PhD

## 2017-03-27 NOTE — Patient Instructions (Addendum)
Thank you for choosing [facility] for your sleep related care! It was nice to meet you today! I appreciate that you entrust me with your sleep related healthcare concerns. I hope, I was able to address at least some of your concerns today, and that I can help you feel reassured and also get better.    Here is what we discussed today and what we came up with as our plan for you:    Based on your symptoms and your exam I believe you may be at risk for obstructive sleep apnea or OSA, and I think we should proceed with a sleep study to determine whether you do or do not have OSA and how severe it is. If you have more than mild OSA, I want you to consider treatment with CPAP. Please remember, the risks and ramifications of moderate to severe obstructive sleep apnea or OSA are: Cardiovascular disease, including congestive heart failure, stroke, difficult to control hypertension, arrhythmias, and even type 2 diabetes has been linked to untreated OSA. Sleep apnea causes disruption of sleep and sleep deprivation in most cases, which, in turn, can cause recurrent headaches, problems with memory, mood, concentration, focus, and vigilance. Most people with untreated sleep apnea report excessive daytime sleepiness, which can affect their ability to drive. Please do not drive if you feel sleepy.   I will likely see you back after your sleep study to go over the test results and where to go from there. We will call you after your sleep study to advise about the results (most likely, you will hear from Angier, my nurse) and to set up an appointment at the time, as necessary.    Our sleep lab administrative assistant will call you to schedule your sleep study. If you don't hear back from her by about 2 weeks from now, please feel free to call her at [phone]. You can leave a message with your phone number and concerns, if you get the voicemail box. She will call back as soon as possible.

## 2017-03-29 ENCOUNTER — Telehealth: Payer: Self-pay

## 2017-03-29 DIAGNOSIS — G479 Sleep disorder, unspecified: Secondary | ICD-10-CM

## 2017-03-29 NOTE — Telephone Encounter (Signed)
HST order placed. 

## 2017-03-29 NOTE — Telephone Encounter (Signed)
Insurance denied in-lab study. Need HST order

## 2017-04-19 NOTE — Telephone Encounter (Signed)
error 

## 2017-07-05 ENCOUNTER — Telehealth: Payer: Self-pay | Admitting: Neurology

## 2017-07-05 NOTE — Telephone Encounter (Signed)
Called pt. He has had in the last week a new onset of trouble thinking, speaking and focusing. Denies starting any new meds. Taking Tecfidera with no issues. Made appt for tomorrow at 8am, check in 745am with Dr. Anne Hahn. Pt verbalized understanding and appreciation for call.

## 2017-07-05 NOTE — Telephone Encounter (Signed)
Pt states he is having difficulty thinking, speaking and focusing.  Pt states it is very weird and he doesn't know if this has to do with MS progressing.  Pt asked for first available appointment. He is also on wait list.  Please call

## 2017-07-06 ENCOUNTER — Ambulatory Visit: Payer: BLUE CROSS/BLUE SHIELD | Admitting: Neurology

## 2017-07-06 ENCOUNTER — Encounter: Payer: Self-pay | Admitting: Neurology

## 2017-07-06 VITALS — BP 131/82 | HR 83

## 2017-07-06 DIAGNOSIS — G8250 Quadriplegia, unspecified: Secondary | ICD-10-CM | POA: Diagnosis not present

## 2017-07-06 DIAGNOSIS — Z5181 Encounter for therapeutic drug level monitoring: Secondary | ICD-10-CM

## 2017-07-06 DIAGNOSIS — R269 Unspecified abnormalities of gait and mobility: Secondary | ICD-10-CM | POA: Diagnosis not present

## 2017-07-06 DIAGNOSIS — R410 Disorientation, unspecified: Secondary | ICD-10-CM | POA: Diagnosis not present

## 2017-07-06 DIAGNOSIS — G35 Multiple sclerosis: Secondary | ICD-10-CM

## 2017-07-06 MED ORDER — PREDNISONE 10 MG PO TABS: ORAL_TABLET | ORAL | 0 refills | Status: DC

## 2017-07-06 NOTE — Progress Notes (Signed)
Reason for visit: Multiple sclerosis  JASAUN CARN is an 57 y.o. male  History of present illness:  Mr. Kemler is a 58 year old right-handed black male with a history of multiple sclerosis associated with a spastic quadriparesis.  The patient has multiple brain and spinal cord lesions, black holes are noted on prior MRI that was done in December 2017.  The patient comes in today on an urgent basis for evaluation of new symptoms that began at least 2 weeks ago.  The patient has noted that he has had some cognitive changes, he is having difficulty processing information, he seems to be confused.  He denies any overt fatigue associated with the symptoms.  He believes that he is having some increased blurred vision and double vision.  He has not noted definite changes in bowel or bladder function or any new numbness or new weakness of the face, arms, or legs.  He has not lost vision.  The patient lives alone.  He does not believe that there has been any mistakes made with his medications, he does have a pill dispenser.  He denies any new medications that have been added to the regimen.  He denies headaches or dizziness.  He did fall within the last month or 2, and he did bump his head.  He comes in today for further medical evaluation.  He denies fevers or chills or night sweats.  Past Medical History:  Diagnosis Date  . Abnormality of gait 04/22/2013  . Asthma   . BPH (benign prostatic hyperplasia)   . Hypertension   . Multiple sclerosis (HCC)   . 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 none      Family History  Problem Relation Age of Onset  . Cancer Mother        breast  . Stroke Father   . Cancer - Colon Father   . Multiple sclerosis Neg Hx     Social history:  reports that  has never smoked. he has never used smokeless tobacco. He reports that he drinks alcohol. He reports that he does not use drugs.      Allergies  Allergen Reactions  . Peanut-Containing Drug Products Shortness Of Breath  . Zanaflex [Tizanidine Hcl] Shortness Of Breath    Medications:  Prior to Admission medications   Medication Sig Start Date End Date Taking? Authorizing Provider  baclofen (LIORESAL) 20 MG tablet TAKE 1 TABLET IN THE MORNING, 1 AT NOON, AND 2 AT NIGHT 10/03/16   Levert Feinstein, MD  hydrochlorothiazide (MICROZIDE) 12.5 MG capsule TAKE 1 CAPSULE(12.5 MG) BY MOUTH DAILY 02/20/17   Shade Flood, MD  mirabegron ER (MYRBETRIQ) 50 MG TB24 tablet Take 50 mg by mouth daily.    [provider]  tamsulosin (FLOMAX) 0.4 MG CAPS capsule Take 1 capsule (0.4 mg total) by mouth daily. 07/01/14   Tonye Pearson, MD  TECFIDERA 240 MG CPDR TAKE 1 CAPSULE BY MOUTH TWICE DAILY 01/15/17   York Spaniel, MD    ROS:  Out of a complete 14 system review of symptoms, the patient complains only of the following symptoms, and all other reviewed systems are negative.  Urinary urgency Walking difficulty Insomnia Memory loss, speech difficulty, tremors Confusion  There were no vitals taken for this visit.  Physical Exam  General: The patient is alert and cooperative at the time of the examination.  Respiratory: Lung fields are clear.  Cardiovascular:  Regular rate and rhythm, no murmurs or rubs are noted.  Eyes: Pupils are equal, round, and reactive to light.  Discs are flat bilaterally.  Skin: No significant peripheral edema is noted.   Neurologic Exam  Mental status: The patient is alert and oriented x 3 at the time of the examination. The patient has apparent normal recent and remote memory, with an apparently normal attention span and concentration ability.   Cranial nerves: Facial symmetry is present. Speech is normal, no aphasia or dysarthria is noted. Extraocular movements are full, with exception that the patient has a prominent left INO. Visual fields are full.  Motor: The patient has good  strength in the right upper extremity.  The patient has 4/5 strength with intrinsic muscles of the left hand, fairly good strength proximally with the left arm.  The patient has increased motor tone with the left leg, he can straighten leg but cannot flex at that, I foot drop was noted on the left.  The patient has 4+/5 strength in the right lower extremity.  Sensory examination: Soft touch sensation is symmetric on the face.  There is some decreased pinprick sensations on the forearms bilaterally, decreased vibration sensation on the left hand as compared to the right.  With the lower extremities, pinprick sensation is decreased on the left leg, vibration sensation is more decreased on the right leg than the left.  Coordination: The patient has some dysmetria with finger-nose-finger bilaterally, left greater than right.  The patient is not able to perform heel-to-shin with the left leg, he is able to perform this with the right leg.  There is some dysmetria with this.  Gait and station: The patient is nonambulatory, he is in a power wheelchair.  Reflexes: Deep tendon reflexes are slightly brisk in the right arm, decreased in the left.  Reflexes are symmetric in the legs but are slightly decreased.   MRI brain 03/31/16:  IMPRESSION:  This MRI of the brain with and without contrast shows the following: 1.    Multiple infratentorial and supratentorial T2/FLAIR hyperintense foci in a pattern and configuration consistent with chronic demyelinating plaque associated with multiple sclerosis. None of the foci appears to be acute and none of them enhance. When compared to the MRI dated 11/26/2013, there is no interval change. 2.    There are no acute findings. There is a normal enhancement pattern.  * MRI scan images were reviewed online. I agree with the written report.   MRI cervical 03/31/16:  IMPRESSION:  This MRI of the cervical spine with and without contrast shows the following: 1.    Multiple  T2 hyperintense foci within the spinal cord on the cervicomedullary junction to the C6 level as detailed above, consistent with chronic demyelinating plaque associated with multiple sclerosis. 2.    There is a normal enhancement pattern. None of the foci appeared to be acute. When compared to the MRI dated 11/26/2013, there is no interval change.  * MRI scan images were reviewed online. I agree with the written report.   Assessment/Plan:  1.  Multiple sclerosis  2.  Spastic quadriparesis, gait disorder  3.  New cognitive changes, possible MS exacerbation  The patient will be sent for blood work today and urinalysis.  He will have MRI of the brain and cervical spine, if this shows progression of MS, the patient will be considered for alteration in medical therapy to include Ocrevus.  The patient will be placed on a prednisone Dosepak, 10 mg 12-day  pack.  He will follow-up for his next scheduled revisit in May.  Marlan Palau MD 07/06/2017 7:39 AM  [facility] [address] [place], [place] [address]  Phone [phone] Fax [phone]

## 2017-07-06 NOTE — Patient Instructions (Signed)
   We will get MRI of the brain and neck. We will start a prednisone dosepack to help these new symptoms.

## 2017-07-09 ENCOUNTER — Telehealth: Payer: Self-pay | Admitting: Neurology

## 2017-07-09 LAB — URINALYSIS, ROUTINE W REFLEX MICROSCOPIC
Bilirubin, UA: NEGATIVE
GLUCOSE, UA: NEGATIVE
Ketones, UA: NEGATIVE
Leukocytes, UA: NEGATIVE
NITRITE UA: NEGATIVE
RBC, UA: NEGATIVE
Specific Gravity, UA: >=1.03 — AB (ref 1.005–1.030)
UUROB: 1 mg/dL (ref 0.2–1.0)
pH, UA: 5 (ref 5.0–7.5)

## 2017-07-09 LAB — CBC WITH DIFFERENTIAL/PLATELET
Basophils Absolute: 0 10*3/uL (ref 0.0–0.2)
Basos: 1 %
EOS (ABSOLUTE): 0.3 10*3/uL (ref 0.0–0.4)
Eos: 5 %
HEMATOCRIT: 43.6 % (ref 37.5–51.0)
HEMOGLOBIN: 14.3 g/dL (ref 13.0–17.7)
IMMATURE GRANULOCYTES: 0 %
Immature Grans (Abs): 0 10*3/uL (ref 0.0–0.1)
LYMPHS: 40 %
Lymphocytes Absolute: 2.2 10*3/uL (ref 0.7–3.1)
MCH: 27.3 pg (ref 26.6–33.0)
MCHC: 32.8 g/dL (ref 31.5–35.7)
MCV: 83 fL (ref 79–97)
MONOCYTES: 7 %
Monocytes Absolute: 0.4 10*3/uL (ref 0.1–0.9)
NEUTROS PCT: 47 %
Neutrophils Absolute: 2.6 10*3/uL (ref 1.4–7.0)
Platelets: 229 10*3/uL (ref 150–379)
RBC: 5.23 x10E6/uL (ref 4.14–5.80)
RDW: 13.4 % (ref 12.3–15.4)
WBC: 5.5 10*3/uL (ref 3.4–10.8)

## 2017-07-09 LAB — COMPREHENSIVE METABOLIC PANEL
ALK PHOS: 77 IU/L (ref 39–117)
ALT: 16 IU/L (ref 0–44)
AST: 15 IU/L (ref 0–40)
Albumin/Globulin Ratio: 1.5 (ref 1.2–2.2)
Albumin: 4.4 g/dL (ref 3.5–5.5)
BUN / CREAT RATIO: 17 (ref 9–20)
BUN: 15 mg/dL (ref 6–24)
Bilirubin Total: 0.4 mg/dL (ref 0.0–1.2)
CO2: 29 mmol/L (ref 20–29)
Calcium: 9.7 mg/dL (ref 8.7–10.2)
Chloride: 103 mmol/L (ref 96–106)
Creatinine, Ser: 0.88 mg/dL (ref 0.76–1.27)
GFR calc Af Amer: 110 mL/min/{1.73_m2} (ref >59)
GFR calc non Af Amer: 95 mL/min/{1.73_m2} (ref >59)
GLUCOSE: 111 mg/dL — AB (ref 65–99)
Globulin, Total: 2.9 g/dL (ref 1.5–4.5)
Potassium: 3.9 mmol/L (ref 3.5–5.2)
Sodium: 145 mmol/L — ABNORMAL HIGH (ref 134–144)
Total Protein: 7.3 g/dL (ref 6.0–8.5)

## 2017-07-09 LAB — VITAMIN B12: VITAMIN B 12: 343 pg/mL (ref 232–1245)

## 2017-07-09 LAB — NEUROMYELITIS OPTICA AUTOAB, IGG: NMO IgG Autoantibodies: <1.5 U/mL (ref 0.0–3.0)

## 2017-07-09 LAB — TSH: TSH: 3.57 u[IU]/mL (ref 0.450–4.500)

## 2017-07-09 LAB — AMMONIA: AMMONIA: 64 ug/dL (ref 27–102)

## 2017-07-09 NOTE — Telephone Encounter (Signed)
Pt returned my call. He is scheduled for Tues. 07/17/17 at the [facility] mobile unit.

## 2017-07-09 NOTE — Telephone Encounter (Signed)
07/09/17 [employer] Auth: [id number] (exp. 07/09/17 to 08/07/17) EE 07/09/17 lvm for pt to call back about [facility] mri EE

## 2017-07-17 ENCOUNTER — Ambulatory Visit (INDEPENDENT_AMBULATORY_CARE_PROVIDER_SITE_OTHER): Payer: BLUE CROSS/BLUE SHIELD

## 2017-07-17 DIAGNOSIS — G35 Multiple sclerosis: Secondary | ICD-10-CM | POA: Diagnosis not present

## 2017-07-17 MED ORDER — GADOPENTETATE DIMEGLUMINE 469.01 MG/ML IV SOLN: 20.0000 mL | Freq: Once | INTRAVENOUS | Status: DC | PRN

## 2017-07-18 ENCOUNTER — Telehealth: Payer: Self-pay | Admitting: Neurology

## 2017-07-18 NOTE — Telephone Encounter (Signed)
  I called the patient. The MRI of the brain and cervical spine is unchanged from 12/17. We will continue the Tecfidera. I discussed this with the patient.  MRI brain 07/18/17:  IMPRESSION:   Abnormal MRI brain (with and without) demonstrating: 1. Multiple round and ovoid, periventricular, subcortical, juxtacortical, left pontine and left middle cerebellar peduncle chronic demyelinating plaques.   2. No abnormal lesions are seen on post contrast views.  3. No change from MRI on 03/31/16.    MRI cervical 07/18/17:  IMPRESSION:   Abnormal MRI cervical spine (with and without) demonstrating: 1. Hazy ill-defined T2 hyperintensities throughout the cervical spinal cord (C2-C7), consistent with chronic demyelinating disease. 2. No acute demyelinating plaques. 3. No significant change from MRI on 03/31/16.

## 2017-08-23 ENCOUNTER — Ambulatory Visit (INDEPENDENT_AMBULATORY_CARE_PROVIDER_SITE_OTHER): Payer: BLUE CROSS/BLUE SHIELD | Admitting: Family Medicine

## 2017-08-23 ENCOUNTER — Encounter: Payer: Self-pay | Admitting: Family Medicine

## 2017-08-23 VITALS — BP 132/76 | HR 86 | Temp 97.8°F | Ht 75.0 in | Wt 219.0 lb

## 2017-08-23 DIAGNOSIS — Z23 Encounter for immunization: Secondary | ICD-10-CM | POA: Diagnosis not present

## 2017-08-23 DIAGNOSIS — M24541 Contracture, right hand: Secondary | ICD-10-CM | POA: Diagnosis not present

## 2017-08-23 DIAGNOSIS — G35 Multiple sclerosis: Secondary | ICD-10-CM | POA: Diagnosis not present

## 2017-08-23 DIAGNOSIS — Z13220 Encounter for screening for lipoid disorders: Secondary | ICD-10-CM | POA: Diagnosis not present

## 2017-08-23 DIAGNOSIS — Z0000 Encounter for general adult medical examination without abnormal findings: Secondary | ICD-10-CM | POA: Diagnosis not present

## 2017-08-23 DIAGNOSIS — R739 Hyperglycemia, unspecified: Secondary | ICD-10-CM

## 2017-08-23 DIAGNOSIS — G479 Sleep disorder, unspecified: Secondary | ICD-10-CM

## 2017-08-23 DIAGNOSIS — I10 Essential (primary) hypertension: Secondary | ICD-10-CM

## 2017-08-23 MED ORDER — ZOSTER VAC RECOMB ADJUVANTED 50 MCG/0.5ML IM SUSR: 0.5000 mL | Freq: Once | INTRAMUSCULAR | 1 refills | Status: AC

## 2017-08-23 MED ORDER — HYDROCHLOROTHIAZIDE 12.5 MG PO CAPS: ORAL_CAPSULE | ORAL | 2 refills | Status: DC

## 2017-08-23 NOTE — Patient Instructions (Addendum)
Please call sleep specialist to arrange for home sleep test. [phone]  Continue follow up with neurologist and urologist as planned.   No change in blood pressure medication for now.  I will check some lab work today, but if cholesterol is elevated, may need to repeat after 8 hours of fasting.  He had a possible contracture of the right hand.  I will refer you to hand specialist to discuss that further.  If any symptoms of depression, please return to discuss the symptoms further and possible treatments.  Thank you for coming in today.  Recheck in 6 months  Keeping you healthy  Get these tests  Blood pressure- Have your blood pressure checked once a year by your healthcare provider.  Normal blood pressure is 120/80  Weight- Have your body mass index (BMI) calculated to screen for obesity.  BMI is a measure of body fat based on height and weight. You can also calculate your own BMI at [email or web address].  Cholesterol- Have your cholesterol checked every year.  Diabetes- Have your blood sugar checked regularly if you have high blood pressure, high cholesterol, have a family history of diabetes or if you are overweight.  Screening for Colon Cancer- Colonoscopy starting at age 26.  Screening may begin sooner depending on your family history and other health conditions. Follow up colonoscopy as directed by your Gastroenterologist.  Screening for Prostate Cancer- Both blood work (PSA) and a rectal exam help screen for Prostate Cancer.  Screening begins at age 80 with African-American men and at age 60 with Caucasian men.  Screening may begin sooner depending on your family history.  Take these medicines  Aspirin- One aspirin daily can help prevent Heart disease and Stroke.  Flu shot- Every fall.  Tetanus- Every 10 years.  Zostavax- Once after the age of 3 to prevent Shingles.  Pneumonia shot- Once after the age of 44; if you are younger than 78, ask your healthcare provider if  you need a Pneumonia shot.  Take these steps  Don't smoke- If you do smoke, talk to your doctor about quitting.  For tips on how to quit, go to www.smokefree.gov or call 1-800-QUIT-NOW.  Be physically active- Exercise 5 days a week for at least 30 minutes.  If you are not already physically active start slow and gradually work up to 30 minutes of moderate physical activity.  Examples of moderate activity include walking briskly, mowing the yard, dancing, swimming, bicycling, etc.  Eat a healthy diet- Eat a variety of healthy food such as fruits, vegetables, low fat milk, low fat cheese, yogurt, lean meant, poultry, fish, beans, tofu, etc. For more information go to www.thenutritionsource.org  Drink alcohol in moderation- Limit alcohol intake to less than two drinks a day. Never drink and drive.  Dentist- Brush and floss twice daily; visit your dentist twice a year.  Depression- Your emotional health is as important as your physical health. If you're feeling down, or losing interest in things you would normally enjoy please talk to your healthcare provider.  Eye exam- Visit your eye doctor every year.  Safe sex- If you may be exposed to a sexually transmitted infection, use a condom.  Seat belts- Seat belts can save your life; always wear one.  Smoke/Carbon Monoxide detectors- These detectors need to be installed on the appropriate level of your home.  Replace batteries at least once a year.  Skin cancer- When out in the sun, cover up and use sunscreen 15 SPF or higher.  Violence-  If anyone is threatening you, please tell your healthcare provider.  Living Will/ Health care power of attorney- Speak with your healthcare provider and family.    IF you received an x-ray today, you will receive an invoice from [facility]. Please contact [facility] at [phone] with questions or concerns regarding your invoice.   IF you received labwork today, you will receive an  invoice from [place]. Please contact [facility] at [phone] with questions or concerns regarding your invoice.   Our billing staff will not be able to assist you with questions regarding bills from these companies.  You will be contacted with the lab results as soon as they are available. The fastest way to get your results is to activate your My Chart account. Instructions are located on the last page of this paperwork. If you have not heard from [place] regarding the results in 2 weeks, please contact this office.

## 2017-08-23 NOTE — Progress Notes (Signed)
By signing my name below, I, Schuyler Bain, attest that this documentation has been prepared under the direction and in the presence of Dr. Asencion Partridge. Neva Seat.   Electronically Signed: Marcelline Mates, Medical Scribe 08/23/2017 at 9:05 AM.  Subjective:    Patient ID: Randy Shaw, male    DOB: 1961/03/18, 57 y.o.   MRN: [id number] Chief Complaint  Patient presents with  . Annual Exam    CPE (unable to sleep well)    HPI Randy Shaw is a 57 y.o. male who presents to Primary Care at [facility] for a physical. He has a hx of Multiple Sclerosis, treated by Dr. Anne Hahn. Also has a hx of HTN, BPH, and is complaining of difficulty with sleep today.   Multiple Sclerosis: Last office visit March 8 with Dr. Anne Hahn- plan for upcoming visit in May. He takes Tecfidera 240mg  qd. Treated with Prednisone taper back in March. MRI of brain and cervical spine in March was without apparent changes from previous studies. The pt reports stable symptoms related to MS.   Hypertension:  On HCTZ 12.5mg  qd. He notes that he has no problem taking this.   Lab Results  Component Value Date   CREATININE 0.88 07/06/2017   BP Readings from Last 3 Encounters:  08/23/17 132/76  07/06/17 131/82  03/27/17 (!) 141/92   Blood Sugar:  Lab Results  Component Value Date   HGBA1C 5.6 02/20/2017   He is not fasting this morning and had a bowl of oatmeal for breakfast.   BPH: He has taken Flomax 0.4mg . He is currently being followed by a urologist and last saw Dr. Annabell Howells on 03/05/17. He is also taking rubetric.  Lab Results  Component Value Date   PSA 0.70 07/30/2013   Dysphoric Mood:  The pt notes that he sometimes struggles with borderline depression but thinks he has been doing well overall and has rebounded well.   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 He denies injuring it or damaging this finger.   Difficulty with sleep: He was evaluated in November 2018 by Dr.  Frances Furbish. Had reported difficulty with sleep since 2010. He had been using Baclofen at night. Appears he was scheduled for a home sleep test but I did not see any results. He notes that he didn't have his sleep study and that he doesn't recall why he didn't have his sleep study at this time.   Seasonal Allergies: The pt notes that he is not too bothered by his seasonal allergies at this time.   Cancer Screening: -Colonoscopy: The pt notes that he has had a colonoscopy in the last few years.  -Prostate: followed by urology.   Immunizations: Immunization History  Administered Date(s) Administered  . Influenza,inj,Quad PF,6+ Mos 12/27/2015   The pt notes that he does not recall if he has had a Tdap recently.  The pt denies having a Shingles vaccine and is open to receiving this.   Depression Screen: Depression screen [facility] 2/9 08/23/2017 02/20/2017 08/11/2016 12/27/2015 10/20/2014  Decreased Interest 0 0 0 0 0  Down, Depressed, Hopeless 0 0 0 0 0  PHQ - 2 Score 0 0 0 0 0    Vision Screen:  Visual Acuity Screening   Right eye Left eye Both eyes  Without correction: 20/25 20/30 20/25   With correction:      The pt denies using contacts or glasses and notes that he hasn't seen opthalmology or an optometrist  in a long while.   Dentist: The pt notes that he used to see Dr. Ladona Ridgel and hasn't visited his dentist in several years.    Patient Active Problem List   Diagnosis Date Noted  . 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  . Abnormality of gait 04/22/2013  . HTN (hypertension) 06/29/2012  . Multiple sclerosis (HCC) 06/29/2012  . Benign prostatic hyperplasia with urinary obstruction 06/29/2012   Past Medical History:  Diagnosis Date  . Abnormality of gait 04/22/2013  . Asthma   . BPH (benign prostatic hyperplasia)   . Hypertension   . Multiple sclerosis (HCC)   . 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   Past Surgical History:  Procedure Laterality Date  . none       Allergies  Allergen Reactions  . Peanut-Containing Drug Products Shortness Of Breath  . Zanaflex [Tizanidine Hcl] Shortness Of Breath   Prior to Admission medications   Medication Sig Start Date End Date Taking? Authorizing Provider  baclofen (LIORESAL) 20 MG tablet TAKE 1 TABLET IN THE MORNING, 1 AT NOON, AND 2 AT NIGHT 10/03/16  Yes Levert Feinstein, MD  hydrochlorothiazide (MICROZIDE) 12.5 MG capsule TAKE 1 CAPSULE(12.5 MG) BY MOUTH DAILY 02/20/17  Yes Shade Flood, MD  mirabegron ER (MYRBETRIQ) 50 MG TB24 tablet Take 50 mg by mouth daily.   Yes [provider]  predniSONE (DELTASONE) 10 MG tablet Begin taking 6 tablets daily, taper by one tablet every other day until off the medication. 07/06/17  Yes York Spaniel, MD  tamsulosin (FLOMAX) 0.4 MG CAPS capsule Take 1 capsule (0.4 mg total) by mouth daily. 07/01/14  Yes Tonye Pearson, MD  TECFIDERA 240 MG CPDR TAKE 1 CAPSULE BY MOUTH TWICE DAILY 01/15/17  Yes York Spaniel, MD   Social History   Socioeconomic History  . Marital status: Divorced    Spouse name: n/a  . Number of children: 3  . Years of education: 83  . Highest education level: Not on file  Occupational History  . Occupation: disabled/retired 1999-[place]    Comment: Event organiser  Social Needs  . Financial resource strain: Not on file  . Food insecurity:    Worry: Not on file    Inability: Not on file  . Transportation needs:    Medical: Not on file    Non-medical: Not on file  Tobacco Use  . Smoking status: Never Smoker  . Smokeless tobacco: Never Used  Substance and Sexual Activity  . Alcohol use: Yes    Alcohol/week: 0.0 oz    Comment: occasionally wine cooler  . Drug use: No  . Sexual activity: Never  Lifestyle  . Physical activity:    Days per week: Not on file    Minutes per session: Not on file  . Stress: Not on file  Relationships  . Social connections:    Talks on phone: Not on file    Gets together: Not on file     Attends religious service: Not on file    Active member of club or organization: Not on file    Attends meetings of clubs or organizations: Not on file    Relationship status: Not on file  . Intimate partner violence:    Fear of current or ex partner: Not on file    Emotionally abused: Not on file    Physically abused: Not on file    Forced sexual activity: Not on file  Other Topics Concern  . Not on file  Social  History Narrative   Lives alone, house.  3 children.  Caffeine none.  Green Tea once week.      Review of Systems  Constitutional: Negative for chills and fever.  Musculoskeletal: Positive for gait problem.  Skin: Negative for color change, rash and wound.  Allergic/Immunologic: Positive for environmental allergies.  Neurological: Positive for tremors, speech difficulty, weakness and numbness.  Psychiatric/Behavioral: Positive for confusion, decreased concentration, dysphoric mood and sleep disturbance.  Some chronic symptoms with MS as above.      Objective:   Physical Exam  Constitutional: He is oriented to person, place, and time. He appears well-developed and well-nourished. No distress.  HENT:  Head: Normocephalic and atraumatic.  Cardiovascular: Normal rate, regular rhythm and normal heart sounds. Exam reveals no gallop and no friction rub.  No murmur heard. Pulmonary/Chest: Effort normal and breath sounds normal. No respiratory distress. He has no wheezes. He has no rales.  Abdominal: Soft. There is no tenderness.  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  Skin: Skin is warm and dry. No rash noted. No erythema.  Psychiatric: He has a normal mood and affect. His behavior is normal. Judgment and thought content normal.   Vitals:   08/23/17 0833  BP: 132/76  Pulse: 86  Temp: 97.8 F (36.6 C)  TempSrc: Oral  SpO2: 97%  Weight: 219 lb (99.3 kg)  Height: 6\' 3"  (1.905 m)         Assessment & Plan:   ALBINO BUFFORD is a 57 y.o. male Annual physical exam  - -anticipatory guidance as below in AVS, screening labs above. Health maintenance items as above in HPI discussed/recommended as applicable.   Essential hypertension - Plan: Comprehensive metabolic panel, hydrochlorothiazide (MICROZIDE) 12.5 MG capsule  -Overall stable, continue same dose HCTZ, labs pending  Sleep difficulties  -Due for home sleep test, advised to contact sleep specialist to have that arranged  Multiple sclerosis (HCC)  -Followed by neurology, continue ongoing care.  Screening for hyperlipidemia - Plan: Lipid panel  Hyperglycemia - Plan: Hemoglobin A1c, Comprehensive metabolic panel  -Check A1c, CMP to screen for diabetes.  Need for shingles vaccine - Plan: Zoster Vaccine Adjuvanted [facility]) injection sent to pharmacy  Contracture of joint of right hand - Plan: Ambulatory referral to Hand Surgery  -Suspected contracture based on appearance versus boutonniere deformity without known  specific injury.  Evaluate with orthopedic hand specialist.   Meds ordered this encounter  Medications  . Zoster Vaccine Adjuvanted [facility]) injection    Sig: Inject 0.5 mLs into the muscle once for 1 dose. Repeat in 2-6 months.    Dispense:  0.5 mL    Refill:  1  . hydrochlorothiazide (MICROZIDE) 12.5 MG capsule    Sig: TAKE 1 CAPSULE(12.5 MG) BY MOUTH DAILY    Dispense:  90 capsule    Refill:  2   Patient Instructions   Please call sleep specialist to arrange for home sleep test. [phone]  Continue follow up with neurologist and urologist as planned.   No change in blood pressure medication for now.  I will check some lab work today, but if cholesterol is elevated, may need to repeat after 8 hours of fasting.  He had a possible contracture of the right hand.  I will refer you to hand specialist to discuss that further.  If any symptoms of depression, please return to discuss the  symptoms further and possible treatments.  Thank you for coming in today.  Recheck in 6 months  Keeping you healthy  Get these tests  Blood pressure- Have your blood pressure checked once a year by your healthcare provider.  Normal blood pressure is 120/80  Weight- Have your body mass index (BMI) calculated to screen for obesity.  BMI is a measure of body fat based on height and weight. You can also calculate your own BMI at [email or web address].  Cholesterol- Have your cholesterol checked every year.  Diabetes- Have your blood sugar checked regularly if you have high blood pressure, high cholesterol, have a family history of diabetes or if you are overweight.  Screening for Colon Cancer- Colonoscopy starting at age 49.  Screening may begin sooner depending on your family history and other health conditions. Follow up colonoscopy as directed by your Gastroenterologist.  Screening for Prostate Cancer- Both blood work (PSA) and a rectal exam help screen for Prostate Cancer.  Screening begins at age 81 with African-American men and at age 63 with Caucasian men.  Screening may begin sooner depending on your family history.  Take these medicines  Aspirin- One aspirin daily can help prevent Heart disease and Stroke.  Flu shot- Every fall.  Tetanus- Every 10 years.  Zostavax- Once after the age of 3 to prevent Shingles.  Pneumonia shot- Once after the age of 91; if you are younger than 62, ask your healthcare provider if you need a Pneumonia shot.  Take these steps  Don't smoke- If you do smoke, talk to your doctor about quitting.  For tips on how to quit, go to www.smokefree.gov or call 1-800-QUIT-NOW.  Be physically active- Exercise 5 days a week for at least 30 minutes.  If you are not already physically active start slow and gradually work up to 30 minutes of moderate physical activity.  Examples of moderate activity include walking briskly, mowing the yard, dancing, swimming,  bicycling, etc.  Eat a healthy diet- Eat a variety of healthy food such as fruits, vegetables, low fat milk, low fat cheese, yogurt, lean meant, poultry, fish, beans, tofu, etc. For more information go to www.thenutritionsource.org  Drink alcohol in moderation- Limit alcohol intake to less than two drinks a day. Never drink and drive.  Dentist- Brush and floss twice daily; visit your dentist twice a year.  Depression- Your emotional health is as important as your physical health. If you're feeling down, or losing interest in things you would normally enjoy please talk to your healthcare provider.  Eye exam- Visit your eye doctor every year.  Safe sex- If you may be exposed to a sexually transmitted infection, use a condom.  Seat belts- Seat belts can save your life; always wear one.  Smoke/Carbon Monoxide detectors- These detectors need to be installed on the appropriate level of your home.  Replace batteries at least once a year.  Skin cancer- When out in the sun, cover up and use sunscreen 15 SPF or higher.  Violence- If anyone is threatening you, please tell your healthcare provider.  Living Will/ Health care power of attorney- Speak with your healthcare provider and family.    IF you received an x-ray today, you will receive an invoice from [facility]. Please contact [facility] at [phone] with questions or concerns regarding your invoice.   IF you received labwork today, you will receive an invoice from [place]. Please contact [facility] at [phone] with questions or concerns regarding your invoice.   Our billing staff will not be able to assist  you with questions regarding bills from these companies.  You will be contacted with the lab results as soon as they are available. The fastest way to get your results is to activate your My Chart account. Instructions are located on the last page of this paperwork. If you have not heard from [place] regarding the  results in 2 weeks, please contact this office.       I personally performed the services described in this documentation, which was scribed in my presence. The recorded information has been reviewed and considered for accuracy and completeness, addended by me as needed, and agree with information above.  Signed,   Meredith Staggers, MD Primary Care at [facility].  08/25/17 6:06 PM

## 2017-08-24 LAB — COMPREHENSIVE METABOLIC PANEL
ALBUMIN: 4.4 g/dL (ref 3.5–5.5)
ALT: 17 IU/L (ref 0–44)
AST: 17 IU/L (ref 0–40)
Albumin/Globulin Ratio: 1.7 (ref 1.2–2.2)
Alkaline Phosphatase: 73 IU/L (ref 39–117)
BUN / CREAT RATIO: 19 (ref 9–20)
BUN: 16 mg/dL (ref 6–24)
Bilirubin Total: 0.3 mg/dL (ref 0.0–1.2)
CALCIUM: 9.4 mg/dL (ref 8.7–10.2)
CO2: 25 mmol/L (ref 20–29)
CREATININE: 0.85 mg/dL (ref 0.76–1.27)
Chloride: 107 mmol/L — ABNORMAL HIGH (ref 96–106)
GFR, EST AFRICAN AMERICAN: 112 mL/min/{1.73_m2} (ref >59)
GFR, EST NON AFRICAN AMERICAN: 97 mL/min/{1.73_m2} (ref >59)
GLUCOSE: 103 mg/dL — AB (ref 65–99)
Globulin, Total: 2.6 g/dL (ref 1.5–4.5)
Potassium: 3.9 mmol/L (ref 3.5–5.2)
Sodium: 147 mmol/L — ABNORMAL HIGH (ref 134–144)
TOTAL PROTEIN: 7 g/dL (ref 6.0–8.5)

## 2017-08-24 LAB — LIPID PANEL
Chol/HDL Ratio: 3.2 ratio (ref 0.0–5.0)
Cholesterol, Total: 210 mg/dL — ABNORMAL HIGH (ref 100–199)
HDL: 65 mg/dL (ref >39)
LDL Calculated: 128 mg/dL — ABNORMAL HIGH (ref 0–99)
Triglycerides: 86 mg/dL (ref 0–149)
VLDL Cholesterol Cal: 17 mg/dL (ref 5–40)

## 2017-08-24 LAB — HEMOGLOBIN A1C
ESTIMATED AVERAGE GLUCOSE: 114 mg/dL
Hgb A1c MFr Bld: 5.6 % (ref 4.8–5.6)

## 2017-09-13 ENCOUNTER — Encounter: Payer: Self-pay | Admitting: Family Medicine

## 2017-09-17 ENCOUNTER — Other Ambulatory Visit: Payer: Self-pay | Admitting: Neurology

## 2017-09-25 ENCOUNTER — Ambulatory Visit (INDEPENDENT_AMBULATORY_CARE_PROVIDER_SITE_OTHER): Payer: BLUE CROSS/BLUE SHIELD | Admitting: Adult Health

## 2017-09-25 ENCOUNTER — Encounter: Payer: Self-pay | Admitting: Adult Health

## 2017-09-25 VITALS — BP 140/88 | HR 70 | Ht 75.0 in

## 2017-09-25 DIAGNOSIS — M21372 Foot drop, left foot: Secondary | ICD-10-CM | POA: Diagnosis not present

## 2017-09-25 DIAGNOSIS — G35 Multiple sclerosis: Secondary | ICD-10-CM | POA: Diagnosis not present

## 2017-09-25 NOTE — Progress Notes (Signed)
I have read the note, and I agree with the clinical assessment and plan.  Kacper K Satish Hammers   

## 2017-09-25 NOTE — Progress Notes (Signed)
PATIENT: Randy Shaw DOB: [birth year]  REASON FOR VISIT: follow up HISTORY FROM: patient  HISTORY OF PRESENT ILLNESS: Today 09/25/17 Randy Shaw is a 57 year old male with a history of multiple sclerosis associated with spastic quadriparesis.  He returns today for follow-up.  He was last seen in March with Dr. Anne Hahn.  A repeat MRI was ordered that was relatively unremarkable.  It did not show any new lesions from his previous scans. The patient reports that he has been doing fairly well.  Reports that the left foot drop seems to be more prominent.  He uses a motorized wheelchair primarily but he is able to stand for transfers and ambulate small distances.  He denies any significant changes with bowel or bladder.  No changes with his vision.  No new numbness or weakness.  He does feel that the left-sided weakness is a little more prominent than before.  He returns today for evaluation.  HISTORY 07/06/17: Randy Shaw is a 57 year old right-handed black male with a history of multiple sclerosis associated with a spastic quadriparesis.  The patient has multiple brain and spinal cord lesions, black holes are noted on prior MRI that was done in December 2017.  The patient comes in today on an urgent basis for evaluation of new symptoms that began at least 2 weeks ago.  The patient has noted that he has had some cognitive changes, he is having difficulty processing information, he seems to be confused.  He denies any overt fatigue associated with the symptoms.  He believes that he is having some increased blurred vision and double vision.  He has not noted definite changes in bowel or bladder function or any new numbness or new weakness of the face, arms, or legs.  He has not lost vision.  The patient lives alone.  He does not believe that there has been any mistakes made with his medications, he does have a pill dispenser.  He denies any new medications that have been added to the regimen.  He denies  headaches or dizziness.  He did fall within the last month or 2, and he did bump his head.  He comes in today for further medical evaluation.  He denies fevers or chills or night sweats.   REVIEW OF SYSTEMS: Out of a complete 14 system review of symptoms, the patient complains only of the following symptoms, and all other reviewed systems are negative.  See HPI  ALLERGIES: Allergies  Allergen Reactions  . Peanut-Containing Drug Products Shortness Of Breath  . Zanaflex [Tizanidine Hcl] Shortness Of Breath    HOME MEDICATIONS: Outpatient Medications Prior to Visit  Medication Sig Dispense Refill  . baclofen (LIORESAL) 20 MG tablet TAKE 1 TABLET IN THE MORNING, 1 AT NOON, AND 2 AT NIGHT 120 tablet 11  . hydrochlorothiazide (MICROZIDE) 12.5 MG capsule TAKE 1 CAPSULE(12.5 MG) BY MOUTH DAILY 90 capsule 2  . predniSONE (DELTASONE) 10 MG tablet Begin taking 6 tablets daily, taper by one tablet every other day until off the medication. 42 tablet 0  . tamsulosin (FLOMAX) 0.4 MG CAPS capsule Take 1 capsule (0.4 mg total) by mouth daily. 30 capsule 0  . TECFIDERA 240 MG CPDR TAKE 1 CAPSULE BY MOUTH TWICE DAILY 60 capsule 11   Facility-Administered Medications Prior to Visit  Medication Dose Route Frequency Provider Last Rate Last Dose  . gadopentetate dimeglumine (MAGNEVIST) injection 20 mL  20 mL Intravenous Once PRN York Spaniel, MD  PAST MEDICAL HISTORY: Past Medical History:  Diagnosis Date  . Abnormality of gait 04/22/2013  . Asthma   . BPH (benign prostatic hyperplasia)   . Hypertension   . Multiple sclerosis (HCC)   . 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    PAST SURGICAL HISTORY: Past Surgical History:  Procedure Laterality Date  . none      FAMILY HISTORY: Family History  Problem Relation Age of Onset  . Cancer Mother        breast  . Stroke Father   . Cancer - Colon Father   . Multiple sclerosis Neg Hx     SOCIAL HISTORY: Social History    Socioeconomic History  . Marital status: Divorced    Spouse name: n/a  . Number of children: 3  . Years of education: 54  . Highest education level: Not on file  Occupational History  . Occupation: disabled/retired 1999-[place]    Comment: Event organiser  Social Needs  . Financial resource strain: Not on file  . Food insecurity:    Worry: Not on file    Inability: Not on file  . Transportation needs:    Medical: Not on file    Non-medical: Not on file  Tobacco Use  . Smoking status: Never Smoker  . Smokeless tobacco: Never Used  Substance and Sexual Activity  . Alcohol use: Yes    Alcohol/week: 0.0 oz    Comment: occasionally wine cooler  . Drug use: No  . Sexual activity: Never  Lifestyle  . Physical activity:    Days per week: Not on file    Minutes per session: Not on file  . Stress: Not on file  Relationships  . Social connections:    Talks on phone: Not on file    Gets together: Not on file    Attends religious service: Not on file    Active member of club or organization: Not on file    Attends meetings of clubs or organizations: Not on file    Relationship status: Not on file  . Intimate partner violence:    Fear of current or ex partner: Not on file    Emotionally abused: Not on file    Physically abused: Not on file    Forced sexual activity: Not on file  Other Topics Concern  . Not on file  Social History Narrative   Lives alone, house.  3 children.  Caffeine none.  Green Tea once week.        PHYSICAL EXAM  Vitals:   09/25/17 1024  BP: 140/88  Pulse: 70  Height: 6\' 3"  (1.905 m)   Body mass index is 27.37 kg/m.  Generalized: Well developed, in no acute distress   Neurological examination  Mentation: Alert oriented to time, place, history taking. Follows all commands speech and language fluent Cranial nerve II-XII: Pupils were equal round reactive to light. Extraocular movements were full, visual field were full on confrontational  test. Facial sensation and strength were normal. Uvula tongue midline. Head turning and shoulder shrug  were normal and symmetric. Motor: 4/5 strength in LUE and RUE.  Increased tone noted on the left side.  Patient is unable to flex the left leg.  Foot drop noted on the left. Sensory: Sensory testing is intact to soft touch on all 4 extremities. No evidence of extinction is noted.  Coordination: Cerebellar testing reveals good finger-nose-finger bilaterally and good heel-to-shin on the right unable to do on the left due to mobility. Gait  and station: Patient is in a motorized wheelchair. Reflexes: Deep tendon reflexes are symmetric and normal bilaterally.   DIAGNOSTIC DATA (LABS, IMAGING, TESTING) - I reviewed patient records, labs, notes, testing and imaging myself where available.  Lab Results  Component Value Date   WBC 5.5 07/06/2017   HGB 14.3 07/06/2017   HCT 43.6 07/06/2017   MCV 83 07/06/2017   PLT 229 07/06/2017      Component Value Date/Time   NA 147 (H) 08/23/2017 1017   K 3.9 08/23/2017 1017   CL 107 (H) 08/23/2017 1017   CO2 25 08/23/2017 1017   GLUCOSE 103 (H) 08/23/2017 1017   GLUCOSE 96 07/30/2013 0952   BUN 16 08/23/2017 1017   CREATININE 0.85 08/23/2017 1017   CREATININE 0.89 07/30/2013 0952   CALCIUM 9.4 08/23/2017 1017   PROT 7.0 08/23/2017 1017   ALBUMIN 4.4 08/23/2017 1017   AST 17 08/23/2017 1017   ALT 17 08/23/2017 1017   ALKPHOS 73 08/23/2017 1017   BILITOT 0.3 08/23/2017 1017   GFRNONAA 97 08/23/2017 1017   GFRNONAA >89 07/30/2013 0952   GFRAA 112 08/23/2017 1017   GFRAA >89 07/30/2013 0952   Lab Results  Component Value Date   CHOL 210 (H) 08/23/2017   HDL 65 08/23/2017   LDLCALC 128 (H) 08/23/2017   TRIG 86 08/23/2017   CHOLHDL 3.2 08/23/2017   Lab Results  Component Value Date   HGBA1C 5.6 08/23/2017   Lab Results  Component Value Date   VITAMINB12 343 07/06/2017   Lab Results  Component Value Date   TSH 3.570 07/06/2017       ASSESSMENT AND PLAN 57 y.o. year old male  has a past medical history of Abnormality of gait (04/22/2013), Asthma, BPH (benign prostatic hyperplasia), Hypertension, Multiple sclerosis (HCC), and Quadriplegia and quadriparesis (HCC) (03/08/2016). here with :  1.  Multiple sclerosis 2.  Left foot drop  Overall the patient is doing well.  He will continue on Tecfidera.  He had blood work at the last visit that was relatively unremarkable.  I will order physical therapy as well as an AFO brace.  He is advised that if his symptoms worsen or he develops new symptoms he should let us know.  He will follow-up in 6 months or sooner if needed.   I spent 15 minutes with the patient. 50% of this time was spent discussing his symptoms   Butch Penny, MSN, NP-C 09/25/2017, 10:31 AM [facility] [address] [place], [place] [address] [phone]

## 2017-09-25 NOTE — Patient Instructions (Addendum)
Your Plan:  Continue Tecfidera PT ordered- as well as AFO brace  Thank you for coming to see [place] at [facility]. I hope we have been able to provide you high quality care today.  You may receive a patient satisfaction survey over the next few weeks. We would appreciate your feedback and comments so that we may continue to improve ourselves and the health of our patients.

## 2017-09-26 ENCOUNTER — Ambulatory Visit (INDEPENDENT_AMBULATORY_CARE_PROVIDER_SITE_OTHER): Payer: BLUE CROSS/BLUE SHIELD | Admitting: Neurology

## 2017-09-26 DIAGNOSIS — G4733 Obstructive sleep apnea (adult) (pediatric): Secondary | ICD-10-CM | POA: Diagnosis not present

## 2017-09-26 DIAGNOSIS — G479 Sleep disorder, unspecified: Secondary | ICD-10-CM

## 2017-10-01 ENCOUNTER — Other Ambulatory Visit: Payer: Self-pay | Admitting: Neurology

## 2017-10-04 DIAGNOSIS — M79644 Pain in right finger(s): Secondary | ICD-10-CM | POA: Diagnosis not present

## 2017-10-04 DIAGNOSIS — M24541 Contracture, right hand: Secondary | ICD-10-CM | POA: Diagnosis not present

## 2017-10-05 NOTE — Procedures (Signed)
[facility] [address] [place], [place] [address] NAME:   Randy Shaw                                                               DOB: August 21, 1960 MEDICAL RECORD NUMBER [id number]                                               DOS:  09/25/17 REFERRING PHYSICIAN: Meredith Staggers, MD STUDY PERFORMED: Home Sleep Test HISTORY: 57 year old man with a history of multiple sclerosis with associated spastic quadriparesis, hypertension, BPH, and overweight state, who reports snoring and excessive daytime somnolence, and a family history of sleep apnea. His Epworth sleepiness score is 7 out of 24 today, BMI: 36.3.   STUDY RESULTS:  Total Recording Time:  8 hours, 23 minutes (flow eval time: 1 h, 19 min) Total Apnea/Hypopnea Index (AHI):   5.3/h, RDI:   11.8/h Average Oxygen Saturation:   92%, Lowest Oxygen saturation: 82%  Total Time Oxygen Saturation Below or at 88%:  76 minutes (18%)  Average Heart Rate: 84 bpm (between 66 and 192 bpm).  IMPRESSION: OSA RECOMMENDATION: This home sleep test demonstrates overall mild obstructive sleep apnea with a total AHI of 5.3/hour and O2 nadir of 82%. Given the patient's medical history and sleep related complaints, treatment with positive airway pressure is recommended. This can be achieved in the form of autoPAP trial/titration at home. A full night CPAP titration study will help with proper treatment settings and mask fitting, if needed. Alternative treatments include weight loss along with avoidance of the supine sleep position, or an oral appliance in appropriate candidates. Please note that untreated obstructive sleep apnea may carry additional perioperative morbidity. Patients with significant obstructive sleep apnea should receive perioperative PAP therapy and the surgeons and particularly the anesthesiologist should be informed of the diagnosis and the severity of the sleep disordered breathing. The patient should be cautioned not  to drive, work at heights, or operate dangerous or heavy equipment when tired or sleepy. Review and reiteration of good sleep hygiene measures should be pursued with any patient. Other causes of the patient's symptoms, including circadian rhythm disturbances, an underlying mood disorder, medication effect and/or an underlying medical problem cannot be ruled out based on this test. Clinical correlation is recommended. The patient and his referring provider will be notified of the test results. The patient will be seen in follow up in sleep clinic at [facility].  I certify that I have reviewed the raw data recording prior to the issuance of this report in accordance with the standards of the American Academy of Sleep Medicine (AASM).  Huston Foley, MD, PhD [facility]) Diplomat, ABPN (Neurology and Sleep)

## 2017-10-05 NOTE — Addendum Note (Signed)
Addended by: Huston Foley on: 10/05/2017 12:57 PM   Modules accepted: Orders

## 2017-10-05 NOTE — Progress Notes (Signed)
Patient referred by Dr. Neva Seat, seen by me on 03/27/17, HST on 09/25/17.    Please call and notify the patient that the recent home sleep test showed obstructive sleep apnea. OSA is overall mild, but worth treating to see if he feels better after treatment. To that end I recommend treatment for this in the form of autoPAP, which means, that we don't have to bring him in for a sleep study with CPAP, but will let him try an autoPAP machine at home, through a DME company (of his choice, or as per insurance requirement). The DME representative will educate him on how to use the machine, how to put the mask on, etc. I have placed an order in the chart. Please send referral, talk to patient, send report to referring MD. We will need a FU in sleep clinic for 10 weeks post-PAP set up, please arrange that with me or one of our NPs. Thanks,   Huston Foley, MD, PhD [facility])

## 2017-10-08 ENCOUNTER — Telehealth: Payer: Self-pay

## 2017-10-08 NOTE — Telephone Encounter (Signed)
-----   Message from Huston Foley, MD sent at 10/05/2017 12:57 PM EDT ----- Patient referred by Dr. Neva Seat, seen by me on 03/27/17, HST on 09/25/17.    Please call and notify the patient that the recent home sleep test showed obstructive sleep apnea. OSA is overall mild, but worth treating to see if he feels better after treatment. To that end I recommend treatment for this in the form of autoPAP, which means, that we don't have to bring him in for a sleep study with CPAP, but will let him try an autoPAP machine at home, through a DME company (of his choice, or as per insurance requirement). The DME representative will educate him on how to use the machine, how to put the mask on, etc. I have placed an order in the chart. Please send referral, talk to patient, send report to referring MD. We will need a FU in sleep clinic for 10 weeks post-PAP set up, please arrange that with me or one of our NPs. Thanks,   Huston Foley, MD, PhD [facility])

## 2017-10-08 NOTE — Telephone Encounter (Signed)
Pt has returned the call to RN Kristen, he is asking for a call back °

## 2017-10-08 NOTE — Telephone Encounter (Signed)
I called pt to discuss. No answer, left a message asking him to call me back. 

## 2017-10-08 NOTE — Telephone Encounter (Signed)
I called pt. I advised pt that Dr. Frances Furbish reviewed their sleep study results and found that pt has mild osa. Dr. Frances Furbish recommends that pt start an auto pap at home. I reviewed PAP compliance expectations with the pt. Pt is agreeable to starting an auto-PAP. I advised pt that an order will be sent to a DME, [facility], and [facility] will call the pt within about one week after they file with the pt's insurance. [facility] will show the pt how to use the machine, fit for masks, and troubleshoot the auto-PAP if needed. A follow up appt was made for insurance purposes with Dr. Frances Furbish on 01/15/18 at 11:30am. Pt verbalized understanding to arrive 15 minutes early and bring their auto-PAP. A letter with all of this information in it will be sent to the pt's mychart as a reminder. I verified with the pt that the address we have on file is correct. Pt verbalized understanding of results. Pt had no questions at this time but was encouraged to call back if questions arise.

## 2017-10-16 ENCOUNTER — Other Ambulatory Visit: Payer: Self-pay | Admitting: Family Medicine

## 2017-10-16 DIAGNOSIS — I10 Essential (primary) hypertension: Secondary | ICD-10-CM

## 2017-10-19 DIAGNOSIS — G4733 Obstructive sleep apnea (adult) (pediatric): Secondary | ICD-10-CM | POA: Diagnosis not present

## 2017-10-19 DIAGNOSIS — G35 Multiple sclerosis: Secondary | ICD-10-CM | POA: Diagnosis not present

## 2017-11-08 ENCOUNTER — Telehealth: Payer: Self-pay | Admitting: *Deleted

## 2017-11-08 NOTE — Telephone Encounter (Signed)
Gave completed/signed [employer] form back to medical records to process for pt.

## 2017-11-18 ENCOUNTER — Encounter: Payer: Self-pay | Admitting: Neurology

## 2017-11-18 DIAGNOSIS — G4733 Obstructive sleep apnea (adult) (pediatric): Secondary | ICD-10-CM | POA: Diagnosis not present

## 2017-11-22 DIAGNOSIS — G4733 Obstructive sleep apnea (adult) (pediatric): Secondary | ICD-10-CM | POA: Diagnosis not present

## 2017-11-22 DIAGNOSIS — G35 Multiple sclerosis: Secondary | ICD-10-CM | POA: Diagnosis not present

## 2017-12-19 DIAGNOSIS — G4733 Obstructive sleep apnea (adult) (pediatric): Secondary | ICD-10-CM | POA: Diagnosis not present

## 2018-01-04 ENCOUNTER — Encounter: Payer: Self-pay | Admitting: Neurology

## 2018-01-10 ENCOUNTER — Ambulatory Visit (INDEPENDENT_AMBULATORY_CARE_PROVIDER_SITE_OTHER): Payer: BLUE CROSS/BLUE SHIELD | Admitting: Neurology

## 2018-01-10 ENCOUNTER — Encounter

## 2018-01-10 ENCOUNTER — Encounter: Payer: Self-pay | Admitting: Neurology

## 2018-01-10 VITALS — BP 141/90 | HR 81 | Ht 75.0 in

## 2018-01-10 DIAGNOSIS — R269 Unspecified abnormalities of gait and mobility: Secondary | ICD-10-CM | POA: Diagnosis not present

## 2018-01-10 DIAGNOSIS — G35 Multiple sclerosis: Secondary | ICD-10-CM | POA: Diagnosis not present

## 2018-01-10 DIAGNOSIS — Z5181 Encounter for therapeutic drug level monitoring: Secondary | ICD-10-CM | POA: Diagnosis not present

## 2018-01-10 MED ORDER — MODAFINIL 200 MG PO TABS
200.0000 mg | ORAL_TABLET | Freq: Every day | ORAL | 3 refills | Status: DC
Start: 2018-01-10 — End: 2018-01-14

## 2018-01-10 MED ORDER — BACLOFEN 20 MG PO TABS: ORAL_TABLET | ORAL | 11 refills | Status: DC

## 2018-01-10 MED ORDER — DIMETHYL FUMARATE 240 MG PO CPDR: 1.0000 | DELAYED_RELEASE_CAPSULE | Freq: Two times a day (BID) | ORAL | 11 refills | Status: DC

## 2018-01-10 NOTE — Progress Notes (Signed)
Reason for visit: Multiple sclerosis  Randy Shaw is an 57 y.o. male  History of present illness:  Randy Shaw is a 57 year old right-handed black male with a history of multiple sclerosis associated with a quadriparesis.  The patient has left greater than right leg weakness, he uses a motorized wheelchair for mobility.  The patient is on Tecfidera, he tolerates the medication fairly well.  He has had a recent MRI evaluation done within the last 6 months that showed good stability from 2017.  He continues to complain of difficulty with cognitive processing, he has been diagnosed with sleep apnea on CPAP, he has had difficulty tolerating the CPAP.  He still does not sleep well at night.  He has not had any new numbness or weakness of the extremities, he denies any changes in bowel or bladder control.  He returns to this office for an evaluation.   Past Medical History:  Diagnosis Date  . Abnormality of gait 04/22/2013  . Asthma   . BPH (benign prostatic hyperplasia)   . Hypertension   . Multiple sclerosis (HCC)   . 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 none      Family History  Problem Relation Age of Onset  . Cancer Mother        breast  . Stroke Father   . Cancer - Colon Father   . Multiple sclerosis Neg Hx     Social history:  reports that he has never smoked. He has never used smokeless tobacco. He reports that he drinks alcohol. He reports that he does not use drugs.    Allergies  Allergen Reactions  . Peanut-Containing Drug Products Shortness Of Breath  . Zanaflex [Tizanidine Hcl] Shortness Of Breath    Medications:  Prior to Admission medications   Medication Sig Start Date End Date Taking? Authorizing Provider  baclofen (LIORESAL) 20 MG tablet TAKE 1 TABLET IN THE MORNING, 1 AT NOON, AND 2 AT NIGHT 10/01/17  Yes Levert Feinstein, MD  hydrochlorothiazide (MICROZIDE) 12.5 MG capsule TAKE 1 CAPSULE(12.5  MG) BY MOUTH DAILY 08/23/17  Yes Shade Flood, MD  tamsulosin (FLOMAX) 0.4 MG CAPS capsule Take 1 capsule (0.4 mg total) by mouth daily. 07/01/14  Yes Tonye Pearson, MD  TECFIDERA 240 MG CPDR TAKE 1 CAPSULE BY MOUTH TWICE DAILY 01/15/17  Yes York Spaniel, MD    ROS:  Out of a complete 14 system review of symptoms, the patient complains only of the following symptoms, and all other reviewed systems are negative.  Double vision, blurred vision Insomnia, snoring Speech difficulty, tremors  Blood pressure (!) 141/90, pulse 81, height 6\' 3"  (1.905 m), SpO2 98 %.  Physical Exam  General: The patient is alert and cooperative at the time of the examination.  Skin: No significant peripheral edema is noted.   Neurologic Exam  Mental status: The patient is alert and oriented x 3 at the time of the examination. The patient has apparent normal recent and remote memory, with an apparently normal attention span and concentration ability.  Mini-Mental status examination done today shows a total score 27/30.   Cranial nerves: Facial symmetry is present. Speech is normal, no aphasia or dysarthria is noted. Extraocular movements are full, with exception of incomplete adduction of the left eye with looking to the right.  The patient has a left INO. Visual fields are full.  Pupils are equal, round, and reactive to  light.  Discs are flat bilaterally.  Motor: The patient has good strength in all 4 extremities, with exception that the left leg has 4/5 strength with hip flexion and knee extension, difficulty with dorsiflexion, increased motor tone is noted on both lower extremities, left greater than right.  Sensory examination: Soft touch sensation is symmetric on the face, arms, and legs.  Coordination: The patient has slight dysmetria with finger-nose-finger bilaterally, the patient cannot perform heel shin with a left leg, can perform with the right.  Gait and station: The patient could not  ambulate, he is in a motorized wheelchair.  Reflexes: Deep tendon reflexes are symmetric.    MRI brain 07/18/17:  IMPRESSION:   Abnormal MRI brain (with and without) demonstrating: 1. Multiple round and ovoid, periventricular, subcortical, juxtacortical, left pontine and left middle cerebellar peduncle chronic demyelinating plaques.  2. No abnormal lesions are seen on post contrast views.  3. No change from MRI on 03/31/16.    MRI cervical 07/18/17:  IMPRESSION:   Abnormal MRI cervical spine (with and without) demonstrating: 1. Hazy ill-defined T2 hyperintensities throughout the cervical spinal cord (C2-C7), consistent with chronic demyelinating disease. 2. No acute demyelinating plaques. 3. No significant change from MRI on 03/31/16.   Assessment/Plan:  1.  Multiple sclerosis  2.  Quadriparesis, gait disorder  3.  Sleep apnea on CPAP  The patient reports ongoing problems with cognitive processing, he has not given up any activities of daily living because of cognitive issues.  The patient will be placed on Provigil.  He will continue on Tecfidera, blood work will be done today.  A prescription was sent in for the baclofen and for the Tecfidera.   Marlan Palau MD 01/10/2018 3:58 PM  [facility] [address] [place], [place] [address]  Phone [phone] Fax [phone]

## 2018-01-11 ENCOUNTER — Telehealth: Payer: Self-pay | Admitting: *Deleted

## 2018-01-11 LAB — CBC WITH DIFFERENTIAL/PLATELET
BASOS ABS: 0 10*3/uL (ref 0.0–0.2)
Basos: 1 %
EOS (ABSOLUTE): 0.2 10*3/uL (ref 0.0–0.4)
Eos: 3 %
Hematocrit: 42.4 % (ref 37.5–51.0)
Hemoglobin: 14 g/dL (ref 13.0–17.7)
IMMATURE GRANULOCYTES: 0 %
Immature Grans (Abs): 0 10*3/uL (ref 0.0–0.1)
LYMPHS ABS: 1.7 10*3/uL (ref 0.7–3.1)
Lymphs: 27 %
MCH: 26.9 pg (ref 26.6–33.0)
MCHC: 33 g/dL (ref 31.5–35.7)
MCV: 81 fL (ref 79–97)
MONOCYTES: 6 %
Monocytes Absolute: 0.4 10*3/uL (ref 0.1–0.9)
NEUTROS PCT: 63 %
Neutrophils Absolute: 3.7 10*3/uL (ref 1.4–7.0)
Platelets: 234 10*3/uL (ref 150–450)
RBC: 5.21 x10E6/uL (ref 4.14–5.80)
RDW: 12.6 % (ref 12.3–15.4)
WBC: 6 10*3/uL (ref 3.4–10.8)

## 2018-01-11 LAB — COMPREHENSIVE METABOLIC PANEL
ALK PHOS: 71 IU/L (ref 39–117)
ALT: 14 IU/L (ref 0–44)
AST: 14 IU/L (ref 0–40)
Albumin/Globulin Ratio: 1.7 (ref 1.2–2.2)
Albumin: 4.5 g/dL (ref 3.5–5.5)
BUN / CREAT RATIO: 16 (ref 9–20)
BUN: 15 mg/dL (ref 6–24)
Bilirubin Total: 0.4 mg/dL (ref 0.0–1.2)
CHLORIDE: 102 mmol/L (ref 96–106)
CO2: 26 mmol/L (ref 20–29)
Calcium: 9.6 mg/dL (ref 8.7–10.2)
Creatinine, Ser: 0.94 mg/dL (ref 0.76–1.27)
GFR calc Af Amer: 103 mL/min/{1.73_m2} (ref >59)
GFR calc non Af Amer: 89 mL/min/{1.73_m2} (ref >59)
GLUCOSE: 109 mg/dL — AB (ref 65–99)
Globulin, Total: 2.7 g/dL (ref 1.5–4.5)
Potassium: 4.2 mmol/L (ref 3.5–5.2)
Sodium: 145 mmol/L — ABNORMAL HIGH (ref 134–144)
Total Protein: 7.2 g/dL (ref 6.0–8.5)

## 2018-01-11 NOTE — Telephone Encounter (Signed)
Initaited [place] modafinil on covermymeds. In process of completing. Key: [id number].

## 2018-01-11 NOTE — Telephone Encounter (Signed)
Submitted PA. Waiting on determination.  "Your information has been submitted to [employer]. [employer] [place] will review the request and fax you a determination directly, typically within 3 business days of your submission once all necessary information is received. If [employer] [place] has not responded in 3 business days or if you have any questions about your submission, contact [employer]  at [phone]." 

## 2018-01-14 MED ORDER — ARMODAFINIL 150 MG PO TABS: 150.0000 mg | ORAL_TABLET | Freq: Every day | ORAL | 3 refills | Status: DC

## 2018-01-14 NOTE — Addendum Note (Signed)
Addended by: York Spaniel on: 01/14/2018 08:29 AM   Modules accepted: Orders

## 2018-01-14 NOTE — Telephone Encounter (Signed)
Dr. Anne Hahn- please advise Pa modafinil denied because patient must try/fail generic armodafinil first. Will send to Dr. Anne Hahn to see if he would like to change rx.

## 2018-01-14 NOTE — Telephone Encounter (Signed)
Okay with me to try Nuvigil.

## 2018-01-15 ENCOUNTER — Encounter: Payer: Self-pay | Admitting: Neurology

## 2018-01-15 ENCOUNTER — Ambulatory Visit (INDEPENDENT_AMBULATORY_CARE_PROVIDER_SITE_OTHER): Payer: BLUE CROSS/BLUE SHIELD | Admitting: Neurology

## 2018-01-15 VITALS — BP 135/93 | HR 72

## 2018-01-15 DIAGNOSIS — G4731 Primary central sleep apnea: Secondary | ICD-10-CM

## 2018-01-15 DIAGNOSIS — G4739 Other sleep apnea: Secondary | ICD-10-CM

## 2018-01-15 DIAGNOSIS — Z789 Other specified health status: Secondary | ICD-10-CM | POA: Diagnosis not present

## 2018-01-15 NOTE — Progress Notes (Signed)
Subjective:    Patient ID: Randy Shaw is a 57 y.o. male.  HPI     Interim history:   Randy Shaw is a 57 year old right-handed gentleman with an underlying medical history of multiple sclerosis with associated spastic quadriparesis, followed by Dr. Jannifer Shaw, hypertension, BPH, and overweight state, who presents for follow-up consultation of his obstructive sleep apnea after home sleep testing and starting AutoPap therapy. The patient is unaccompanied today. I first met him on 03/27/2017 at the request of his primary care physician, at which time he reported snoring and daytime somnolence as well as a family history of OSA. We proceeded with a home sleep test. His home sleep testing from 09/25/2017 showed limited results with a recording time of 8 hours and 23 minutes but flow evaluation time of 1 hour and 19 minutes, total AHI was borderline at 5.3 per hour, average oxygen saturation 92%, nadir of 82% with time below or at 88% saturation of 76 minutes. Given his medical history of sleep related complaints he was advised to start AutoPap therapy.  Today, 01/15/2018: I reviewed his AutoPap compliance data from 12/15/2017 through 01/13/2018 which is a total of 30 days, during which time he used his AutoPap every night with percent used days greater than 4 hours at 97%, indicating excellent compliance with an average usage of 7 hours and 23 minutes, residual AHI suboptimal at 7.3 per hour with mostly central events at 3.8 per hour, average pressure for the 95th percentile at 12.1 cm, range of 5 cm to 13 cm, leak rather high with the 95th percentile at 63.2 L/m. He reports struggling with tolerance of his AutoPap. He has felt no improvement. He is having a difficult time tolerating the mask and the pressure.  The patient's allergies, current medications, family history, past medical history, past social history, past surgical history and problem list were reviewed and updated as appropriate.    Previously:   03/27/2017: (He) reports snoring and excessive daytime somnolence, sleep disruption. He has nonrestorative sleep. His brother has sleep apnea and uses a CPAP machine. He lives alone. He used to be a Engineer, structural. He has 3 grown children. He has been divorced since 2010. He reports that he has not been sleeping well since 2010. He has tried over-the-counter sleep aids which helped but make him groggy the next day. Baclofen at night helps him fall asleep. He does not stay asleep well. He does not have night to night nocturia, uses a urinal typically. He is weaker on the left side. He uses a motorized wheelchair. He is a nonsmoker. He drinks caffeine very little. Bedtime is around midnight and wake up time around 8. He twitches sometimes at night but denies restless leg symptoms. I reviewed your office note from 02/20/2017. His Epworth sleepiness score is 7 out of 24 today, fatigue score is 28 out of 63. He reports having nightmares at times.  His Past Medical History Is Significant For: Past Medical History:  Diagnosis Date  . Abnormality of gait 04/22/2013  . Asthma   . BPH (benign prostatic hyperplasia)   . Hypertension   . Multiple sclerosis ([place])   . Quadriplegia and quadriparesis ([place]) 03/08/2016    His Past Surgical History Is Significant For: Past Surgical History:  Procedure Laterality Date  . none      His Family History Is Significant For: Family History  Problem Relation Age of Onset  . Cancer Mother        breast  .  Stroke Father   . Cancer - Colon Father   . Multiple sclerosis Neg Hx     His Social History Is Significant For: Social History   Socioeconomic History  . Marital status: Divorced    Spouse name: n/a  . Number of children: 3  . Years of education: 75  . Highest education level: Not on file  Occupational History  . Occupation: disabled/retired 1999-[place]    Comment: Education officer, museum  Social Needs  . Financial resource strain:  Not on file  . Food insecurity:    Worry: Not on file    Inability: Not on file  . Transportation needs:    Medical: Not on file    Non-medical: Not on file  Tobacco Use  . Smoking status: Never Smoker  . Smokeless tobacco: Never Used  Substance and Sexual Activity  . Alcohol use: Yes    Alcohol/week: 0.0 standard drinks    Comment: occasionally wine cooler  . Drug use: No  . Sexual activity: Never  Lifestyle  . Physical activity:    Days per week: Not on file    Minutes per session: Not on file  . Stress: Not on file  Relationships  . Social connections:    Talks on phone: Not on file    Gets together: Not on file    Attends religious service: Not on file    Active member of club or organization: Not on file    Attends meetings of clubs or organizations: Not on file    Relationship status: Not on file  Other Topics Concern  . Not on file  Social History Narrative   Lives alone, house.  3 children.  Caffeine none.  Green Tea once week.      His Allergies Are:  Allergies  Allergen Reactions  . Peanut-Containing Drug Products Shortness Of Breath  . Zanaflex [Tizanidine Hcl] Shortness Of Breath  :   His Current Medications Are:  Outpatient Encounter Medications as of 01/15/2018  Medication Sig  . baclofen (LIORESAL) 20 MG tablet TAKE 1 TABLET IN THE MORNING, 1 AT NOON, AND 2 AT NIGHT  . Dimethyl Fumarate (TECFIDERA) 240 MG CPDR Take 1 capsule (240 mg total) by mouth 2 (two) times daily.  . hydrochlorothiazide (MICROZIDE) 12.5 MG capsule TAKE 1 CAPSULE(12.5 MG) BY MOUTH DAILY  . tamsulosin (FLOMAX) 0.4 MG CAPS capsule Take 1 capsule (0.4 mg total) by mouth daily.  . Armodafinil (NUVIGIL) 150 MG tablet Take 1 tablet (150 mg total) by mouth daily. (Patient not taking: Reported on 01/15/2018)   Facility-Administered Encounter Medications as of 01/15/2018  Medication  . gadopentetate dimeglumine (MAGNEVIST) injection 20 mL  :  Review of Systems:  Out of a complete 14  point review of systems, all are reviewed and negative with the exception of these symptoms as listed below: Review of Systems  Neurological:       Pt presents today to discuss his auto pap. Pt is having difficulty getting used to it.    Objective:  Neurological Exam  Physical Exam Physical Examination:   Vitals:   01/15/18 1132  BP: (!) 135/93  Pulse: 72    General Examination: The patient is a very pleasant 57 y.o. male in no acute distress. He is situated in his motorized wheelchair. He is well groomed.  HEENT: Normocephalic, atraumatic, pupils are equal, round and reactive to light and accommodation. Speech is dysarthric. Airway examination reveals no significant airway. Face is symmetric. Hearing is grossly intact.  Chest: Clear to auscultation without wheezing, rhonchi or crackles noted.  Heart: S1+S2+0, regular and normal without murmurs, rubs or gallops noted.   Abdomen: Soft, non-tender and non-distended.  Extremities: There is no pitting edema.   Skin: Warm and dry without trophic changes noted.  Musculoskeletal: exam reveals no obvious joint deformities, tenderness or joint swelling or erythema.   Neurologically:  Mental status: The patient is awake, alert and oriented in all 4 spheres. His immediate and remote memory, attention, language skills and fund of knowledge are appropriate. There is no evidence of aphasia, agnosia, apraxia or anomia. Speech is clear with normal prosody and enunciation. Thought process is linear. Mood and affect normal.  Cranial nerves II - XII are as described above under HEENT exam.  Motor exam: Normal bulk, strength 3 out of 5 on the left and 4 out of 5 on the right. Romberg is not testable. Fine motor skills and coordination: impaired more so on the left.  Sensory exam: intact to light touch.  Gait, station and balance: not tested, in electric WC.   Assessment and Plan:   In summary, Randy Shaw is a very pleasant  57 year old male with an underlying medical history of multiple sclerosis with associated spastic quadriparesis, followed by Dr. Jannifer Shaw, hypertension, BPH, and overweight state, who presents for follow-up consultation of his sleep disturbance. He started on AutoPAP therapy after a home sleep test showed borderline sleep apnea in May 2019. However, he has struggled with using the AutoPap machine, did not notice any benefit to his sleep consolidation, sleep quality or daytime symptoms. His AHI is suboptimal, he has developed more central sleep apneas. 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 Since he has struggling with tolerance of the AutoPap machine and the mask, and has not noticed any benefit plus his sleep-disordered breathing is not better, we mutually agreed to forego positive airway treatment at this time and I will request discontinuation of his AutoPap therapy through his DME company. At this juncture, he is agreeable to and as needed follow-up in sleep clinic. I answered all his questions today and he was in agreement. I spent 15 minutes in total face-to-face time with the patient, more than 50% of which was spent in counseling and coordination of care, reviewing test results, reviewing medication and discussing or reviewing the diagnosis of OSA, its prognosis and treatment options. Pertinent laboratory and imaging test results that were available during this visit with the patient were reviewed by me and considered in my medical decision making (see chart for details).

## 2018-01-15 NOTE — Progress Notes (Signed)
Order for D/C of auto pap sent to [facility].

## 2018-01-15 NOTE — Patient Instructions (Addendum)
You have not benefited from using the autoPAP. Unfortunately, your sleep apnea score has become worse after treatment. I appreciate you trying AutoPap and being compliant with treatment. As discussed, we will discontinue AutoPap therapy at this time. I will see you back as needed. We will request for your DME company to pick up the machine.

## 2018-01-19 DIAGNOSIS — G4733 Obstructive sleep apnea (adult) (pediatric): Secondary | ICD-10-CM | POA: Diagnosis not present

## 2018-01-23 ENCOUNTER — Other Ambulatory Visit: Payer: Self-pay | Admitting: Neurology

## 2018-01-28 ENCOUNTER — Other Ambulatory Visit: Payer: Self-pay | Admitting: *Deleted

## 2018-01-28 MED ORDER — DIMETHYL FUMARATE 240 MG PO CPDR: 1.0000 | DELAYED_RELEASE_CAPSULE | Freq: Two times a day (BID) | ORAL | 11 refills | Status: DC

## 2018-02-05 ENCOUNTER — Encounter: Payer: Self-pay | Admitting: Neurology

## 2018-02-05 DIAGNOSIS — M2560 Stiffness of unspecified joint, not elsewhere classified: Secondary | ICD-10-CM | POA: Diagnosis not present

## 2018-02-05 DIAGNOSIS — M9901 Segmental and somatic dysfunction of cervical region: Secondary | ICD-10-CM | POA: Diagnosis not present

## 2018-02-05 DIAGNOSIS — R293 Abnormal posture: Secondary | ICD-10-CM | POA: Diagnosis not present

## 2018-02-05 DIAGNOSIS — M542 Cervicalgia: Secondary | ICD-10-CM | POA: Diagnosis not present

## 2018-02-11 DIAGNOSIS — M9901 Segmental and somatic dysfunction of cervical region: Secondary | ICD-10-CM | POA: Diagnosis not present

## 2018-02-11 DIAGNOSIS — M2560 Stiffness of unspecified joint, not elsewhere classified: Secondary | ICD-10-CM | POA: Diagnosis not present

## 2018-02-11 DIAGNOSIS — R293 Abnormal posture: Secondary | ICD-10-CM | POA: Diagnosis not present

## 2018-02-11 DIAGNOSIS — M542 Cervicalgia: Secondary | ICD-10-CM | POA: Diagnosis not present

## 2018-02-21 ENCOUNTER — Other Ambulatory Visit: Payer: Self-pay

## 2018-02-21 ENCOUNTER — Encounter: Payer: Self-pay | Admitting: Family Medicine

## 2018-02-21 ENCOUNTER — Ambulatory Visit: Payer: BLUE CROSS/BLUE SHIELD | Admitting: Family Medicine

## 2018-02-21 VITALS — BP 140/80 | HR 77 | Temp 98.1°F | Ht 75.0 in

## 2018-02-21 DIAGNOSIS — Z1211 Encounter for screening for malignant neoplasm of colon: Secondary | ICD-10-CM

## 2018-02-21 DIAGNOSIS — E785 Hyperlipidemia, unspecified: Secondary | ICD-10-CM

## 2018-02-21 DIAGNOSIS — Z114 Encounter for screening for human immunodeficiency virus [HIV]: Secondary | ICD-10-CM

## 2018-02-21 DIAGNOSIS — R739 Hyperglycemia, unspecified: Secondary | ICD-10-CM

## 2018-02-21 DIAGNOSIS — I10 Essential (primary) hypertension: Secondary | ICD-10-CM | POA: Diagnosis not present

## 2018-02-21 DIAGNOSIS — Z1159 Encounter for screening for other viral diseases: Secondary | ICD-10-CM | POA: Diagnosis not present

## 2018-02-21 MED ORDER — HYDROCHLOROTHIAZIDE 12.5 MG PO CAPS: ORAL_CAPSULE | ORAL | 1 refills | Status: DC

## 2018-02-21 NOTE — Progress Notes (Signed)
Subjective:  By signing my name below, I, Stann Ore, attest that this documentation has been prepared under the direction and in the presence of Meredith Staggers, MD. Electronically Signed: Stann Ore, Scribe. 02/21/2018 , 2:03 PM .  Patient was seen in Room 1 .   Patient ID: Randy Shaw, male    DOB: Dec 20, 1960, 57 y.o.   MRN: [id number] Chief Complaint  Patient presents with  . Hypertension    6 m f/u   . Fall    positive screening    HPI Randy Shaw is a 57 y.o. male Here for HTN follow up. He was last seen for a physical in April. He has a history of MS, HTN, and BPH. His MS is treated by Dr. Anne Hahn, taking Tecfidera; last office visit in Sept 12th, treated with Nuvigil and Baclofen.   HTN He's taking HCTZ 12.5 mg qd. He denies any new side effects with his medication. He denies chest pain, shortness of breath, headache, lightheadedness, or dizziness.   BP Readings from Last 3 Encounters:  02/21/18 140/80  01/15/18 (!) 135/93  01/10/18 (!) 141/90   Lab Results  Component Value Date   CREATININE 0.94 01/10/2018   Hyperglycemia Borderline on most recent labs; normal but borderline elevated A1C of 5.6 in April. He mentions family history of diabetes in daughter, aunt, and father. He hasn't checked his weight recently.   Lipid screening Lab Results  Component Value Date   CHOL 210 (H) 08/23/2017   HDL 65 08/23/2017   LDLCALC 128 (H) 08/23/2017   TRIG 86 08/23/2017   CHOLHDL 3.2 08/23/2017   Plan for initial diet approach with repeat testing in 6 months.   The 10-year ASCVD risk score Denman George DC Montez Hageman., et al., 2013) is: 13.7%   Values used to calculate the score:     Age: 42 years     Sex: Male     Is Non-Hispanic African American: Yes     Diabetic: No     Tobacco smoker: No     Systolic Blood Pressure: 140 mmHg     Is BP treated: Yes     HDL Cholesterol: 65 mg/dL     Total Cholesterol: 210 mg/dL  Health maintenance Colonoscopy: information  provided with option of colonoscopy and ColoGuard; would like to do ColoGuard.  Hep C + HIV screening: added to blood work today.    Positive fall screening Fall Risk  02/21/2018 08/23/2017 02/20/2017 08/11/2016 07/04/2016  Falls in the past year? Yes Yes No No Yes  Number falls in past yr: 2 or more 2 or more - - 2 or more  Injury with Fall? No No - - No  Risk for fall due to : - History of fall(s) - - -     Patient Active Problem List   Diagnosis Date Noted  . 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  . Abnormality of gait 04/22/2013  . HTN (hypertension) 06/29/2012  . Multiple sclerosis ([facility]) 06/29/2012  . Benign prostatic hyperplasia with urinary obstruction 06/29/2012   Past Medical History:  Diagnosis Date  . Abnormality of gait 04/22/2013  . Asthma   . BPH (benign prostatic hyperplasia)   . Hypertension   . Multiple sclerosis ([facility])   . 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   Past Surgical History:  Procedure Laterality Date  . none     Allergies  Allergen Reactions  . Peanut-Containing Drug Products Shortness Of Breath  . Zanaflex [Tizanidine Hcl] Shortness Of Breath  Prior to Admission medications   Medication Sig Start Date End Date Taking? Authorizing Provider  Armodafinil (NUVIGIL) 150 MG tablet Take 1 tablet (150 mg total) by mouth daily. 01/14/18  Yes York Spaniel, MD  baclofen (LIORESAL) 20 MG tablet TAKE 1 TABLET IN THE MORNING, 1 AT NOON, AND 2 AT NIGHT 01/10/18  Yes York Spaniel, MD  Dimethyl Fumarate (TECFIDERA) 240 MG CPDR Take 1 capsule (240 mg total) by mouth 2 (two) times daily. 01/28/18  Yes York Spaniel, MD  hydrochlorothiazide (MICROZIDE) 12.5 MG capsule TAKE 1 CAPSULE(12.5 MG) BY MOUTH DAILY 08/23/17  Yes Shade Flood, MD  tamsulosin (FLOMAX) 0.4 MG CAPS capsule Take 1 capsule (0.4 mg total) by mouth daily. 07/01/14  Yes Tonye Pearson, MD   Social History   Socioeconomic History  . Marital status: Divorced     Spouse name: n/a  . Number of children: 3  . Years of education: 38  . Highest education level: Not on file  Occupational History  . Occupation: disabled/retired 1999-[place]    Comment: Event organiser  Social Needs  . Financial resource strain: Not on file  . Food insecurity:    Worry: Not on file    Inability: Not on file  . Transportation needs:    Medical: Not on file    Non-medical: Not on file  Tobacco Use  . Smoking status: Never Smoker  . Smokeless tobacco: Never Used  Substance and Sexual Activity  . Alcohol use: Yes    Alcohol/week: 0.0 standard drinks    Comment: occasionally wine cooler  . Drug use: No  . Sexual activity: Never  Lifestyle  . Physical activity:    Days per week: Not on file    Minutes per session: Not on file  . Stress: Not on file  Relationships  . Social connections:    Talks on phone: Not on file    Gets together: Not on file    Attends religious service: Not on file    Active member of club or organization: Not on file    Attends meetings of clubs or organizations: Not on file    Relationship status: Not on file  . Intimate partner violence:    Fear of current or ex partner: Not on file    Emotionally abused: Not on file    Physically abused: Not on file    Forced sexual activity: Not on file  Other Topics Concern  . Not on file  Social History Narrative   Lives alone, house.  3 children.  Caffeine none.  Green Tea once week.     Review of Systems  Constitutional: Negative for fatigue and unexpected weight change.  Eyes: Negative for visual disturbance.  Respiratory: Negative for cough, chest tightness and shortness of breath.   Cardiovascular: Negative for chest pain, palpitations and leg swelling.  Gastrointestinal: Negative for abdominal pain and blood in stool.  Neurological: Negative for dizziness, light-headedness and headaches.       Objective:   Physical Exam  Constitutional: He is oriented to person, place, and  time. He appears well-developed and well-nourished.  HENT:  Head: Normocephalic and atraumatic.  Eyes: Pupils are equal, round, and reactive to light. EOM are normal.  Neck: No JVD present. Carotid bruit is not present.  Cardiovascular: Normal rate, regular rhythm and normal heart sounds.  No murmur heard. Pulmonary/Chest: Effort normal and breath sounds normal. He has no rales.  Musculoskeletal: He exhibits no edema.  Neurological:  He is alert and oriented to person, place, and time.  Skin: Skin is warm and dry.  Psychiatric: He has a normal mood and affect.  Vitals reviewed.   Vitals:   02/21/18 1315 02/21/18 1322  BP: (!) 157/96 140/80  Pulse: 77   Temp: 98.1 F (36.7 C)   TempSrc: Oral   SpO2: 95%   Height: 6\' 3"  (1.905 m)        Assessment & Plan:   Randy Shaw is a 57 y.o. male Essential hypertension - Plan: Lipid panel, hydrochlorothiazide (MICROZIDE) 12.5 MG capsule  -  Borderline, but stable, tolerating current regimen. Medications refilled. Labs pending as above.   Hyperlipidemia, unspecified hyperlipidemia type - Plan: Lipid panel  -Repeat testing, determine ASCVD risk, consider statin.  Hyperglycemia - Plan: Hemoglobin A1c  -Mild elevation, screen for diabetes/prediabetes with A1c  Encounter for hepatitis C screening test for low risk patient - Plan: Hepatitis C antibody  Encounter for screening for HIV - Plan: HIV Antibody (routine testing w rflx)  Screen for colon cancer - Plan: Cologuard  -Colon cancer screening options discussed including colonoscopy or Cologuard with potential need ultimately for colonoscopy/diagnostic colonoscopy.  Chose Cologuard as initial approach.  Meds ordered this encounter  Medications  . hydrochlorothiazide (MICROZIDE) 12.5 MG capsule    Sig: TAKE 1 CAPSULE(12.5 MG) BY MOUTH DAILY    Dispense:  90 capsule    Refill:  1   Patient Instructions    I will check the prediabetes test, but that was normal last time.   I  will order the cologuard test.   No change in meds at this time.   Thanks for coming in today.    If you have lab work done today you will be contacted with your lab results within the next 2 weeks.  If you have not heard from [place] then please contact us. The fastest way to get your results is to register for My Chart.   IF you received an x-ray today, you will receive an invoice from [facility]. Please contact [facility] at [phone] with questions or concerns regarding your invoice.   IF you received labwork today, you will receive an invoice from [place]. Please contact [facility] at [phone] with questions or concerns regarding your invoice.   Our billing staff will not be able to assist you with questions regarding bills from these companies.  You will be contacted with the lab results as soon as they are available. The fastest way to get your results is to activate your My Chart account. Instructions are located on the last page of this paperwork. If you have not heard from [place] regarding the results in 2 weeks, please contact this office.       I personally performed the services described in this documentation, which was scribed in my presence. The recorded information has been reviewed and considered for accuracy and completeness, addended by me as needed, and agree with information above.  Signed,   Meredith Staggers, MD [facility].  02/21/18 6:30 PM

## 2018-02-21 NOTE — Patient Instructions (Addendum)
  I will check the prediabetes test, but that was normal last time.   I will order the cologuard test.   No change in meds at this time.   Thanks for coming in today.    If you have lab work done today you will be contacted with your lab results within the next 2 weeks.  If you have not heard from [place] then please contact us. The fastest way to get your results is to register for My Chart.   IF you received an x-ray today, you will receive an invoice from [facility]. Please contact [facility] at [phone] with questions or concerns regarding your invoice.   IF you received labwork today, you will receive an invoice from [place]. Please contact [facility] at [phone] with questions or concerns regarding your invoice.   Our billing staff will not be able to assist you with questions regarding bills from these companies.  You will be contacted with the lab results as soon as they are available. The fastest way to get your results is to activate your My Chart account. Instructions are located on the last page of this paperwork. If you have not heard from [place] regarding the results in 2 weeks, please contact this office.

## 2018-02-22 LAB — LIPID PANEL
CHOLESTEROL TOTAL: 226 mg/dL — AB (ref 100–199)
Chol/HDL Ratio: 3.3 ratio (ref 0.0–5.0)
HDL: 69 mg/dL (ref >39)
LDL CALC: 140 mg/dL — AB (ref 0–99)
TRIGLYCERIDES: 87 mg/dL (ref 0–149)
VLDL CHOLESTEROL CAL: 17 mg/dL (ref 5–40)

## 2018-02-22 LAB — HEMOGLOBIN A1C
ESTIMATED AVERAGE GLUCOSE: 114 mg/dL
Hgb A1c MFr Bld: 5.6 % (ref 4.8–5.6)

## 2018-02-22 LAB — HIV ANTIBODY (ROUTINE TESTING W REFLEX): HIV Screen 4th Generation wRfx: NONREACTIVE

## 2018-02-22 LAB — HEPATITIS C ANTIBODY

## 2018-02-25 DIAGNOSIS — M9901 Segmental and somatic dysfunction of cervical region: Secondary | ICD-10-CM | POA: Diagnosis not present

## 2018-02-25 DIAGNOSIS — M2560 Stiffness of unspecified joint, not elsewhere classified: Secondary | ICD-10-CM | POA: Diagnosis not present

## 2018-02-25 DIAGNOSIS — R293 Abnormal posture: Secondary | ICD-10-CM | POA: Diagnosis not present

## 2018-02-25 DIAGNOSIS — M542 Cervicalgia: Secondary | ICD-10-CM | POA: Diagnosis not present

## 2018-02-28 DIAGNOSIS — M9901 Segmental and somatic dysfunction of cervical region: Secondary | ICD-10-CM | POA: Diagnosis not present

## 2018-02-28 DIAGNOSIS — M2560 Stiffness of unspecified joint, not elsewhere classified: Secondary | ICD-10-CM | POA: Diagnosis not present

## 2018-02-28 DIAGNOSIS — R293 Abnormal posture: Secondary | ICD-10-CM | POA: Diagnosis not present

## 2018-02-28 DIAGNOSIS — M542 Cervicalgia: Secondary | ICD-10-CM | POA: Diagnosis not present

## 2018-03-04 DIAGNOSIS — M542 Cervicalgia: Secondary | ICD-10-CM | POA: Diagnosis not present

## 2018-03-04 DIAGNOSIS — Z1211 Encounter for screening for malignant neoplasm of colon: Secondary | ICD-10-CM | POA: Diagnosis not present

## 2018-03-04 DIAGNOSIS — M9901 Segmental and somatic dysfunction of cervical region: Secondary | ICD-10-CM | POA: Diagnosis not present

## 2018-03-04 DIAGNOSIS — R293 Abnormal posture: Secondary | ICD-10-CM | POA: Diagnosis not present

## 2018-03-04 DIAGNOSIS — M2560 Stiffness of unspecified joint, not elsewhere classified: Secondary | ICD-10-CM | POA: Diagnosis not present

## 2018-03-07 DIAGNOSIS — R293 Abnormal posture: Secondary | ICD-10-CM | POA: Diagnosis not present

## 2018-03-07 DIAGNOSIS — M542 Cervicalgia: Secondary | ICD-10-CM | POA: Diagnosis not present

## 2018-03-07 DIAGNOSIS — M2560 Stiffness of unspecified joint, not elsewhere classified: Secondary | ICD-10-CM | POA: Diagnosis not present

## 2018-03-07 DIAGNOSIS — M9901 Segmental and somatic dysfunction of cervical region: Secondary | ICD-10-CM | POA: Diagnosis not present

## 2018-03-11 DIAGNOSIS — M9901 Segmental and somatic dysfunction of cervical region: Secondary | ICD-10-CM | POA: Diagnosis not present

## 2018-03-11 DIAGNOSIS — M542 Cervicalgia: Secondary | ICD-10-CM | POA: Diagnosis not present

## 2018-03-11 DIAGNOSIS — R293 Abnormal posture: Secondary | ICD-10-CM | POA: Diagnosis not present

## 2018-03-11 DIAGNOSIS — M2560 Stiffness of unspecified joint, not elsewhere classified: Secondary | ICD-10-CM | POA: Diagnosis not present

## 2018-03-11 LAB — COLOGUARD: Cologuard: NEGATIVE

## 2018-03-14 DIAGNOSIS — R293 Abnormal posture: Secondary | ICD-10-CM | POA: Diagnosis not present

## 2018-03-14 DIAGNOSIS — M542 Cervicalgia: Secondary | ICD-10-CM | POA: Diagnosis not present

## 2018-03-14 DIAGNOSIS — M9901 Segmental and somatic dysfunction of cervical region: Secondary | ICD-10-CM | POA: Diagnosis not present

## 2018-03-14 DIAGNOSIS — M2560 Stiffness of unspecified joint, not elsewhere classified: Secondary | ICD-10-CM | POA: Diagnosis not present

## 2018-03-18 DIAGNOSIS — M9901 Segmental and somatic dysfunction of cervical region: Secondary | ICD-10-CM | POA: Diagnosis not present

## 2018-03-18 DIAGNOSIS — R293 Abnormal posture: Secondary | ICD-10-CM | POA: Diagnosis not present

## 2018-03-18 DIAGNOSIS — M2560 Stiffness of unspecified joint, not elsewhere classified: Secondary | ICD-10-CM | POA: Diagnosis not present

## 2018-03-18 DIAGNOSIS — M542 Cervicalgia: Secondary | ICD-10-CM | POA: Diagnosis not present

## 2018-03-21 DIAGNOSIS — M542 Cervicalgia: Secondary | ICD-10-CM | POA: Diagnosis not present

## 2018-03-21 DIAGNOSIS — M2560 Stiffness of unspecified joint, not elsewhere classified: Secondary | ICD-10-CM | POA: Diagnosis not present

## 2018-03-21 DIAGNOSIS — R293 Abnormal posture: Secondary | ICD-10-CM | POA: Diagnosis not present

## 2018-03-21 DIAGNOSIS — M9901 Segmental and somatic dysfunction of cervical region: Secondary | ICD-10-CM | POA: Diagnosis not present

## 2018-05-24 ENCOUNTER — Other Ambulatory Visit: Payer: Self-pay | Admitting: Neurology

## 2018-05-24 NOTE — Telephone Encounter (Signed)
Pt is due for a refill on armodafinil and is up to date on his appts. [place] [facility] checked.

## 2018-07-01 DIAGNOSIS — N401 Enlarged prostate with lower urinary tract symptoms: Secondary | ICD-10-CM | POA: Diagnosis not present

## 2018-07-01 DIAGNOSIS — N3941 Urge incontinence: Secondary | ICD-10-CM | POA: Diagnosis not present

## 2018-07-08 DIAGNOSIS — N3941 Urge incontinence: Secondary | ICD-10-CM | POA: Diagnosis not present

## 2018-07-08 DIAGNOSIS — N401 Enlarged prostate with lower urinary tract symptoms: Secondary | ICD-10-CM | POA: Diagnosis not present

## 2018-07-31 ENCOUNTER — Telehealth: Payer: Self-pay

## 2018-07-31 NOTE — Telephone Encounter (Addendum)
I contacted the pt in regards to his 08/05/18 appt..  I advised, Due to current COVID 19 pandemic, our office is severely reducing in office visits for at least the next 2 weeks, in order to minimize the risk to our patients and healthcare providers.   Pt was offer a video visit with Dr. Anne Hahn on 08/05/18 at 2:30 pm and accepted.   Pt understands that although there may be some limitations with this type of visit, we will take all precautions to reduce any security or privacy concerns.  Pt understands that this will be treated like an in office visit and we will file with pt's insurance, and there may be a patient responsible charge related to this service.  Pt's email is [email or web address]. Pt understands that the [employer] webex software must be downloaded and operational on the device pt plans to use for the visit.  Pt's medications, allergies, pharmacy and history has been reviewed.

## 2018-08-05 ENCOUNTER — Ambulatory Visit (INDEPENDENT_AMBULATORY_CARE_PROVIDER_SITE_OTHER): Payer: BLUE CROSS/BLUE SHIELD | Admitting: Neurology

## 2018-08-05 ENCOUNTER — Other Ambulatory Visit: Payer: Self-pay

## 2018-08-05 ENCOUNTER — Encounter: Payer: Self-pay | Admitting: Neurology

## 2018-08-05 ENCOUNTER — Telehealth: Payer: Self-pay | Admitting: Neurology

## 2018-08-05 DIAGNOSIS — G35 Multiple sclerosis: Secondary | ICD-10-CM

## 2018-08-05 DIAGNOSIS — R269 Unspecified abnormalities of gait and mobility: Secondary | ICD-10-CM

## 2018-08-05 NOTE — Telephone Encounter (Signed)
Called pt- stating he would like to try the visit again tomorrow at 9:30.

## 2018-08-05 NOTE — Progress Notes (Signed)
The patient did not answer his telephone call, did not come on line with a WebEx meeting.

## 2018-08-05 NOTE — Telephone Encounter (Signed)
This patient did not show for a WebEx revisit today.

## 2018-08-05 NOTE — Telephone Encounter (Signed)
Pt called in stated he messed up the link when logging in ...  [email or web address]

## 2018-08-05 NOTE — Telephone Encounter (Addendum)
Randy Shaw, Could you contact the pt and offer a reschedule visit for 08/06/18 at 2:30 or 9:30 am? I will add to epic and webex calendar if pt agrees. Also, could you  advise I will be sending another e-mail for the visit time he agrees to. Thanks!

## 2018-08-05 NOTE — Telephone Encounter (Signed)
Noted. New webex appt has been submitted to the e-mail stated. Appt added in epic and webex.

## 2018-08-06 ENCOUNTER — Ambulatory Visit (INDEPENDENT_AMBULATORY_CARE_PROVIDER_SITE_OTHER): Payer: BLUE CROSS/BLUE SHIELD | Admitting: Neurology

## 2018-08-06 ENCOUNTER — Encounter: Payer: Self-pay | Admitting: Neurology

## 2018-08-06 ENCOUNTER — Other Ambulatory Visit: Payer: Self-pay

## 2018-08-06 DIAGNOSIS — G35 Multiple sclerosis: Secondary | ICD-10-CM

## 2018-08-06 DIAGNOSIS — Z5181 Encounter for therapeutic drug level monitoring: Secondary | ICD-10-CM

## 2018-08-06 DIAGNOSIS — R269 Unspecified abnormalities of gait and mobility: Secondary | ICD-10-CM | POA: Diagnosis not present

## 2018-08-06 DIAGNOSIS — G8250 Quadriplegia, unspecified: Secondary | ICD-10-CM | POA: Diagnosis not present

## 2018-08-06 NOTE — Progress Notes (Signed)
     Virtual Visit via Video Note  I connected with Randy Shaw on 08/06/18 at  9:30 AM EDT by a video enabled telemedicine application and verified that I am speaking with the correct person using two identifiers.   I discussed the limitations of evaluation and management by telemedicine and the availability of in person appointments. The patient expressed understanding and agreed to proceed.  History of Present Illness: Randy Shaw is a 58 year old right-handed black male with a history of multiple sclerosis.  The patient is on Tecfidera, he is tolerating the medication well.  He uses a motorized wheelchair to get around, he lives alone.  Uses [employer] for transportation.  He is able to walk short distances by holding onto walls, he has not had any falls.  He believes that the double vision is bothering him more, he has a well-documented left INO, he has more prominent double vision when looking to the right.  He has not seen an ophthalmologist in quite some time.  He reports no new numbness or weakness of the face, arms, legs.  He denies any new changes with bowel bladder control.  He is otherwise doing well, no other new medical issues have come up.   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  The patient has good facial symmetry, he is able to protrude the tongue in the midline with good lateral movement of the tongue.  He has good finger-nose-finger but has difficulty performing heel-to-shin on both sides, the left leg is weaker, he has to lift the leg.  He was not ambulated.  Assessment and Plan: 1.  Multiple sclerosis  2.  Quadriparesis, mainly left-sided weakness  3.  Gait disorder  4.  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  The eye  movement abnormalities are chronic in nature.  He last had MRI evaluation 1 year ago, we will consider repeating the study next year.  He will continue the Tecfidera, he will come in for blood work.  He will follow-up in 6 months.  He is doing well on the Nuvigil, this has helped his fatigue.  He remains on baclofen.  Follow Up Instructions: 29-month follow-up, may see nurse practitioner.   I discussed the assessment and treatment plan with the patient. The patient was provided an opportunity to ask questions and all were answered. The patient agreed with the plan and demonstrated an understanding of the instructions.   The patient was advised to call back or seek an in-person evaluation if the symptoms worsen or if the condition fails to improve as anticipated.  I provided 20 minutes of non-face-to-face time during this encounter.   York Spaniel, MD

## 2018-08-13 ENCOUNTER — Other Ambulatory Visit (INDEPENDENT_AMBULATORY_CARE_PROVIDER_SITE_OTHER): Payer: Self-pay

## 2018-08-13 ENCOUNTER — Other Ambulatory Visit: Payer: Self-pay

## 2018-08-13 DIAGNOSIS — Z5181 Encounter for therapeutic drug level monitoring: Secondary | ICD-10-CM

## 2018-08-13 DIAGNOSIS — Z0289 Encounter for other administrative examinations: Secondary | ICD-10-CM

## 2018-08-13 DIAGNOSIS — G35 Multiple sclerosis: Secondary | ICD-10-CM

## 2018-08-14 LAB — CBC WITH DIFFERENTIAL/PLATELET
Basophils Absolute: 0.1 10*3/uL (ref 0.0–0.2)
Basos: 1 %
EOS (ABSOLUTE): 0.3 10*3/uL (ref 0.0–0.4)
Eos: 5 %
Hematocrit: 42.7 % (ref 37.5–51.0)
Hemoglobin: 14.1 g/dL (ref 13.0–17.7)
Immature Grans (Abs): 0 10*3/uL (ref 0.0–0.1)
Immature Granulocytes: 0 %
Lymphocytes Absolute: 2.3 10*3/uL (ref 0.7–3.1)
Lymphs: 37 %
MCH: 27.3 pg (ref 26.6–33.0)
MCHC: 33 g/dL (ref 31.5–35.7)
MCV: 83 fL (ref 79–97)
Monocytes Absolute: 0.6 10*3/uL (ref 0.1–0.9)
Monocytes: 9 %
Neutrophils Absolute: 3.1 10*3/uL (ref 1.4–7.0)
Neutrophils: 48 %
Platelets: 229 10*3/uL (ref 150–450)
RBC: 5.17 x10E6/uL (ref 4.14–5.80)
RDW: 12.8 % (ref 11.6–15.4)
WBC: 6.4 10*3/uL (ref 3.4–10.8)

## 2018-08-14 LAB — COMPREHENSIVE METABOLIC PANEL
ALT: 17 IU/L (ref 0–44)
AST: 14 IU/L (ref 0–40)
Albumin/Globulin Ratio: 2 (ref 1.2–2.2)
Albumin: 4.5 g/dL (ref 3.8–4.9)
Alkaline Phosphatase: 84 IU/L (ref 39–117)
BUN/Creatinine Ratio: 13 (ref 9–20)
BUN: 12 mg/dL (ref 6–24)
Bilirubin Total: 0.6 mg/dL (ref 0.0–1.2)
CO2: 23 mmol/L (ref 20–29)
Calcium: 9.5 mg/dL (ref 8.7–10.2)
Chloride: 105 mmol/L (ref 96–106)
Creatinine, Ser: 0.94 mg/dL (ref 0.76–1.27)
GFR calc Af Amer: 103 mL/min/{1.73_m2} (ref >59)
GFR calc non Af Amer: 89 mL/min/{1.73_m2} (ref >59)
Globulin, Total: 2.2 g/dL (ref 1.5–4.5)
Glucose: 90 mg/dL (ref 65–99)
Potassium: 3.8 mmol/L (ref 3.5–5.2)
Sodium: 144 mmol/L (ref 134–144)
Total Protein: 6.7 g/dL (ref 6.0–8.5)

## 2018-09-19 ENCOUNTER — Other Ambulatory Visit: Payer: Self-pay | Admitting: Family Medicine

## 2018-09-19 DIAGNOSIS — I10 Essential (primary) hypertension: Secondary | ICD-10-CM

## 2018-10-19 ENCOUNTER — Other Ambulatory Visit: Payer: Self-pay | Admitting: Family Medicine

## 2018-10-19 DIAGNOSIS — I10 Essential (primary) hypertension: Secondary | ICD-10-CM

## 2018-10-19 NOTE — Telephone Encounter (Signed)
Requested Prescriptions  Pending Prescriptions Disp Refills  . hydrochlorothiazide (MICROZIDE) 12.5 MG capsule [Pharmacy Med Name: HYDROCHLOROTHIAZIDE 12.5MG  CAPSULES] 30 capsule 0    Sig: TAKE 1 CAPSULE(12.5 MG) BY MOUTH DAILY     Cardiovascular: Diuretics - Thiazide Failed - 10/19/2018  6:29 AM      Failed - Last BP in normal range    BP Readings from Last 1 Encounters:  02/21/18 140/80         Failed - Valid encounter within last 6 months    Recent Outpatient Visits          8 months ago Essential hypertension   [facility], Ranell Patrick, MD   1 year ago Annual physical exam   [facility], Ranell Patrick, MD   1 year ago Snoring   [facility], Ranell Patrick, MD   2 years ago Multiple sclerosis [facility])   [facility], Ranell Patrick, MD   2 years ago Foot pain, left   Primary Care at [facility], [place], [place] in normal range and within 360 days    Calcium  Date Value Ref Range Status  08/13/2018 9.5 8.7 - 10.2 mg/dL Final         Passed - Cr in normal range and within 360 days    Creat  Date Value Ref Range Status  07/30/2013 0.89 0.50 - 1.35 mg/dL Final   Creatinine, Ser  Date Value Ref Range Status  08/13/2018 0.94 0.76 - 1.27 mg/dL Final         Passed - K in normal range and within 360 days    Potassium  Date Value Ref Range Status  08/13/2018 3.8 3.5 - 5.2 mmol/L Final         Passed - Na in normal range and within 360 days    Sodium  Date Value Ref Range Status  08/13/2018 144 134 - 144 mmol/L Final

## 2018-10-24 DIAGNOSIS — N401 Enlarged prostate with lower urinary tract symptoms: Secondary | ICD-10-CM | POA: Diagnosis not present

## 2018-10-24 DIAGNOSIS — R339 Retention of urine, unspecified: Secondary | ICD-10-CM | POA: Diagnosis not present

## 2018-10-31 DIAGNOSIS — R339 Retention of urine, unspecified: Secondary | ICD-10-CM | POA: Diagnosis not present

## 2018-11-14 DIAGNOSIS — N390 Urinary tract infection, site not specified: Secondary | ICD-10-CM | POA: Diagnosis not present

## 2018-11-14 DIAGNOSIS — B952 Enterococcus as the cause of diseases classified elsewhere: Secondary | ICD-10-CM | POA: Diagnosis not present

## 2018-11-14 DIAGNOSIS — R339 Retention of urine, unspecified: Secondary | ICD-10-CM | POA: Diagnosis not present

## 2018-11-14 DIAGNOSIS — N401 Enlarged prostate with lower urinary tract symptoms: Secondary | ICD-10-CM | POA: Diagnosis not present

## 2018-11-14 DIAGNOSIS — N3281 Overactive bladder: Secondary | ICD-10-CM | POA: Diagnosis not present

## 2018-11-18 ENCOUNTER — Other Ambulatory Visit: Payer: Self-pay | Admitting: Family Medicine

## 2018-11-18 ENCOUNTER — Other Ambulatory Visit: Payer: Self-pay | Admitting: Neurology

## 2018-11-18 DIAGNOSIS — G35 Multiple sclerosis: Secondary | ICD-10-CM

## 2018-11-18 DIAGNOSIS — I10 Essential (primary) hypertension: Secondary | ICD-10-CM

## 2018-11-18 DIAGNOSIS — R269 Unspecified abnormalities of gait and mobility: Secondary | ICD-10-CM

## 2018-11-18 NOTE — Telephone Encounter (Signed)
Requested medication (s) are due for refill today -yes  Requested medication (s) are on the active medication list -yes  Future visit scheduled -no  Last refill: 10/19/18  Notes to clinic: Patient has had a 30 day courtesy refill- sent for review of RF request.  Requested Prescriptions  Pending Prescriptions Disp Refills   hydrochlorothiazide (MICROZIDE) 12.5 MG capsule [Pharmacy Med Name: HYDROCHLOROTHIAZIDE 12.5MG  CAPSULES] 30 capsule 0    Sig: TAKE 1 CAPSULE(12.5 MG) BY MOUTH DAILY     Cardiovascular: Diuretics - Thiazide Failed - 11/18/2018  6:26 AM      Failed - Last BP in normal range    BP Readings from Last 1 Encounters:  02/21/18 140/80         Failed - Valid encounter within last 6 months    Recent Outpatient Visits          9 months ago Essential hypertension   [facility], Asencion Partridge, MD   1 year ago Annual physical exam   [facility], Asencion Partridge, MD   1 year ago Snoring   [facility], Asencion Partridge, MD   2 years ago Multiple sclerosis [facility])   [facility], Asencion Partridge, MD   2 years ago Foot pain, left   Primary Care at [facility], [place], [place]             Passed - Ca in normal range and within 360 days    Calcium  Date Value Ref Range Status  08/13/2018 9.5 8.7 - 10.2 mg/dL Final         Passed - Cr in normal range and within 360 days    Creat  Date Value Ref Range Status  07/30/2013 0.89 0.50 - 1.35 mg/dL Final   Creatinine, Ser  Date Value Ref Range Status  08/13/2018 0.94 0.76 - 1.27 mg/dL Final         Passed - K in normal range and within 360 days    Potassium  Date Value Ref Range Status  08/13/2018 3.8 3.5 - 5.2 mmol/L Final         Passed - Na in normal range and within 360 days    Sodium  Date Value Ref Range Status  08/13/2018 144 134 - 144 mmol/L Final            Requested Prescriptions  Pending Prescriptions Disp Refills   hydrochlorothiazide (MICROZIDE)  12.5 MG capsule [Pharmacy Med Name: HYDROCHLOROTHIAZIDE 12.5MG  CAPSULES] 30 capsule 0    Sig: TAKE 1 CAPSULE(12.5 MG) BY MOUTH DAILY     Cardiovascular: Diuretics - Thiazide Failed - 11/18/2018  6:26 AM      Failed - Last BP in normal range    BP Readings from Last 1 Encounters:  02/21/18 140/80         Failed - Valid encounter within last 6 months    Recent Outpatient Visits          9 months ago Essential hypertension   [facility], Asencion Partridge, MD   1 year ago Annual physical exam   [facility], Asencion Partridge, MD   1 year ago Snoring   [facility], Asencion Partridge, MD   2 years ago Multiple sclerosis [facility])   [facility], Asencion Partridge, MD   2 years ago Foot pain, left   Primary Care at [facility], [place], [place]  Passed - Ca in normal range and within 360 days    Calcium  Date Value Ref Range Status  08/13/2018 9.5 8.7 - 10.2 mg/dL Final         Passed - Cr in normal range and within 360 days    Creat  Date Value Ref Range Status  07/30/2013 0.89 0.50 - 1.35 mg/dL Final   Creatinine, Ser  Date Value Ref Range Status  08/13/2018 0.94 0.76 - 1.27 mg/dL Final         Passed - K in normal range and within 360 days    Potassium  Date Value Ref Range Status  08/13/2018 3.8 3.5 - 5.2 mmol/L Final         Passed - Na in normal range and within 360 days    Sodium  Date Value Ref Range Status  08/13/2018 144 134 - 144 mmol/L Final

## 2018-11-19 DIAGNOSIS — R339 Retention of urine, unspecified: Secondary | ICD-10-CM | POA: Diagnosis not present

## 2018-11-22 ENCOUNTER — Other Ambulatory Visit: Payer: Self-pay | Admitting: Neurology

## 2018-12-02 ENCOUNTER — Other Ambulatory Visit: Payer: Self-pay | Admitting: Family Medicine

## 2018-12-02 ENCOUNTER — Telehealth: Payer: Self-pay | Admitting: Family Medicine

## 2018-12-02 DIAGNOSIS — I10 Essential (primary) hypertension: Secondary | ICD-10-CM

## 2018-12-02 NOTE — Telephone Encounter (Signed)
Spoke with patient, advised he is due for an OV with Dr. Carlota Raspberry.  He states he will call to schedule appointment now.  Advised I can sent 30 day courtesy refill to last until his upcoming appointment.  Patient states he will call to schedule now.  Requested Prescriptions  Pending Prescriptions Disp Refills  . hydrochlorothiazide (MICROZIDE) 12.5 MG capsule [Pharmacy Med Name: HYDROCHLOROTHIAZIDE 12.5MG  CAPSULES] 30 capsule 0    Sig: TAKE 1 CAPSULE(12.5 MG) BY MOUTH DAILY     Cardiovascular: Diuretics - Thiazide Failed - 12/02/2018 10:43 AM      Failed - Last BP in normal range    BP Readings from Last 1 Encounters:  02/21/18 140/80         Failed - Valid encounter within last 6 months    Recent Outpatient Visits          9 months ago Essential hypertension   [facility], Ranell Patrick, MD   1 year ago Annual physical exam   [facility], Ranell Patrick, MD   1 year ago Snoring   [facility], Ranell Patrick, MD   2 years ago Multiple sclerosis [facility])   [facility] at [facility], Ranell Patrick, MD   2 years ago Foot pain, left   Primary Care at [facility], [place], [place] in normal range and within 360 days    Calcium  Date Value Ref Range Status  08/13/2018 9.5 8.7 - 10.2 mg/dL Final         Passed - Cr in normal range and within 360 days    Creat  Date Value Ref Range Status  07/30/2013 0.89 0.50 - 1.35 mg/dL Final   Creatinine, Ser  Date Value Ref Range Status  08/13/2018 0.94 0.76 - 1.27 mg/dL Final         Passed - K in normal range and within 360 days    Potassium  Date Value Ref Range Status  08/13/2018 3.8 3.5 - 5.2 mmol/L Final         Passed - Na in normal range and within 360 days    Sodium  Date Value Ref Range Status  08/13/2018 144 134 - 144 mmol/L Final

## 2018-12-02 NOTE — Telephone Encounter (Signed)
Medication Refill - Medication: hydrochlorothiazide (MICROZIDE) 12.5 MG capsule     Has the patient contacted their pharmacy? Yes.     (Agent: If yes, when and what did the pharmacy advise?) Pharmacy told pt they could not get into contact with Dr. Carlota Raspberry. Pharmacy told pt to contact Dr. Carlota Raspberry.   Preferred Pharmacy (with phone number or street name):  [place], [address] [phone] (Phone) [phone] (Fax)     Agent: Please be advised that RX refills may take up to 3 business days. We ask that you follow-up with your pharmacy.

## 2018-12-02 NOTE — Telephone Encounter (Signed)
Noted that pt. Was given a 30 day supply of HCTZ 12.5 mg caps today, 12/02/18

## 2018-12-09 ENCOUNTER — Ambulatory Visit: Payer: BLUE CROSS/BLUE SHIELD | Admitting: Family Medicine

## 2018-12-10 ENCOUNTER — Encounter: Payer: Self-pay | Admitting: Family Medicine

## 2018-12-18 ENCOUNTER — Other Ambulatory Visit: Payer: Self-pay | Admitting: Family Medicine

## 2018-12-18 DIAGNOSIS — N401 Enlarged prostate with lower urinary tract symptoms: Secondary | ICD-10-CM | POA: Diagnosis not present

## 2018-12-18 DIAGNOSIS — N3000 Acute cystitis without hematuria: Secondary | ICD-10-CM | POA: Diagnosis not present

## 2018-12-18 DIAGNOSIS — N3281 Overactive bladder: Secondary | ICD-10-CM | POA: Diagnosis not present

## 2018-12-18 DIAGNOSIS — I10 Essential (primary) hypertension: Secondary | ICD-10-CM

## 2018-12-27 ENCOUNTER — Other Ambulatory Visit: Payer: Self-pay | Admitting: Family Medicine

## 2018-12-27 ENCOUNTER — Encounter: Payer: Self-pay | Admitting: Family Medicine

## 2018-12-27 DIAGNOSIS — I10 Essential (primary) hypertension: Secondary | ICD-10-CM

## 2018-12-27 NOTE — Telephone Encounter (Signed)
Requested medication (s) are due for refill today: yes  Requested medication (s) are on the active medication list: yes Last refill:  12/02/2018  Future visit scheduled: no Notes to clinic:  Review for refill  Requested Prescriptions  Pending Prescriptions Disp Refills   hydrochlorothiazide (MICROZIDE) 12.5 MG capsule [Pharmacy Med Name: HYDROCHLOROTHIAZIDE 12.5MG  CAPSULES] 30 capsule 0    Sig: TAKE 1 CAPSULE(12.5 MG) BY MOUTH DAILY     Cardiovascular: Diuretics - Thiazide Failed - 12/27/2018  3:15 PM      Failed - Last BP in normal range    BP Readings from Last 1 Encounters:  02/21/18 140/80         Failed - Valid encounter within last 6 months    Recent Outpatient Visits          10 months ago Essential hypertension   [facility], Ranell Patrick, MD   1 year ago Annual physical exam   [facility] at [facility], Ranell Patrick, MD   1 year ago Snoring   [facility] at [facility], Ranell Patrick, MD   2 years ago Multiple sclerosis [facility])   [facility] at [facility], Ranell Patrick, MD   3 years ago Foot pain, left   Primary Care at [facility], [place], [place] in normal range and within 360 days    Calcium  Date Value Ref Range Status  08/13/2018 9.5 8.7 - 10.2 mg/dL Final         Passed - Cr in normal range and within 360 days    Creat  Date Value Ref Range Status  07/30/2013 0.89 0.50 - 1.35 mg/dL Final   Creatinine, Ser  Date Value Ref Range Status  08/13/2018 0.94 0.76 - 1.27 mg/dL Final         Passed - K in normal range and within 360 days    Potassium  Date Value Ref Range Status  08/13/2018 3.8 3.5 - 5.2 mmol/L Final         Passed - Na in normal range and within 360 days    Sodium  Date Value Ref Range Status  08/13/2018 144 134 - 144 mmol/L Final

## 2018-12-30 ENCOUNTER — Other Ambulatory Visit: Payer: Self-pay | Admitting: *Deleted

## 2018-12-30 DIAGNOSIS — I10 Essential (primary) hypertension: Secondary | ICD-10-CM

## 2018-12-30 MED ORDER — HYDROCHLOROTHIAZIDE 12.5 MG PO CAPS: ORAL_CAPSULE | ORAL | 0 refills | Status: DC

## 2019-01-01 ENCOUNTER — Other Ambulatory Visit: Payer: Self-pay

## 2019-01-01 ENCOUNTER — Ambulatory Visit (INDEPENDENT_AMBULATORY_CARE_PROVIDER_SITE_OTHER): Payer: BC Managed Care – PPO | Admitting: Family Medicine

## 2019-01-01 ENCOUNTER — Encounter: Payer: Self-pay | Admitting: Family Medicine

## 2019-01-01 ENCOUNTER — Ambulatory Visit (INDEPENDENT_AMBULATORY_CARE_PROVIDER_SITE_OTHER): Payer: BC Managed Care – PPO

## 2019-01-01 VITALS — BP 144/83 | HR 82 | Temp 97.9°F | Ht 75.0 in

## 2019-01-01 DIAGNOSIS — I10 Essential (primary) hypertension: Secondary | ICD-10-CM | POA: Diagnosis not present

## 2019-01-01 DIAGNOSIS — E785 Hyperlipidemia, unspecified: Secondary | ICD-10-CM

## 2019-01-01 DIAGNOSIS — T148XXA Other injury of unspecified body region, initial encounter: Secondary | ICD-10-CM | POA: Diagnosis not present

## 2019-01-01 DIAGNOSIS — S91301A Unspecified open wound, right foot, initial encounter: Secondary | ICD-10-CM

## 2019-01-01 DIAGNOSIS — L97519 Non-pressure chronic ulcer of other part of right foot with unspecified severity: Secondary | ICD-10-CM

## 2019-01-01 LAB — COMPREHENSIVE METABOLIC PANEL
ALT: 15 IU/L (ref 0–44)
AST: 12 IU/L (ref 0–40)
Albumin/Globulin Ratio: 1.6 (ref 1.2–2.2)
Albumin: 4.4 g/dL (ref 3.8–4.9)
Alkaline Phosphatase: 79 IU/L (ref 39–117)
BUN/Creatinine Ratio: 16 (ref 9–20)
BUN: 14 mg/dL (ref 6–24)
Bilirubin Total: 0.3 mg/dL (ref 0.0–1.2)
CO2: 24 mmol/L (ref 20–29)
Calcium: 10 mg/dL (ref 8.7–10.2)
Chloride: 101 mmol/L (ref 96–106)
Creatinine, Ser: 0.9 mg/dL (ref 0.76–1.27)
GFR calc Af Amer: 108 mL/min/{1.73_m2} (ref >59)
GFR calc non Af Amer: 93 mL/min/{1.73_m2} (ref >59)
Globulin, Total: 2.7 g/dL (ref 1.5–4.5)
Glucose: 98 mg/dL (ref 65–99)
Potassium: 4.1 mmol/L (ref 3.5–5.2)
Sodium: 140 mmol/L (ref 134–144)
Total Protein: 7.1 g/dL (ref 6.0–8.5)

## 2019-01-01 LAB — LIPID PANEL
Chol/HDL Ratio: 3.5 ratio (ref 0.0–5.0)
Cholesterol, Total: 233 mg/dL — ABNORMAL HIGH (ref 100–199)
HDL: 67 mg/dL (ref >39)
LDL Chol Calc (NIH): 149 mg/dL — ABNORMAL HIGH (ref 0–99)
Triglycerides: 98 mg/dL (ref 0–149)
VLDL Cholesterol Cal: 17 mg/dL (ref 5–40)

## 2019-01-01 MED ORDER — CEPHALEXIN 500 MG PO CAPS: 500.0000 mg | ORAL_CAPSULE | Freq: Two times a day (BID) | ORAL | 0 refills | Status: DC

## 2019-01-01 MED ORDER — HYDROCHLOROTHIAZIDE 12.5 MG PO CAPS: ORAL_CAPSULE | ORAL | 1 refills | Status: DC

## 2019-01-01 NOTE — Patient Instructions (Addendum)
Keep follow up with neurology, but if worse double vision - they did mention meeting with ophthalmology for possible prisms.   Blood pressure was borderline high today.  Okay to continue same medications if you can change salt in the diet.  Try to avoid fast food, restaurant food and prepare more meals at home if possible.  Recheck 1 month.  Can also check cholesterol levels today but depending on diet changes that may also improved without medication.  Can discuss further at 1 month follow-up  See information below on blisters.  Keep area clean and covered, and padding over the area that has formed a blister.  I will refer you to podiatry for the ulcer but let you know if there are concerns on your x-ray.  For now start antibiotic Keflex 1 pill twice per day.  If any new redness swelling or pain around area be seen right away.   Blisters, Adult  A blister is a raised bubble of skin filled with liquid. Blisters often develop in an area of the skin that repeatedly rubs or presses against another surface (friction blister). Friction blisters can occur on any part of the body, but they usually develop on the hands or feet. Long-term pressure on the same area of the skin can also lead to areas of hardened skin (calluses). What are the causes? A blister can be caused by:  An injury.  A burn.  An allergic reaction.  An infection.  Exposure to irritating chemicals.  Friction, especially in an area with a lot of heat and moisture. Friction blisters often result from:  Sports.  Repetitive activities.  Using tools and doing other activities without wearing gloves.  Shoes that are too tight or too loose. What are the signs or symptoms? A blister is often round and looks like a bump. It may:  Itch.  Be painful to the touch. Before a blister forms, the skin may:  Become red.  Feel warm.  Itch.  Be painful to the touch. How is this diagnosed? A blister is diagnosed with a  physical exam. How is this treated? Treatment usually involves protecting the area where the blister has formed until the skin has healed. Other treatments may include:  A bandage (dressing) to cover the blister.  Extra padding around and over the blister, so that it does not rub on anything.  Antibiotic ointment. Most blisters break open, dry up, and go away on their own within 1-2 weeks. Blisters that are very painful may be drained before they break open on their own. If the blister is large or painful, it can be drained by: 1. Sterilizing a small needle with rubbing alcohol. 2. Washing your hands with soap and water. 3. Inserting the needle in the edge of the blister to make a small hole. Some fluid will drain out of the hole. Let the top or roof of the blister stay in place. This helps the skin heal. 4. Washing the blister with mild soap and water. 5. Covering the blister with antibiotic ointment, if prescribed by your health care provider, and a dressing. Some blisters may need to be drained by a health care provider. Follow these instructions at home:  Protect the area where the blister has formed as told by your health care provider.  Keep your blister clean and dry. This helps to prevent infection.  Do not pop your blister. This can cause infection.  If you were prescribed an antibiotic, use it as told by your  health care provider. Do not stop using the antibiotic even if your condition improves.  Wear different shoes until the blister heals.  Avoid the activity that caused the blister until your blister heals.  Check your blister every day for signs of infection. Check for: ? More redness, swelling, or pain. ? More fluid or blood. ? Warmth. ? Pus or a bad smell. ? The blister getting better and then getting worse. How is this prevented? Taking these steps can help to prevent blisters that are caused by friction. Make sure you:  Wear comfortable shoes that fit  well.  Always wear socks with shoes.  Wear extra socks or use tape, bandages, or pads over blister-prone areas as needed. You may also apply petroleum jelly under bandages in blister-prone areas.  Wear protective gear, such as gloves, when participating in sports or activities that can cause blisters.  Wear loose-fitting, moisture-wicking clothes when participating in sports or activities.  Use powders as needed to keep your feet dry. Contact a health care provider if:  You have more redness, swelling, or pain around your blister.  You have more fluid or blood coming from your blister.  Your blister feels warm to the touch.  You have pus or a bad smell coming from your blister.  You have a fever or chills.  Your blister gets better and then it gets worse. This information is not intended to replace advice given to you by your health care provider. Make sure you discuss any questions you have with your health care provider. Document Released: 05/25/2004 Document Revised: 03/30/2017 Document Reviewed: 10/29/2015 Elsevier Patient Education  [employer].    If you have lab work done today you will be contacted with your lab results within the next 2 weeks.  If you have not heard from [place] then please contact us. The fastest way to get your results is to register for My Chart.   IF you received an x-ray today, you will receive an invoice from [facility]. Please contact [facility] at [phone] with questions or concerns regarding your invoice.   IF you received labwork today, you will receive an invoice from [place]. Please contact [facility] at [phone] with questions or concerns regarding your invoice.   Our billing staff will not be able to assist you with questions regarding bills from these companies.  You will be contacted with the lab results as soon as they are available. The fastest way to get your results is to activate your My Chart account.  Instructions are located on the last page of this paperwork. If you have not heard from [place] regarding the results in 2 weeks, please contact this office.

## 2019-01-01 NOTE — Progress Notes (Signed)
Subjective:    Patient ID: Randy Shaw, male    DOB: February 03, 1961, 58 y.o.   MRN: [id number]  HPI Randy Shaw is a 58 y.o. male Presents today for: Chief Complaint  Patient presents with  . Medication Refill    BP  . Hypertension  . right foot wound    that will not heal correctly and a boile/ blister bubble   Hypertension: BP Readings from Last 3 Encounters:  01/01/19 (!) 144/83  02/21/18 140/80  01/15/18 (!) 135/93  HCTZ 12.5mg  qd.  No new side effects. No home readings.     Lab Results  Component Value Date   CREATININE 0.94 08/13/2018   Hyperlipidemia:  Lab Results  Component Value Date   CHOL 226 (H) 02/21/2018   HDL 69 02/21/2018   LDLCALC 140 (H) 02/21/2018   TRIG 87 02/21/2018   CHOLHDL 3.3 02/21/2018   Lab Results  Component Value Date   ALT 17 08/13/2018   AST 14 08/13/2018   ALKPHOS 84 08/13/2018   BILITOT 0.6 08/13/2018   The 10-year ASCVD risk score Denman George DC Jr., et al., 2013) is: 15%   Values used to calculate the score:     Age: 36 years     Sex: Male     Is Non-Hispanic African American: Yes     Diabetic: No     Tobacco smoker: No     Systolic Blood Pressure: 144 mmHg     Is BP treated: Yes     HDL Cholesterol: 69 mg/dL     Total Cholesterol: 226 mg/dL  Eats fast food or restaurant prepared food usually - plans on trying to adjust diet, and prepare more food at home.    Multiple sclerosis: Followed by Dr. Anne Hahn, appointment April 7. Takes Tecfidera. Potential option for ophthalmology for prisms with diplopia.  Continue on Tecfidera with monitoring blood work obtained.  Nuvigil has been used to help with fatigue and baclofen for spasms.  44-month follow-up planned.   Foot wound: R dorsal foot wound for months, unknown if wound initially. No fever.   tried peroxide and keeping clean. Soreness in area at times.   Blister on front of ankle few days ago.  New shoe recently.   Patient Active Problem List   Diagnosis Date  Noted  . 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  . Abnormality of gait 04/22/2013  . HTN (hypertension) 06/29/2012  . Multiple sclerosis (HCC) 06/29/2012  . Benign prostatic hyperplasia with urinary obstruction 06/29/2012   Past Medical History:  Diagnosis Date  . Abnormality of gait 04/22/2013  . Asthma   . BPH (benign prostatic hyperplasia)   . Hypertension   . Multiple sclerosis (HCC)   . 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   Past Surgical History:  Procedure Laterality Date  . none     Allergies  Allergen Reactions  . Peanut-Containing Drug Products Shortness Of Breath  . Zanaflex [Tizanidine Hcl] Shortness Of Breath   Prior to Admission medications   Medication Sig Start Date End Date Taking? Authorizing Provider  Armodafinil 150 MG tablet TAKE 1 TABLET(150 MG) BY MOUTH DAILY 11/25/18  Yes York Spaniel, MD  baclofen (LIORESAL) 20 MG tablet TAKE 1 TABLET IN THE MORNING, 1 AT NOON, AND 2 AT NIGHT 01/10/18  Yes York Spaniel, MD  Dimethyl Fumarate (TECFIDERA) 240 MG CPDR Take 1 capsule (240 mg total) by mouth 2 (two) times daily. 01/28/18  Yes York Spaniel, MD  hydrochlorothiazide (MICROZIDE) 12.5  MG capsule Take one capsule by (12.5 mg) by mouth daily 12/30/18  Yes Shade FloodGreene, Prairie Stenberg R, MD  tamsulosin (FLOMAX) 0.4 MG CAPS capsule Take 1 capsule (0.4 mg total) by mouth daily. 07/01/14  Yes Tonye Pearsonoolittle, Robert P, MD   Social History   Socioeconomic History  . Marital status: Divorced    Spouse name: n/a  . Number of children: 3  . Years of education: 3712  . Highest education level: Not on file  Occupational History  . Occupation: disabled/retired 1999-[place]    [place] [employer]  Social Needs  . Financial resource strain: Not on file  . Food insecurity    Worry: Not on file    Inability: Not on file  . Transportation needs    Medical: Not on file    Non-medical: Not on file  Tobacco Use  . Smoking status: Never Smoker  .  Smokeless tobacco: Never Used  Substance and Sexual Activity  . Alcohol use: Yes    Alcohol/week: 0.0 standard drinks    Comment: occasionally wine cooler  . Drug use: No  . Sexual activity: Never  Lifestyle  . Physical activity    Days per week: Not on file    Minutes per session: Not on file  . Stress: Not on file  Relationships  . Social Musicianconnections    Talks on phone: Not on file    Gets together: Not on file    Attends religious service: Not on file    Active member of club or organization: Not on file    Attends meetings of clubs or organizations: Not on file    Relationship status: Not on file  . Intimate partner violence    Fear of current or ex partner: Not on file    Emotionally abused: Not on file    Physically abused: Not on file    Forced sexual activity: Not on file  Other Topics Concern  . Not on file  Social History Narrative   Lives alone, house.  3 children.  Caffeine none.  Green Tea once week.      Review of Systems  Constitutional: Negative for fatigue and unexpected weight change.  Eyes: Negative for visual disturbance (no new symptoms - some diplopia with MS.).  Respiratory: Negative for cough, chest tightness and shortness of breath.   Cardiovascular: Negative for chest pain, palpitations and leg swelling.  Gastrointestinal: Negative for abdominal pain and blood in stool.  Neurological: Negative for dizziness, light-headedness and headaches.       Objective:   Physical Exam Vitals signs reviewed.  Constitutional:      Appearance: He is well-developed.  HENT:     Head: Normocephalic and atraumatic.  Eyes:     Pupils: Pupils are equal, round, and reactive to light.  Neck:     Vascular: No carotid bruit or JVD.  Cardiovascular:     Rate and Rhythm: Normal rate and regular rhythm.     Heart sounds: Normal heart sounds. No murmur.  Pulmonary:     Effort: Pulmonary effort is normal.     Breath sounds: Normal breath sounds. No rales.   Musculoskeletal:       Feet:  Skin:    General: Skin is warm and dry.  Neurological:     Mental Status: He is alert and oriented to person, place, and time.    Vitals:   01/01/19 0931  BP: (!) 144/83  Pulse: 82  Temp: 97.9 F (36.6 C)  TempSrc: Oral  SpO2: 96%  Height: 6\' 3"  (1.905 m)   Dg Foot Complete Right  Result Date: 01/01/2019 CLINICAL DATA:  Nonhealing RIGHT foot ulcer. EXAM: RIGHT FOOT COMPLETE - 3+ VIEW COMPARISON:  None. FINDINGS: No radiographic evidence of acute osteomyelitis noted. Diffuse soft tissue swelling is present. No acute fracture, subluxation or dislocation noted. No focal bony lesions are identified. IMPRESSION: Soft tissue swelling without acute bony abnormality. Electronically Signed   By: Margarette Canada M.D.   On: 01/01/2019 11:19        Assessment & Plan:    Randy Shaw is a 58 y.o. male Hyperlipidemia, unspecified hyperlipidemia type - Plan: Lipid panel, Comprehensive metabolic panel  -Check lipids to determine statin need/ASCVD risk.  Plans on diet changes, with less fast food/restaurant food.  Essential hypertension  -Slightly elevated/borderline in office.  Plans on decreasing sodium intake, fast food/restaurant food as above.  Continue same regimen for now with close monitoring.  Blister  -Possible friction blister on anterior ankle from new shoe wear.  Padding around area discussed as well as blister care/RTC precautions.   Open wound of right foot with complication, initial encounter - Plan: WOUND CULTURE, Ambulatory referral to Podiatry Ulcer of right foot, unspecified ulcer stage ([place]) - Plan: DG Foot Complete Right, WOUND CULTURE, cephALEXin (KEFLEX) 500 MG capsule, Ambulatory referral to Podiatry  -X-ray without signs of osteomyelitis and no bony tenderness.  Refer to podiatry for evaluation and treatment, start Keflex for now to cover for infection/open wound.  Culture obtained.  RTC precautions if acute worsening  Meds ordered  this encounter  Medications  . cephALEXin (KEFLEX) 500 MG capsule    Sig: Take 1 capsule (500 mg total) by mouth 2 (two) times daily.    Dispense:  20 capsule    Refill:  0   Patient Instructions    Keep follow up with neurology, but if worse double vision - they did mention meeting with ophthalmology for possible prisms.   Blood pressure was borderline high today.  Okay to continue same medications if you can change salt in the diet.  Try to avoid fast food, restaurant food and prepare more meals at home if possible.  Recheck 1 month.  Can also check cholesterol levels today but depending on diet changes that may also improved without medication.  Can discuss further at 1 month follow-up  See information below on blisters.  Keep area clean and covered, and padding over the area that has formed a blister.  I will refer you to podiatry for the ulcer but let you know if there are concerns on your x-ray.  For now start antibiotic Keflex 1 pill twice per day.  If any new redness swelling or pain around area be seen right away.   Blisters, Adult  A blister is a raised bubble of skin filled with liquid. Blisters often develop in an area of the skin that repeatedly rubs or presses against another surface (friction blister). Friction blisters can occur on any part of the body, but they usually develop on the hands or feet. Long-term pressure on the same area of the skin can also lead to areas of hardened skin (calluses). What are the causes? A blister can be caused by:  An injury.  A burn.  An allergic reaction.  An infection.  Exposure to irritating chemicals.  Friction, especially in an area with a lot of heat and moisture. Friction blisters often result from:  Sports.  Repetitive activities.  Using tools and  doing other activities without wearing gloves.  Shoes that are too tight or too loose. What are the signs or symptoms? A blister is often round and looks like a bump.  It may:  Itch.  Be painful to the touch. Before a blister forms, the skin may:  Become red.  Feel warm.  Itch.  Be painful to the touch. How is this diagnosed? A blister is diagnosed with a physical exam. How is this treated? Treatment usually involves protecting the area where the blister has formed until the skin has healed. Other treatments may include:  A bandage (dressing) to cover the blister.  Extra padding around and over the blister, so that it does not rub on anything.  Antibiotic ointment. Most blisters break open, dry up, and go away on their own within 1-2 weeks. Blisters that are very painful may be drained before they break open on their own. If the blister is large or painful, it can be drained by: 1. Sterilizing a small needle with rubbing alcohol. 2. Washing your hands with soap and water. 3. Inserting the needle in the edge of the blister to make a small hole. Some fluid will drain out of the hole. Let the top or roof of the blister stay in place. This helps the skin heal. 4. Washing the blister with mild soap and water. 5. Covering the blister with antibiotic ointment, if prescribed by your health care provider, and a dressing. Some blisters may need to be drained by a health care provider. Follow these instructions at home:  Protect the area where the blister has formed as told by your health care provider.  Keep your blister clean and dry. This helps to prevent infection.  Do not pop your blister. This can cause infection.  If you were prescribed an antibiotic, use it as told by your health care provider. Do not stop using the antibiotic even if your condition improves.  Wear different shoes until the blister heals.  Avoid the activity that caused the blister until your blister heals.  Check your blister every day for signs of infection. Check for: ? More redness, swelling, or pain. ? More fluid or blood. ? Warmth. ? Pus or a bad smell. ? The  blister getting better and then getting worse. How is this prevented? Taking these steps can help to prevent blisters that are caused by friction. Make sure you:  Wear comfortable shoes that fit well.  Always wear socks with shoes.  Wear extra socks or use tape, bandages, or pads over blister-prone areas as needed. You may also apply petroleum jelly under bandages in blister-prone areas.  Wear protective gear, such as gloves, when participating in sports or activities that can cause blisters.  Wear loose-fitting, moisture-wicking clothes when participating in sports or activities.  Use powders as needed to keep your feet dry. Contact a health care provider if:  You have more redness, swelling, or pain around your blister.  You have more fluid or blood coming from your blister.  Your blister feels warm to the touch.  You have pus or a bad smell coming from your blister.  You have a fever or chills.  Your blister gets better and then it gets worse. This information is not intended to replace advice given to you by your health care provider. Make sure you discuss any questions you have with your health care provider. Document Released: 05/25/2004 Document Revised: 03/30/2017 Document Reviewed: 10/29/2015 Elsevier Patient Education  2020 [employer]  If you have lab work done today you will be contacted with your lab results within the next 2 weeks.  If you have not heard from us then please contact us. The fastest way to get your results is to register for My Chart.   IF you received an x-ray today, you will receive an invoice from [facility]. Please contact [facility] at [phone] with questions or concerns regarding your invoice.   IF you received labwork today, you will receive an invoice from [facility]. Please contact [facility] at [phone] with questions or concerns regarding your invoice.   Our billing staff will not be able to assist you with  questions regarding bills from these companies.  You will be contacted with the lab results as soon as they are available. The fastest way to get your results is to activate your My Chart account. Instructions are located on the last page of this paperwork. If you have not heard from us regarding the results in 2 weeks, please contact this office.       Signed,   Meredith StaggersJeffrey Leandrea Ackley, MD [facility].  01/01/19 8:21 PM

## 2019-01-04 LAB — WOUND CULTURE

## 2019-01-08 ENCOUNTER — Other Ambulatory Visit: Payer: Self-pay

## 2019-01-08 ENCOUNTER — Ambulatory Visit (INDEPENDENT_AMBULATORY_CARE_PROVIDER_SITE_OTHER): Payer: BC Managed Care – PPO | Admitting: Podiatry

## 2019-01-08 ENCOUNTER — Encounter: Payer: Self-pay | Admitting: Podiatry

## 2019-01-08 VITALS — BP 122/81 | HR 98

## 2019-01-08 DIAGNOSIS — G35 Multiple sclerosis: Secondary | ICD-10-CM

## 2019-01-08 DIAGNOSIS — L97512 Non-pressure chronic ulcer of other part of right foot with fat layer exposed: Secondary | ICD-10-CM | POA: Diagnosis not present

## 2019-01-08 MED ORDER — SANTYL 250 UNIT/GM EX OINT: 1.0000 "application " | TOPICAL_OINTMENT | Freq: Every day | CUTANEOUS | 1 refills | Status: DC

## 2019-01-09 ENCOUNTER — Ambulatory Visit: Payer: BC Managed Care – PPO | Admitting: Family Medicine

## 2019-01-10 ENCOUNTER — Telehealth: Payer: Self-pay | Admitting: *Deleted

## 2019-01-11 NOTE — Progress Notes (Signed)
   HPI: 58 y.o. wheelchair bound male with PMHx of multiple sclerosis presenting today as a new patient with a chief complaint of an ulceration located on the dorsal left foot that appeared about three weeks ago. He reports associated clear drainage from the wound. He was seen at an urgent care center one week ago and was prescribed Keflex 500 mg BID which he reports taking as directed. He denies any worsening factors. He denies h/o diabetes mellitus. Patient is here for further evaluation and treatment.   Past Medical History:  Diagnosis Date  . Abnormality of gait 04/22/2013  . Asthma   . BPH (benign prostatic hyperplasia)   . Hypertension   . Multiple sclerosis ([place])   . Quadriplegia and quadriparesis ([place]) 03/08/2016     Physical Exam: General: The patient is alert and oriented x3 in no acute distress.  Dermatology: Wound #1 noted to the left dorsal foot measuring 1.0 x 1.0 x 0.7 cm.   To the above-noted ulceration, there is no eschar. There is a moderate amount of slough, fibrin and necrotic tissue. Granulation tissue and wound base is red. There is no malodor. There is a minimal amount of serosanginous drainage noted. Periwound integrity is intact.  Skin is warm, dry and supple bilateral lower extremities.   Vascular: Palpable pedal pulses bilaterally. No edema or erythema noted. Capillary refill within normal limits.  Neurological: Epicritic and protective threshold grossly intact bilaterally.   Musculoskeletal Exam: Range of motion within normal limits to all pedal and ankle joints bilateral. Muscle strength 5/5 in all groups bilateral.   Assessment: 1. Ulceration noted to the left dorsal foot   Plan of Care:  1. Patient evaluated. X-Rays from Epic reviewed.  2. Medically necessary excisional debridement including subcutaneous tissue was performed using a tissue nipper and a chisel blade. Excisional debridement of all the necrotic nonviable tissue down to healthy bleeding  viable tissue was performed with post-debridement measurements same as pre-. 3. The wound was cleansed and dry sterile dressing applied. 4. Culture taken from wound.  5. Continue taking Keflex 500 mg BID as prescribed at urgent care.  6. Prescription for Santyl ointment provided to patient to use daily.  7. Return to clinic in 3 weeks.      [name], DPM [facility]  Dr. [name], DPM    [address], [place] [address]                Office [phone]  Fax [phone]

## 2019-01-13 NOTE — Telephone Encounter (Signed)
[facility] request measurements on 01/10/2019, faxed PA request which required measurements on 01/13/2019. 01/08/2019 clinicals are available today and are provided for [facility] to pre-cert. Faxed to [facility] [phone].

## 2019-01-14 LAB — WOUND CULTURE
MICRO NUMBER:: 862088
SPECIMEN QUALITY:: ADEQUATE

## 2019-01-15 ENCOUNTER — Other Ambulatory Visit: Payer: Self-pay | Admitting: Neurology

## 2019-01-15 ENCOUNTER — Telehealth: Payer: Self-pay | Admitting: *Deleted

## 2019-01-15 NOTE — Telephone Encounter (Signed)
[place] asked for number of wounds and the measurement. I informed there was one wound and measured 1.0 x 1.0 x 0.7cm.

## 2019-01-15 NOTE — Telephone Encounter (Signed)
[facility] request number of wounds and measurements.

## 2019-01-20 ENCOUNTER — Other Ambulatory Visit: Payer: Self-pay

## 2019-01-20 MED ORDER — DIMETHYL FUMARATE 240 MG PO CPDR: 1.0000 | DELAYED_RELEASE_CAPSULE | Freq: Two times a day (BID) | ORAL | 11 refills | Status: DC

## 2019-01-29 ENCOUNTER — Ambulatory Visit: Payer: BC Managed Care – PPO | Admitting: Podiatry

## 2019-01-31 ENCOUNTER — Other Ambulatory Visit: Payer: Self-pay

## 2019-01-31 ENCOUNTER — Encounter: Payer: Self-pay | Admitting: Family Medicine

## 2019-01-31 ENCOUNTER — Ambulatory Visit (INDEPENDENT_AMBULATORY_CARE_PROVIDER_SITE_OTHER): Payer: BC Managed Care – PPO | Admitting: Family Medicine

## 2019-01-31 VITALS — BP 132/72 | HR 82 | Temp 98.0°F | Resp 16 | Ht 75.0 in | Wt 210.0 lb

## 2019-01-31 DIAGNOSIS — I10 Essential (primary) hypertension: Secondary | ICD-10-CM | POA: Diagnosis not present

## 2019-01-31 DIAGNOSIS — E785 Hyperlipidemia, unspecified: Secondary | ICD-10-CM | POA: Diagnosis not present

## 2019-01-31 DIAGNOSIS — L97512 Non-pressure chronic ulcer of other part of right foot with fat layer exposed: Secondary | ICD-10-CM | POA: Diagnosis not present

## 2019-01-31 NOTE — Patient Instructions (Addendum)
   Keep up the good work with avoiding salt in the diet.  Avoid fried foods, fatty foods, watch portion control and recheck cholesterol levels in the next 6 months.  If still elevated would recommend statin medication at that time.  Continue to keep the wound clean/covered and follow-up with podiatry on Monday.  I would recommend discussing with your pharmacy whether or not they can transfer that medicine to another pharmacy or deliver it to make it easier to obtain.  If there is still difficulty obtaining that ointment, please discuss alternatives with Dr. Amalia Hailey on Monday.  Thank you for coming in today and take care.  If you have lab work done today you will be contacted with your lab results within the next 2 weeks.  If you have not heard from [place] then please contact us. The fastest way to get your results is to register for My Chart.   IF you received an x-ray today, you will receive an invoice from [facility]. Please contact [facility] at [phone] with questions or concerns regarding your invoice.   IF you received labwork today, you will receive an invoice from [place]. Please contact [facility] at [phone] with questions or concerns regarding your invoice.   Our billing staff will not be able to assist you with questions regarding bills from these companies.  You will be contacted with the lab results as soon as they are available. The fastest way to get your results is to activate your My Chart account. Instructions are located on the last page of this paperwork. If you have not heard from [place] regarding the results in 2 weeks, please contact this office.

## 2019-01-31 NOTE — Progress Notes (Signed)
Subjective:    Patient ID: Randy Shaw, male    DOB: 1960-12-22, 58 y.o.   MRN: [id number]  HPI Randy Shaw is a 58 y.o. male Presents today for: Chief Complaint  Patient presents with   Chronic Conditions    1 month follow-up    R foot wound/ulcer: With MRSA.  Started on Keflex, referred to podiatry for evaluation.  X-ray without sign of osteomyelitis, no bony ttp   Saw Dr. Logan Bores with Podiatry. Debrided. Rx for collagenase, did not fill as trouble getting that filled as only at one pharmacy - some difficulty with transportation to pharmacy. Does have home delivery available.  Feels like ulcer is getting better.  appt with podiatry in 3 days.   Hypertension: BP Readings from Last 3 Encounters:  01/31/19 132/72  01/08/19 122/81  01/01/19 (!) 144/83   Lab Results  Component Value Date   CREATININE 0.90 01/01/2019  Borderline next visit, decrease sodium intake discussed.  Continue same regimen. Has cut back on sodium. Less chips, more water.  Hyperlipidemia:  Lab Results  Component Value Date   CHOL 233 (H) 01/01/2019   HDL 67 01/01/2019   LDLCALC 149 (H) 01/01/2019   TRIG 98 01/01/2019   CHOLHDL 3.5 01/01/2019   Lab Results  Component Value Date   ALT 15 01/01/2019   AST 12 01/01/2019   ALKPHOS 79 01/01/2019   BILITOT 0.3 01/01/2019   The 10-year ASCVD risk score Denman George DC Jr., et al., 2013) is: 13.1%   Values used to calculate the score:     Age: 86 years     Sex: Male     Is Non-Hispanic African American: Yes     Diabetic: No     Tobacco smoker: No     Systolic Blood Pressure: 132 mmHg     Is BP treated: Yes     HDL Cholesterol: 67 mg/dL     Total Cholesterol: 233 mg/dL Would like to try dietary changes with repeat testing, deffered statin.     Patient Active Problem List   Diagnosis Date Noted   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   Abnormality of gait 04/22/2013   HTN (hypertension) 06/29/2012   Multiple sclerosis (HCC)  06/29/2012   Benign prostatic hyperplasia with urinary obstruction 06/29/2012   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   Past Surgical History:  Procedure Laterality Date   none     Allergies  Allergen Reactions   Peanut-Containing Drug Products Shortness Of Breath   Zanaflex [Tizanidine Hcl] Shortness Of Breath   Prior to Admission medications   Medication Sig Start Date End Date Taking? Authorizing Provider  Armodafinil 150 MG tablet TAKE 1 TABLET(150 MG) BY MOUTH DAILY 11/25/18  Yes [place] Spaniel, MD  baclofen (LIORESAL) 20 MG tablet TAKE 1 TABLET BY MOUTH IN THE MORNING, 1 TABLET AT NOON, AND 2 TABLETS AT NIGHT 01/15/19  Yes [place] Spaniel, MD  Dimethyl Fumarate (TECFIDERA) 240 MG CPDR Take 1 capsule (240 mg total) by mouth 2 (two) times daily. 01/20/19  Yes [place] Spaniel, MD  hydrochlorothiazide (MICROZIDE) 12.5 MG capsule Take one capsule by (12.5 mg) by mouth daily 01/01/19  Yes Shade Flood, MD  tamsulosin (FLOMAX) 0.4 MG CAPS capsule Take 1 capsule (0.4 mg total) by mouth  daily. 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   Food insecurity    Worry: Not on file    Inability: Not on file   Transportation needs    Medical: Not on file    Non-medical: Not on file  Tobacco Use   Smoking status: Never Smoker   Smokeless tobacco: Never Used  Substance and Sexual Activity   Alcohol use: Yes    Alcohol/week: 0.0 standard drinks    Comment:  occasionally wine cooler   Drug use: No   Sexual activity: Never  Lifestyle   Physical activity    Days per week: Not on file    Minutes per session: Not on file   Stress: Not on file  Relationships   Social connections    Talks on phone: Not on file    Gets together: Not on file    Attends religious service: Not on file    Active member of club or organization: Not on file    Attends meetings of clubs or organizations: Not on file    Relationship status: Not on file   Intimate partner violence    Fear of current or ex partner: Not on file    Emotionally abused: Not on file    Physically abused: Not on file    Forced sexual activity: Not on file  Other Topics Concern   Not on file  Social History Narrative   Lives alone, house.  3 children.  Caffeine none.  Green Tea once week.      Review of Systems     Objective:   Physical Exam Vitals signs reviewed.  Constitutional:      General: He is not in acute distress.    Appearance: He is well-developed.  HENT:     Head: Normocephalic and atraumatic.  Cardiovascular:     Rate and Rhythm: Normal rate.  Pulmonary:     Effort: Pulmonary effort is normal.  Musculoskeletal:       Feet:  Neurological:     Mental Status: He is alert and oriented to person, place, and time.       Assessment & Plan:  Randy Shaw is a 58 y.o. male Hyperlipidemia, unspecified hyperlipidemia type  -ASCVD risk were discussed as well as recommendations for statin.  Would like to try diet approach initially.  Recheck levels in 6 months.  Essential hypertension  -Stable, commended on dietary changes.  Recheck 6 months.  Ulcer of right foot with fat layer exposed ([place])  -Stable, but still appreciable ulcer.  No apparent sign of infection at this time.  Difficulty obtaining previous ointment from podiatry.  Recommended discussion with pharmacy to see if transfer available or home delivery.  If not, can discuss alternatives with podiatry  as planned appt on Monday.  No orders of the defined types were placed in this encounter.  Patient Instructions     Keep up the good work with avoiding salt in the diet.  Avoid fried foods, fatty foods, watch portion control and recheck cholesterol levels in the next 6 months.  If still elevated would recommend statin medication at  that time.  Continue to keep the wound clean/covered and follow-up with podiatry on Monday.  I would recommend discussing with your pharmacy whether or not they can transfer that medicine to another pharmacy or deliver it to make it easier to obtain.  If there is still difficulty obtaining that ointment, please discuss alternatives with Dr. Logan Bores on Monday.  Thank you for coming in today and take care.  If you have lab work done today you will be contacted with your lab results within the next 2 weeks.  If you have not heard from [place] then please contact us. The fastest way to get your results is to register for My Chart.   IF you received an x-ray today, you will receive an invoice from [facility]. Please contact [facility] at [phone] with questions or concerns regarding your invoice.   IF you received labwork today, you will receive an invoice from [facility]. Please contact [facility] at [phone] with questions or concerns regarding your invoice.   Our billing staff will not be able to assist you with questions regarding bills from these companies.  You will be contacted with the lab results as soon as they are available. The fastest way to get your results is to activate your My Chart account. Instructions are located on the last page of this paperwork. If you have not heard from [place] regarding the results in 2 weeks, please contact this office.       Signed,   Meredith Staggers, MD Primary Care at [facility].  01/31/19 12:08 PM

## 2019-02-03 ENCOUNTER — Ambulatory Visit (INDEPENDENT_AMBULATORY_CARE_PROVIDER_SITE_OTHER): Payer: BC Managed Care – PPO | Admitting: Podiatry

## 2019-02-03 ENCOUNTER — Other Ambulatory Visit: Payer: Self-pay

## 2019-02-03 VITALS — Temp 98.2°F

## 2019-02-03 DIAGNOSIS — L97512 Non-pressure chronic ulcer of other part of right foot with fat layer exposed: Secondary | ICD-10-CM

## 2019-02-03 DIAGNOSIS — G35 Multiple sclerosis: Secondary | ICD-10-CM

## 2019-02-04 ENCOUNTER — Telehealth: Payer: Self-pay | Admitting: *Deleted

## 2019-02-04 DIAGNOSIS — G35 Multiple sclerosis: Secondary | ICD-10-CM

## 2019-02-04 DIAGNOSIS — L97512 Non-pressure chronic ulcer of other part of right foot with fat layer exposed: Secondary | ICD-10-CM

## 2019-02-04 NOTE — Telephone Encounter (Signed)
Prepared required form, available clinicals, and demographics to be faxed to [facility] once 02/03/2019 clinicals are available.

## 2019-02-04 NOTE — Telephone Encounter (Signed)
-----   Message from [name], DPM sent at 02/03/2019  4:25 PM EDT ----- Regarding: referral to [facility] wound care center Please refer to [place]  Dx: ulcer RT foot   - Dr. Amalia Hailey

## 2019-02-04 NOTE — Progress Notes (Signed)
PATIENT: Randy Shaw DOB: 1961/04/11  REASON FOR VISIT: follow up HISTORY FROM: patient  HISTORY OF PRESENT ILLNESS: Today 02/05/19  Randy Shaw is a 58 year old male with history of multiple sclerosis.  He remains on Tecfidera.  He gets around with a motorized wheelchair.  He last had MRI of the brain and cervical spine in March 2019, did not show any change from prior. He has a documented left INO, and has double vision when looking to the right.  He remains on baclofen and Nuvigil.  Nuvigil has been beneficial for his fatigue.  He continues to live alone.  He is able to stand and walk short distances if he can hold on to something.  He had a recent fall, where his legs became weak and gave way, he was not injured.  He denies any changes to his bowels or bladder.  He denies any new numbness or weakness to his arms or legs.  He was brought to this appointment by [facility] transportation.  He denies any new problems or concerns.  He presents today for follow-up unaccompanied.  HISTORY  08/06/2018 Dr. Anne Hahn: Randy Shaw is a 58 year old right-handed black male with a history of multiple sclerosis.  The patient is on Tecfidera, he is tolerating the medication well.  He uses a motorized wheelchair to get around, he lives alone.  Uses [facility] for transportation.  He is able to walk short distances by holding onto walls, he has not had any falls.  He believes that the double vision is bothering him more, he has a well-documented left INO, he has more prominent double vision when looking to the right.  He has not seen an ophthalmologist in quite some time.  He reports no new numbness or weakness of the face, arms, legs.  He denies any new changes with bowel bladder control.  He is otherwise doing well, no other new medical issues have come up.  REVIEW OF SYSTEMS: Out of a complete 14 system review of symptoms, the patient complains only of the following symptoms, and all other reviewed systems are negative:  Gait abnormality  ALLERGIES: Allergies  Allergen Reactions  . Peanut-Containing Drug Products Shortness Of Breath  . Zanaflex [Tizanidine Hcl] Shortness Of Breath    HOME MEDICATIONS: Outpatient Medications Prior to Visit  Medication Sig Dispense Refill  . baclofen (LIORESAL) 20 MG tablet TAKE 1 TABLET BY MOUTH IN THE MORNING, 1 TABLET AT NOON, AND 2 TABLETS AT NIGHT 120 tablet 11  . Dimethyl Fumarate (TECFIDERA) 240 MG CPDR Take 1 capsule (240 mg total) by mouth 2 (two) times daily. 60 capsule 11  . hydrochlorothiazide (MICROZIDE) 12.5 MG capsule Take one capsule by (12.5 mg) by mouth daily 90 capsule 1  . tamsulosin (FLOMAX) 0.4 MG CAPS capsule Take 1 capsule (0.4 mg total) by mouth daily. 30 capsule 0  . Armodafinil 150 MG tablet TAKE 1 TABLET(150 MG) BY MOUTH DAILY 90 tablet 1   Facility-Administered Medications Prior to Visit  Medication Dose Route Frequency Provider Last Rate Last Dose  . gadopentetate dimeglumine (MAGNEVIST) injection 20 mL  20 mL Intravenous Once PRN [place] Spaniel, MD        PAST MEDICAL HISTORY: Past Medical History:  Diagnosis Date  . Abnormality of gait 04/22/2013  . Asthma   . BPH (benign prostatic hyperplasia)   . Hypertension   . Multiple sclerosis (HCC)   . 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    PAST SURGICAL HISTORY: Past Surgical History:  Procedure Laterality Date  . none      FAMILY HISTORY: Family History  Problem Relation Age of Onset  . Cancer Mother        breast  . Stroke Father   . Cancer - Colon Father   . Multiple sclerosis Neg Hx     SOCIAL HISTORY: Social History   Socioeconomic History  . Marital status: Divorced    Spouse name: n/a  . Number of children: 3  . Years of education: 50  . Highest education level: Not on file  Occupational History  . Occupation: disabled/retired 1999-[place]    Comment: Education officer, museum  Social Needs  . Financial resource strain: Not on file  . Food  insecurity    Worry: Not on file    Inability: Not on file  . Transportation needs    Medical: Not on file    Non-medical: Not on file  Tobacco Use  . Smoking status: Never Smoker  . Smokeless tobacco: Never Used  Substance and Sexual Activity  . Alcohol use: Yes    Alcohol/week: 0.0 standard drinks    Comment: occasionally wine cooler  . Drug use: No  . Sexual activity: Never  Lifestyle  . Physical activity    Days per week: Not on file    Minutes per session: Not on file  . Stress: Not on file  Relationships  . Social Herbalist on phone: Not on file    Gets together: Not on file    Attends religious service: Not on file    Active member of club or organization: Not on file    Attends meetings of clubs or organizations: Not on file    Relationship status: Not on file  . Intimate partner violence    Fear of current or ex partner: Not on file    Emotionally abused: Not on file    Physically abused: Not on file    Forced sexual activity: Not on file  Other Topics Concern  . Not on file  Social History Narrative   Lives alone, house.  3 children.  Caffeine none.  Green Tea once week.        PHYSICAL EXAM  Vitals:   02/05/19 1117  BP: 118/82  Pulse: 82  Temp: (!) 95.9 F (35.5 C)  Weight: 210 lb (95.3 kg)  Height: 6\' 3"  (1.905 m)   Body mass index is 26.25 kg/m.  Generalized: Well developed, in no acute distress, in wheelchair  Neurological examination  Mentation: Alert oriented to time, place, history taking. Follows all commands speech and language fluent Cranial nerve II-XII: Pupils were equal round reactive to light. Extraocular movements were full with exception of incomplete adduction of the left eye with looking to the right, he has a left INO. Visual field were full on confrontational test. Facial sensation and strength were normal.  Head turning and shoulder shrug were normal and symmetric. Motor: The motor testing reveals good strength with  the exception of the left leg 3/5, difficulty with dorsiflexion, increased motor tone in the left greater than the right extremity Sensory: Sensory testing is intact to soft touch on all 4 extremities. No evidence of extinction is noted.  Coordination: Cerebellar testing reveals good finger-nose-finger, he is not able to perform heel-to-shin on the left Gait and station: He was not ambulated, he is in a motorized wheelchair Reflexes: Deep tendon reflexes are symmetric    DIAGNOSTIC DATA (LABS, IMAGING, TESTING) - I reviewed patient  records, labs, notes, testing and imaging myself where available.  Lab Results  Component Value Date   WBC 6.4 08/13/2018   HGB 14.1 08/13/2018   HCT 42.7 08/13/2018   MCV 83 08/13/2018   PLT 229 08/13/2018      Component Value Date/Time   NA 140 01/01/2019 1107   K 4.1 01/01/2019 1107   CL 101 01/01/2019 1107   CO2 24 01/01/2019 1107   GLUCOSE 98 01/01/2019 1107   GLUCOSE 96 07/30/2013 0952   BUN 14 01/01/2019 1107   CREATININE 0.90 01/01/2019 1107   CREATININE 0.89 07/30/2013 0952   CALCIUM 10.0 01/01/2019 1107   PROT 7.1 01/01/2019 1107   ALBUMIN 4.4 01/01/2019 1107   AST 12 01/01/2019 1107   ALT 15 01/01/2019 1107   ALKPHOS 79 01/01/2019 1107   BILITOT 0.3 01/01/2019 1107   GFRNONAA 93 01/01/2019 1107   GFRNONAA >89 07/30/2013 0952   GFRAA 108 01/01/2019 1107   GFRAA >89 07/30/2013 0952   Lab Results  Component Value Date   CHOL 233 (H) 01/01/2019   HDL 67 01/01/2019   LDLCALC 149 (H) 01/01/2019   TRIG 98 01/01/2019   CHOLHDL 3.5 01/01/2019   Lab Results  Component Value Date   HGBA1C 5.6 02/21/2018   Lab Results  Component Value Date   VITAMINB12 343 07/06/2017   Lab Results  Component Value Date   TSH 3.570 07/06/2017    ASSESSMENT AND PLAN 58 y.o. year old male  has a past medical history of Abnormality of gait (04/22/2013), Asthma, BPH (benign prostatic hyperplasia), Hypertension, Multiple sclerosis (HCC), and  Quadriplegia and quadriparesis (HCC) (03/08/2016). here with:  1.  Multiple sclerosis 2.  Quadriparesis, left sided weakness 3.  Gait disorder 4.  Left INO, double vision  He has continued to do well since last visit.  He will remain on Tecfidera, baclofen, and Nuvigil.  He does not need any refills at this time.  I will check a CBC with differential, CMP was done 01/01/2019 and was unremarkable.  We will check MRI of the brain and cervical spine at next visit.  I did provide him with a printed DME prescription for a home life alert device.  I suggested he follow-up with an eye doctor, to determine if prisms may be beneficial for his reported double vision.  He will return in 6 months or sooner if needed.  I did advise if symptoms worsen or if he develops any new symptoms he should let us know.  I spent 15 minutes with the patient. 50% of this time was spent discussing his plan of care.  Margie EgeSarah Kynsley Whitehouse, AGNP-C, DNP 02/05/2019, 11:22 AM [facility] [address], [address] [place], [place] [address] [phone]

## 2019-02-04 NOTE — Addendum Note (Signed)
Addended by: Harriett Sine D on: 02/04/2019 08:34 AM   Modules accepted: Orders

## 2019-02-05 ENCOUNTER — Encounter: Payer: Self-pay | Admitting: Neurology

## 2019-02-05 ENCOUNTER — Ambulatory Visit (INDEPENDENT_AMBULATORY_CARE_PROVIDER_SITE_OTHER): Payer: BC Managed Care – PPO | Admitting: Neurology

## 2019-02-05 ENCOUNTER — Other Ambulatory Visit: Payer: Self-pay

## 2019-02-05 VITALS — BP 118/82 | HR 82 | Temp 95.9°F | Ht 75.0 in | Wt 210.0 lb

## 2019-02-05 DIAGNOSIS — G35 Multiple sclerosis: Secondary | ICD-10-CM | POA: Diagnosis not present

## 2019-02-05 DIAGNOSIS — R269 Unspecified abnormalities of gait and mobility: Secondary | ICD-10-CM | POA: Diagnosis not present

## 2019-02-05 NOTE — Progress Notes (Signed)
I have read the note, and I agree with the clinical assessment and plan.  Alric K Amandeep Hogston   

## 2019-02-05 NOTE — Patient Instructions (Signed)
1. I will check CBC today, CMP was done in September and was normal  2. Continue Tecfidera, Baclofen, Nuvigil  3. Return in 6 months

## 2019-02-06 ENCOUNTER — Telehealth: Payer: Self-pay | Admitting: *Deleted

## 2019-02-06 LAB — CBC WITH DIFFERENTIAL/PLATELET
Basophils Absolute: 0.1 10*3/uL (ref 0.0–0.2)
Basos: 1 %
EOS (ABSOLUTE): 0.2 10*3/uL (ref 0.0–0.4)
Eos: 4 %
Hematocrit: 43.8 % (ref 37.5–51.0)
Hemoglobin: 14.3 g/dL (ref 13.0–17.7)
Immature Grans (Abs): 0 10*3/uL (ref 0.0–0.1)
Immature Granulocytes: 0 %
Lymphocytes Absolute: 1.7 10*3/uL (ref 0.7–3.1)
Lymphs: 29 %
MCH: 27.1 pg (ref 26.6–33.0)
MCHC: 32.6 g/dL (ref 31.5–35.7)
MCV: 83 fL (ref 79–97)
Monocytes Absolute: 0.5 10*3/uL (ref 0.1–0.9)
Monocytes: 9 %
Neutrophils Absolute: 3.2 10*3/uL (ref 1.4–7.0)
Neutrophils: 57 %
Platelets: 227 10*3/uL (ref 150–450)
RBC: 5.28 x10E6/uL (ref 4.14–5.80)
RDW: 12.7 % (ref 11.6–15.4)
WBC: 5.6 10*3/uL (ref 3.4–10.8)

## 2019-02-06 NOTE — Progress Notes (Signed)
   HPI: 58 y.o. wheelchair bound male with PMHx of multiple sclerosis presenting today for follow up evaluation of an ulceration of the left dorsal foot. He states the wound is relatively unchanged but is no worse. He states he has finished taking the Keflex as directed but was unable to get the [facility] because the pharmacy did not have it. He denies any modifying factors. Patient is here for further evaluation and treatment.    Past Medical History:  Diagnosis Date  . Abnormality of gait 04/22/2013  . Asthma   . BPH (benign prostatic hyperplasia)   . Hypertension   . Multiple sclerosis ([place])   . Quadriplegia and quadriparesis ([place]) 03/08/2016     Physical Exam: General: The patient is alert and oriented x3 in no acute distress.  Dermatology: Wound #1 noted to the left dorsal foot measuring 1.0 x 1.0 x 0.5 cm.   To the above-noted ulceration, there is no eschar. There is a moderate amount of slough, fibrin and necrotic tissue. Granulation tissue and wound base is red. There is no malodor. There is a minimal amount of serosanginous drainage noted. Periwound integrity is intact.  Skin is warm, dry and supple bilateral lower extremities.   Vascular: Palpable pedal pulses bilaterally. No edema or erythema noted. Capillary refill within normal limits.  Neurological: Epicritic and protective threshold grossly intact bilaterally.   Musculoskeletal Exam: Range of motion within normal limits to all pedal and ankle joints bilateral. Muscle strength 5/5 in all groups bilateral.   Assessment: 1. Ulceration noted to the left dorsal foot   Plan of Care:  1. Patient evaluated. Cultures reviewed.  2. Medically necessary excisional debridement including subcutaneous tissue was performed using a tissue nipper and a chisel blade. Excisional debridement of all the necrotic nonviable tissue down to healthy bleeding viable tissue was performed with post-debridement measurements same as pre-. 3. The wound  was cleansed and dry sterile dressing applied. 4. Recommended patient gets the [place] from pharmacy.  5. Referral placed to [facility].  6. Return to clinic in 2 weeks.      [name], DPM [facility]  Dr. [name], DPM    [address], [place] [address]                Office [phone]  Fax [phone]

## 2019-02-06 NOTE — Telephone Encounter (Signed)
-----   Message from Suzzanne Cloud, NP sent at 02/06/2019  7:48 AM EDT ----- Labs look good while on Tecfidera. A CMP from September was reviewed.

## 2019-02-06 NOTE — Telephone Encounter (Signed)
I called pt and relayed that per SS/NP that the lab results looked good while on tecfidera. A CMP from September was reviewed as well.  He verbalized understanding.

## 2019-02-10 ENCOUNTER — Telehealth: Payer: Self-pay | Admitting: *Deleted

## 2019-02-10 NOTE — Telephone Encounter (Signed)
Faxed referral form, all clinicals and demographics to [facility].

## 2019-02-10 NOTE — Telephone Encounter (Signed)
Patient was approved for the santyl from [facility] from 02/07/2019 thru 02/05/2022 and the reference number is [id number] and I called the patient and stated that the santyl was at the pharmacy and I went on to tell the patient on how to use the ointment and to use the tongue dispenser when applying to the wound area and to call the office at [phone] if any concerns or questions. Lattie Haw

## 2019-02-17 ENCOUNTER — Ambulatory Visit (INDEPENDENT_AMBULATORY_CARE_PROVIDER_SITE_OTHER): Payer: BC Managed Care – PPO | Admitting: Podiatry

## 2019-02-17 ENCOUNTER — Other Ambulatory Visit: Payer: Self-pay

## 2019-02-17 DIAGNOSIS — B351 Tinea unguium: Secondary | ICD-10-CM

## 2019-02-17 DIAGNOSIS — M79676 Pain in unspecified toe(s): Secondary | ICD-10-CM

## 2019-02-17 DIAGNOSIS — G35 Multiple sclerosis: Secondary | ICD-10-CM

## 2019-02-17 DIAGNOSIS — L97512 Non-pressure chronic ulcer of other part of right foot with fat layer exposed: Secondary | ICD-10-CM

## 2019-02-20 NOTE — Progress Notes (Signed)
   SUBJECTIVE Patient presents to office today complaining of elongated, thickened nails that cause pain while ambulating in shoes. He is unable to trim his own nails.  He is also here with a chief complaint of an ulceration noted to the right dorsal foot that appeared about four days ago. He states he has been applying Santyl for treatment. He states the Santyl has been helping to heal the wound significantly and denies any worsening factors. Patient is here for further evaluation and treatment.  Past Medical History:  Diagnosis Date  . Abnormality of gait 04/22/2013  . Asthma   . BPH (benign prostatic hyperplasia)   . Hypertension   . Multiple sclerosis ([place])   . Quadriplegia and quadriparesis ([place]) 03/08/2016    OBJECTIVE General Patient is awake, alert, and oriented x 3 and in no acute distress.  Derm Wound #1 noted to the right dorsal foot measuring approximately 1.0 x 1.0 x 0.2 cm.   To the above-noted ulceration, there is no eschar. There is a moderate amount of slough, fibrin and necrotic tissue. Granulation tissue and wound base is red. There is no malodor. There is a minimal amount of serosanginous drainage noted. Periwound integrity is intact.  Skin is dry and supple bilateral.  Nails are tender, long, thickened and dystrophic with subungual debris, consistent with onychomycosis, 1-5 bilateral. No signs of infection noted.  Vasc  DP and PT pedal pulses palpable bilaterally. Temperature gradient within normal limits.   Neuro Epicritic and protective threshold sensation grossly intact bilaterally.   Musculoskeletal Exam No symptomatic pedal deformities noted bilateral. Muscular strength within normal limits.   ASSESSMENT 1. Onychodystrophic nails 1-5 bilateral with hyperkeratosis of nails.  2. Onychomycosis of nail due to dermatophyte bilateral 3. Ulceration of the right dorsal foot  PLAN OF CARE 1. Patient evaluated today.  2. Instructed to maintain good pedal hygiene  and foot care.  3. Mechanical debridement of nails 1-5 bilaterally performed using a nail nipper. Filed with dremel without incident.  4. Medically necessary excisional debridement including subcutaneous tissue was performed using a tissue nipper and a chisel blade. Excisional debridement of all the necrotic nonviable tissue down to healthy bleeding viable tissue was performed with post-debridement measurements same as pre-. 5. The wound was cleansed and dry sterile dressing applied. 6. Continue using Santyl daily.  7. Return to clinic in 3 weeks.     [name], DPM [facility]  Dr. [name], [place]                                        [place], [place] [address]                Office [phone]  Fax [phone]

## 2019-02-21 ENCOUNTER — Encounter (HOSPITAL_BASED_OUTPATIENT_CLINIC_OR_DEPARTMENT_OTHER): Payer: BLUE CROSS/BLUE SHIELD | Admitting: Internal Medicine

## 2019-03-04 DIAGNOSIS — H5010 Unspecified exotropia: Secondary | ICD-10-CM | POA: Diagnosis not present

## 2019-03-04 DIAGNOSIS — H52203 Unspecified astigmatism, bilateral: Secondary | ICD-10-CM | POA: Diagnosis not present

## 2019-03-04 DIAGNOSIS — G35 Multiple sclerosis: Secondary | ICD-10-CM | POA: Diagnosis not present

## 2019-03-04 DIAGNOSIS — H524 Presbyopia: Secondary | ICD-10-CM | POA: Diagnosis not present

## 2019-03-10 ENCOUNTER — Other Ambulatory Visit: Payer: Self-pay

## 2019-03-10 ENCOUNTER — Ambulatory Visit (INDEPENDENT_AMBULATORY_CARE_PROVIDER_SITE_OTHER): Payer: BC Managed Care – PPO | Admitting: Podiatry

## 2019-03-10 DIAGNOSIS — L97512 Non-pressure chronic ulcer of other part of right foot with fat layer exposed: Secondary | ICD-10-CM | POA: Diagnosis not present

## 2019-03-10 DIAGNOSIS — G35 Multiple sclerosis: Secondary | ICD-10-CM

## 2019-03-10 MED ORDER — DOXYCYCLINE HYCLATE 100 MG PO TABS: 100.0000 mg | ORAL_TABLET | Freq: Two times a day (BID) | ORAL | 0 refills | Status: DC

## 2019-03-13 NOTE — Progress Notes (Signed)
   HPI: 58 y.o. male presenting today for follow up evaluation of an ulceration of the right dorsal foot. He states the wound seemed to be healing well but he noticed an increase in pain that began this morning. He has been using the Santyl ointment as directed. Touching the area increases the pain. Patient is here for further evaluation and treatment.   Past Medical History:  Diagnosis Date  . Abnormality of gait 04/22/2013  . Asthma   . BPH (benign prostatic hyperplasia)   . Hypertension   . Multiple sclerosis ([place])   . Quadriplegia and quadriparesis ([place]) 03/08/2016     Physical Exam: General: The patient is alert and oriented x3 in no acute distress.  Dermatology: Wound #1 noted to the right dorsal foot measuring 0.8 x 0.8 x 0.2 cm.   To the above-noted ulceration, there is no eschar. There is a moderate amount of slough, fibrin and necrotic tissue. Granulation tissue and wound base is red. There is no malodor. There is a minimal amount of serosanginous drainage noted. Periwound integrity is intact.  Skin is warm, dry and supple bilateral lower extremities.  Vascular: Mildly increased erythema noted to right dorsal foot slightly concerning for cellulitis. Palpable pedal pulses bilaterally. Capillary refill within normal limits.  Neurological: Epicritic and protective threshold grossly intact bilaterally.   Musculoskeletal Exam: Range of motion within normal limits to all pedal and ankle joints bilateral. Muscle strength 5/5 in all groups bilateral.    Assessment: 1. Ulceration of the right dorsal foot    Plan of Care:  1. Patient evaluated.   2. 2. Medically necessary excisional debridement including subcutaneous tissue was performed using a tissue nipper and a chisel blade. Excisional debridement of all the necrotic nonviable tissue down to healthy bleeding viable tissue was performed with post-debridement measurements same as pre-. 3. The wound was cleansed and dry sterile  dressing applied. 4. Continue using Santyl ointment daily.  5. Prescription for Doxycycline 100 mg #20 provided to patient.  6. Return to clinic in 4 weeks.       [name], DPM [facility]  Dr. [name], DPM    [address], [place] [address]                Office [phone]  Fax [phone]

## 2019-04-07 ENCOUNTER — Other Ambulatory Visit: Payer: Self-pay

## 2019-04-07 ENCOUNTER — Encounter: Payer: Self-pay | Admitting: Podiatry

## 2019-04-07 ENCOUNTER — Ambulatory Visit (INDEPENDENT_AMBULATORY_CARE_PROVIDER_SITE_OTHER): Payer: BC Managed Care – PPO | Admitting: Podiatry

## 2019-04-07 DIAGNOSIS — L97512 Non-pressure chronic ulcer of other part of right foot with fat layer exposed: Secondary | ICD-10-CM | POA: Diagnosis not present

## 2019-04-09 NOTE — Progress Notes (Signed)
   HPI: 58 y.o. male presenting today for follow up evaluation of an ulceration of the right dorsal foot. He states he is doing well. He denies any significant pain or modifying factors. He has been using the Santyl ointment as directed. Patient is here for further evaluation and treatment.   Past Medical History:  Diagnosis Date  . Abnormality of gait 04/22/2013  . Asthma   . BPH (benign prostatic hyperplasia)   . Hypertension   . Multiple sclerosis ([place])   . Quadriplegia and quadriparesis ([place]) 03/08/2016     Physical Exam: General: The patient is alert and oriented x3 in no acute distress.  Dermatology: Wound #1 noted to the right dorsal foot measuring 0.5 x 0.6 x 0.1 cm.   To the above-noted ulceration, there is no eschar. There is a moderate amount of slough, fibrin and necrotic tissue. Granulation tissue and wound base is red. There is no malodor. There is a minimal amount of serosanginous drainage noted. Periwound integrity is intact.  Skin is warm, dry and supple bilateral lower extremities.  Vascular: Mildly increased erythema noted to right dorsal foot slightly concerning for cellulitis. Palpable pedal pulses bilaterally. Capillary refill within normal limits.  Neurological: Epicritic and protective threshold grossly intact bilaterally.   Musculoskeletal Exam: Range of motion within normal limits to all pedal and ankle joints bilateral. Muscle strength 5/5 in all groups bilateral.    Assessment: 1. Ulceration of the right dorsal foot    Plan of Care:  1. Patient evaluated.   2. Medically necessary excisional debridement including subcutaneous tissue was performed using a tissue nipper and a chisel blade. Excisional debridement of all the necrotic nonviable tissue down to healthy bleeding viable tissue was performed with post-debridement measurements same as pre-. 3. The wound was cleansed and dry sterile dressing applied. 4. Continue using Santyl ointment daily.  5.  Return to clinic in 6 weeks.       [name], DPM [facility]  Dr. [name], DPM    [address],  [address]                Office [phone]  Fax [phone]

## 2019-05-19 ENCOUNTER — Ambulatory Visit (INDEPENDENT_AMBULATORY_CARE_PROVIDER_SITE_OTHER): Payer: 59 | Admitting: Podiatry

## 2019-05-19 ENCOUNTER — Telehealth: Payer: Self-pay | Admitting: *Deleted

## 2019-05-19 ENCOUNTER — Other Ambulatory Visit: Payer: Self-pay

## 2019-05-19 DIAGNOSIS — L97512 Non-pressure chronic ulcer of other part of right foot with fat layer exposed: Secondary | ICD-10-CM | POA: Diagnosis not present

## 2019-05-19 DIAGNOSIS — G35 Multiple sclerosis: Secondary | ICD-10-CM

## 2019-05-19 MED ORDER — GENTAMICIN SULFATE 0.1 % EX CREA: 1.0000 "application " | TOPICAL_CREAM | Freq: Two times a day (BID) | CUTANEOUS | 1 refills | Status: DC

## 2019-05-19 NOTE — Telephone Encounter (Signed)
PA for Tecfidera started on covermymeds (key: [id number]). Pt has pharmacy coverage w/ [facility] [phone]). Decision pending.

## 2019-05-19 NOTE — Telephone Encounter (Signed)
PA approved through 05/18/2020. [facility] [phone].

## 2019-05-22 NOTE — Progress Notes (Signed)
   HPI: 59 y.o. male presenting today for follow up evaluation of an ulceration of the right dorsal foot. He reports some mild pain that occurred last night but has since resolved. He reports some continued associated drainage. He has been using Santyl ointment as directed. There are no worsening factors noted at this time. Patient is here for further evaluation and treatment.   Past Medical History:  Diagnosis Date  . Abnormality of gait 04/22/2013  . Asthma   . BPH (benign prostatic hyperplasia)   . Hypertension   . Multiple sclerosis ([facility])   . 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     Physical Exam: General: The patient is alert and oriented x3 in no acute distress.  Dermatology: Wound #1 noted to the right dorsal foot measuring 0.5 x 0.6 x 0.1 cm.   To the above-noted ulceration, there is no eschar. There is a moderate amount of slough, fibrin and necrotic tissue. Granulation tissue and wound base is red. There is no malodor. There is a minimal amount of serosanginous drainage noted. Periwound integrity is intact.  Skin is warm, dry and supple bilateral lower extremities.  Vascular: Mildly increased erythema noted to right dorsal foot slightly concerning for cellulitis. Palpable pedal pulses bilaterally. Capillary refill within normal limits.  Neurological: Epicritic and protective threshold grossly intact bilaterally.   Musculoskeletal Exam: Range of motion within normal limits to all pedal and ankle joints bilateral. Muscle strength 5/5 in all groups bilateral.    Assessment: 1. Ulceration of the right dorsal foot    Plan of Care:  1. Patient evaluated.   2. Medically necessary excisional debridement including subcutaneous tissue was performed using a tissue nipper and a chisel blade. Excisional debridement of all the necrotic nonviable tissue down to healthy bleeding viable tissue was performed with post-debridement measurements same as pre-. 3. The wound was  cleansed and dry sterile dressing applied. 4. Discontinue using Santyl. 5. Prescription for Gentamicin cream provided to patient to use daily with a bandage.  6. Return to clinic in 4 weeks. If not better, a referral to [facility] will be sent.        Randy Shaw, DPM [facility]  Dr. Felecia Shaw, DPM    [address] [place], [place] [address]                Office [phone]  Fax [phone]

## 2019-05-28 ENCOUNTER — Telehealth: Payer: Self-pay | Admitting: Neurology

## 2019-05-28 NOTE — Telephone Encounter (Signed)
I called pt and relayed that we do not have samples of generic dimethyl fumarate.  He verbalized understanding.

## 2019-05-28 NOTE — Telephone Encounter (Signed)
Pt called stating that due to insurance reasons he had missed out on his Dimethyl Fumarate (TECFIDERA) 240 MG CPDR and he is wondering if he can get any samples so that he can bridge the gap. Please advise.

## 2019-06-02 ENCOUNTER — Telehealth: Payer: Self-pay | Admitting: Neurology

## 2019-06-02 ENCOUNTER — Telehealth: Payer: Self-pay

## 2019-06-02 NOTE — Telephone Encounter (Signed)
I called, spoke to Dodson, and I relayed that pts allergies are zanaflex and peanuts.  He verbalized understanding.  Nothing else needed.

## 2019-06-02 NOTE — Telephone Encounter (Signed)
Please call [phone] [facility]

## 2019-06-02 NOTE — Telephone Encounter (Signed)
Please contact [facility] for this patient's allergy to medication.

## 2019-06-02 NOTE — Telephone Encounter (Signed)
[phone] [facility]

## 2019-06-02 NOTE — Telephone Encounter (Signed)
Calling to request a list of the patients allergies in order to fill the patients Dimethyl Fumarate (TECFIDERA) 240 MG CPDR   Please follow up

## 2019-06-03 NOTE — Telephone Encounter (Signed)
This message was handled on 06/02/2019 by Joellen Jersey, RN. See separate telephone message.

## 2019-06-05 NOTE — Telephone Encounter (Signed)
Melissa with [facility] called to inform another PA is needed for the patients Tecfidera as the brand name has to have its own PA and if approved they would be able to get patient a copay card and flex payment if MD is willing they could send over forms.   CB#[id number]

## 2019-06-09 ENCOUNTER — Other Ambulatory Visit: Payer: Self-pay | Admitting: Neurology

## 2019-06-09 NOTE — Telephone Encounter (Signed)
PA for Tecfidera has been approved 05/18/2020 per Marcelino Duster RNs note from 05/19/2019.

## 2019-06-11 ENCOUNTER — Telehealth: Payer: Self-pay

## 2019-06-11 NOTE — Telephone Encounter (Signed)
Randy Shaw with [facility] called to request a PA for the brand name of tecidfera states it would be a better cost value to the patient vs. Generic

## 2019-06-12 ENCOUNTER — Other Ambulatory Visit: Payer: Self-pay | Admitting: Neurology

## 2019-06-12 MED ORDER — ARMODAFINIL 150 MG PO TABS: 150.0000 mg | ORAL_TABLET | Freq: Every day | ORAL | 1 refills | Status: DC

## 2019-06-12 NOTE — Telephone Encounter (Signed)
Drug registry checked last fill 03-07-19. Armodafinil.

## 2019-06-12 NOTE — Telephone Encounter (Signed)
Forms were faxed to office to complete PA for Brand name Tecfidera. PA submitted and faxed to the fax number on the [phone]) Will await a response.

## 2019-06-12 NOTE — Telephone Encounter (Signed)
I have called the pharmacy back. They states that PA was covering the generic. States that the Brand name would be better and cost effective and we would have to resubmit for a new PA and request it to be specifically for brand name. Gave the benefits person my direct fax and asked them to send to me and I will complete.

## 2019-06-12 NOTE — Telephone Encounter (Signed)
Pt called stating that his pharmacy informed him that they have not received the prescription for his Armodafinil 150 MG tablet yet. He would like to know if RN can resend the refill in again to the [facility] on [address].

## 2019-06-16 ENCOUNTER — Telehealth: Payer: Self-pay

## 2019-06-16 ENCOUNTER — Other Ambulatory Visit: Payer: Self-pay

## 2019-06-16 ENCOUNTER — Ambulatory Visit (INDEPENDENT_AMBULATORY_CARE_PROVIDER_SITE_OTHER): Payer: 59 | Admitting: Podiatry

## 2019-06-16 DIAGNOSIS — G35 Multiple sclerosis: Secondary | ICD-10-CM

## 2019-06-16 DIAGNOSIS — L97512 Non-pressure chronic ulcer of other part of right foot with fat layer exposed: Secondary | ICD-10-CM

## 2019-06-16 NOTE — Telephone Encounter (Signed)
Pending approval for Armodafinil 250 mg tablet  Key: [id number] Rx #: [id number]    [facility] has received your information, and the request will be reviewed. You may close this dialog, return to your dashboard, and perform other tasks.  You will receive an electronic determination in CoverMyMeds. You can see the latest determination by locating this request on your dashboard or by reopening this request. You will also receive a faxed copy of the determination. If you have any questions please contact [facility] at [phone].  If you need assistance, please chat with CoverMyMeds or call us at [phone].

## 2019-06-18 ENCOUNTER — Other Ambulatory Visit: Payer: Self-pay | Admitting: Neurology

## 2019-06-18 DIAGNOSIS — G35 Multiple sclerosis: Secondary | ICD-10-CM

## 2019-06-18 DIAGNOSIS — R269 Unspecified abnormalities of gait and mobility: Secondary | ICD-10-CM

## 2019-06-18 NOTE — Telephone Encounter (Signed)
I called [facility] that pt sent mychart message that he has not be able to get tecfidera medication. They stated pt has to call for delivery of medications. They have call pt 06/16/2019 and 06/17/19. PT has not return their phone call. They have that [employer] was approve.I also stated pts message stated we need to send in a form to get a co pay card. The rep stated pt has to call [employer] main number to get assisted with the medication. They dont need anything from our office at this time. If pt dont call to get copay card it will be 50 dollars per month, but with co pay card its free. She stated pt has to call  [phone] for delivery of his meds. Pt has to call [phone] to get co pay card.

## 2019-06-18 NOTE — Progress Notes (Signed)
   HPI: 59 y.o. male presenting today for follow up evaluation of an ulceration of the right dorsal foot. He states the wound is improving and he is doing well. He has been applying Gentamicin cream. He denies any significant pain or worsening factors. Patient is here for further evaluation and treatment.   Past Medical History:  Diagnosis Date  . Abnormality of gait 04/22/2013  . Asthma   . BPH (benign prostatic hyperplasia)   . Hypertension   . Multiple sclerosis ([facility])   . 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     Physical Exam: General: The patient is alert and oriented x3 in no acute distress.  Dermatology: Wound #1 noted to the right dorsal foot measuring 0.5 x 0.5 x 0.1 cm.   To the above-noted ulceration, there is no eschar. There is a moderate amount of slough, fibrin and necrotic tissue. Granulation tissue and wound base is red. There is no malodor. There is a minimal amount of serosanginous drainage noted. Periwound integrity is intact.  Skin is warm, dry and supple bilateral lower extremities.  Vascular: Mildly increased erythema noted to right dorsal foot slightly concerning for cellulitis. Palpable pedal pulses bilaterally. Capillary refill within normal limits.  Neurological: Epicritic and protective threshold grossly intact bilaterally.   Musculoskeletal Exam: Range of motion within normal limits to all pedal and ankle joints bilateral. Muscle strength 5/5 in all groups bilateral.    Assessment: 1. Ulceration of the right dorsal foot - improved    Plan of Care:  1. Patient evaluated.   2. Medically necessary excisional debridement including subcutaneous tissue was performed using a tissue nipper and a chisel blade. Excisional debridement of all the necrotic nonviable tissue down to healthy bleeding viable tissue was performed with post-debridement measurements same as pre-. 3. The wound was cleansed and dry sterile dressing applied. 4. Continue using  Gentamicin cream.  5. Return to clinic in 4 weeks.         Felecia Shelling, DPM [facility]  Dr. Felecia Shelling, DPM    [address] [place], [place] [address]                Office [phone]  Fax [phone]

## 2019-06-18 NOTE — Telephone Encounter (Signed)
I tried to call patients phone but it went to vm. LEft vm for patient to call us back.

## 2019-06-19 NOTE — Telephone Encounter (Signed)
PT sent mychart message that his tecfidera is on the way to be deliver to him.

## 2019-06-26 ENCOUNTER — Other Ambulatory Visit: Payer: Self-pay

## 2019-06-26 ENCOUNTER — Telehealth: Payer: Self-pay | Admitting: Neurology

## 2019-06-26 MED ORDER — ARMODAFINIL 150 MG PO TABS: 150.0000 mg | ORAL_TABLET | Freq: Every day | ORAL | 1 refills | Status: DC

## 2019-06-26 MED ORDER — DIMETHYL FUMARATE 240 MG PO CPDR: 1.0000 | DELAYED_RELEASE_CAPSULE | Freq: Two times a day (BID) | ORAL | 11 refills | Status: DC

## 2019-06-26 NOTE — Telephone Encounter (Signed)
Pharmacy did not receive when sent on 2/11, was printed, will e-prescribe again

## 2019-06-26 NOTE — Addendum Note (Signed)
Addended by: Manuela Schwartz on: 06/26/2019 04:50 PM   Modules accepted: Orders

## 2019-06-26 NOTE — Telephone Encounter (Signed)
Pt requesting refills for Dimethyl Fumarate (TECFIDERA) 240 MG CPDR sent to [facility]

## 2019-07-14 ENCOUNTER — Other Ambulatory Visit: Payer: Self-pay

## 2019-07-14 ENCOUNTER — Other Ambulatory Visit: Payer: Self-pay | Admitting: Podiatry

## 2019-07-14 ENCOUNTER — Ambulatory Visit (INDEPENDENT_AMBULATORY_CARE_PROVIDER_SITE_OTHER): Payer: 59 | Admitting: Podiatry

## 2019-07-14 DIAGNOSIS — L97512 Non-pressure chronic ulcer of other part of right foot with fat layer exposed: Secondary | ICD-10-CM | POA: Diagnosis not present

## 2019-07-14 DIAGNOSIS — G35 Multiple sclerosis: Secondary | ICD-10-CM

## 2019-07-14 MED ORDER — DOXYCYCLINE HYCLATE 100 MG PO TABS: 100.0000 mg | ORAL_TABLET | Freq: Two times a day (BID) | ORAL | 0 refills | Status: DC

## 2019-07-14 MED ORDER — GENTAMICIN SULFATE 0.1 % EX CREA: 1.0000 "application " | TOPICAL_CREAM | Freq: Two times a day (BID) | CUTANEOUS | 1 refills | Status: DC

## 2019-07-16 NOTE — Progress Notes (Signed)
   HPI: 59 y.o. male presenting today for follow up evaluation of an ulceration of the right dorsal foot. He reports some redness around the wound. He denies any pain. He has been using Gentamicin cream as directed. There are no modifying factors noted. Patient is here for further evaluation and treatment.   Past Medical History:  Diagnosis Date  . Abnormality of gait 04/22/2013  . Asthma   . BPH (benign prostatic hyperplasia)   . Hypertension   . Multiple sclerosis ([facility])   . 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     Physical Exam: General: The patient is alert and oriented x3 in no acute distress.  Dermatology: Wound #1 noted to the right dorsal foot measuring 0.5 x 0.5 x 0.1 cm.   To the above-noted ulceration, there is no eschar. There is a moderate amount of slough, fibrin and necrotic tissue. Granulation tissue and wound base is red. There is no malodor. There is a minimal amount of serosanginous drainage noted. Periwound integrity is intact. Mild erythema noted around the ulceration site.  Skin is warm, dry and supple bilateral lower extremities.  Vascular: Palpable pedal pulses bilaterally. Capillary refill within normal limits.  Neurological: Epicritic and protective threshold grossly intact bilaterally.   Musculoskeletal Exam: Range of motion within normal limits to all pedal and ankle joints bilateral. Muscle strength 5/5 in all groups bilateral.    Assessment: 1. Ulceration of the right dorsal foot  2. Mild, localized cellulitis noted to the periwound    Plan of Care:  1. Patient evaluated.   2. Medically necessary excisional debridement including subcutaneous tissue was performed using a tissue nipper and a chisel blade. Excisional debridement of all the necrotic nonviable tissue down to healthy bleeding viable tissue was performed with post-debridement measurements same as pre-. 3. The wound was cleansed and dry sterile dressing applied. 4. Refill  prescription for Gentamicin cream provided to patient.  5. Culture taken from wound.  6. Prescription for Doxycycline provided to patient.  7. Return to clinic in 4 weeks.         Felecia Shelling, DPM [facility]  Dr. Felecia Shelling, DPM    [address] [place], [place] [address]                Office [phone]  Fax [phone]

## 2019-07-17 LAB — WOUND CULTURE
MICRO NUMBER:: 10252250
RESULT:: NO GROWTH
SPECIMEN QUALITY:: ADEQUATE

## 2019-07-17 LAB — HOUSE ACCOUNT TRACKING

## 2019-07-17 MED ORDER — TECFIDERA 120 & 240 MG PO MISC: ORAL | 0 refills | Status: DC

## 2019-07-17 NOTE — Telephone Encounter (Signed)
I called pt. He has been off treatment for 3 months. TECFIDERA.  I called [employer] spoke to [facility].   Approved for copay program for [facility].  Order placed for starter pack and maintenance. To [employer].  I spoke to pt and let him know.  Will have Dr. Jorge Mandril as Maralyn Sago NP gone.

## 2019-07-17 NOTE — Addendum Note (Signed)
Addended by: [name] on: 07/17/2019 05:14 PM   Modules accepted: Orders

## 2019-07-17 NOTE — Telephone Encounter (Signed)
I called and spoke Alexis at [employer].  She stated pt is approved for copay program.  Prescription to be sent to [facility] pharmacy for starter pack and maintenance as pt has not been on medication for 3 months.  Order placed sent to Dr. Anne Hahn to [place].

## 2019-07-20 ENCOUNTER — Other Ambulatory Visit: Payer: Self-pay | Admitting: Family Medicine

## 2019-07-20 DIAGNOSIS — I10 Essential (primary) hypertension: Secondary | ICD-10-CM

## 2019-07-21 NOTE — Telephone Encounter (Signed)
hydrochlorothiazide 12.5 mg 1 qd refilled per medication refill protocol.

## 2019-07-24 ENCOUNTER — Telehealth: Payer: Self-pay | Admitting: Neurology

## 2019-07-24 NOTE — Telephone Encounter (Signed)
See other note

## 2019-07-24 NOTE — Telephone Encounter (Signed)
I intiated another PA for armodafinil 150mg  daily to [facility] KEY [id number].  Pt has MS fatigue, also OSA.

## 2019-07-24 NOTE — Telephone Encounter (Signed)
I called the pharmacy about the armodafinil, I sent a refill on 06/26/2019, they received it, reports it requires a PA, says they sent it, they will refax it over today. Looks like [place] did [place] on 06/16/2019

## 2019-07-24 NOTE — Telephone Encounter (Signed)
I spoke to pt and relayed that another PA is being done for him in the armodifinil. He was understanding about the delay.  I will check on this for him on Monday.

## 2019-07-28 NOTE — Telephone Encounter (Signed)
Approval received from [facility] for Armodafinil 150mg  po tablet approved 07-25-19 thru 07-24-20.  Fax confirmation from 12-01-1999. Mychart message to pt re: approval.,

## 2019-08-05 NOTE — Progress Notes (Addendum)
PATIENT: Randy Shaw DOB: Jun 29, 1960  REASON FOR VISIT: follow up HISTORY FROM: patient  HISTORY OF PRESENT ILLNESS: Today 08/06/19  Randy Shaw 59 year old male with history of multiple sclerosis on Tecfidera.  He uses a motorized wheelchair.  He has a documented left INO, has double vision when looking to the right, has seen ophthalmology recently.  He remains on baclofen and Nuvigil.  His Tecfidera was switched to generic, then went back to brand name.  He has had a few falls, lost his balance while standing, trying to do activities.  Denies any new numbness or weakness, has chronic weakness on the left side, difficulty lifting the left leg.  No changes in bowels or bladder, has urinary urgency, on Myrbetriq.  He takes the bus, took scat bus today.  He lives alone, does his household activities.  He can stand and walk short distances if he can hold onto to something. Recent fall, abrasion to left upper arm.  Seen podiatry for right foot ulcer. He presents today unaccompanied.  HISTORY  02/05/2019 SS: Randy Shaw is a 59 year old male with history of multiple sclerosis.  He remains on Tecfidera.  He gets around with a motorized wheelchair.  He last had MRI of the brain and cervical spine in March 2019, did not show any change from prior. He has a documented left INO, and has double vision when looking to the right.  He remains on baclofen and Nuvigil.  Nuvigil has been beneficial for his fatigue.  He continues to live alone.  He is able to stand and walk short distances if he can hold on to something.  He had a recent fall, where his legs became weak and gave way, he was not injured.  He denies any changes to his bowels or bladder.  He denies any new numbness or weakness to his arms or legs.  He was brought to this appointment by [employer] transportation.  He denies any new problems or concerns.  He presents today for follow-up unaccompanied.  REVIEW OF SYSTEMS: Out of a complete 14 system review of  symptoms, the patient complains only of the following symptoms, and all other reviewed systems are negative.  Weakness, falls, fatigue  ALLERGIES: Allergies  Allergen Reactions  . Peanut-Containing Drug Products Shortness Of Breath  . Zanaflex [Tizanidine Hcl] Shortness Of Breath    HOME MEDICATIONS: Outpatient Medications Prior to Visit  Medication Sig Dispense Refill  . Armodafinil 150 MG tablet Take 1 tablet (150 mg total) by mouth daily. 90 tablet 1  . doxycycline (VIBRA-TABS) 100 MG tablet Take 1 tablet (100 mg total) by mouth 2 (two) times daily. 20 tablet 0  . gentamicin cream (GARAMYCIN) 0.1 % Apply 1 application topically 2 (two) times daily. 15 g 1  . hydrochlorothiazide (MICROZIDE) 12.5 MG capsule TAKE 1 CAPSULE(12.5 MG) BY MOUTH DAILY 90 capsule 0  . tamsulosin (FLOMAX) 0.4 MG CAPS capsule Take 1 capsule (0.4 mg total) by mouth daily. 30 capsule 0  . TECFIDERA 120 & 240 MG MISC Starter pack 120mg  po bid x 7 days 240mg  po bid x 23 days  #60 Then 240mg  po bid  #180. With 3 refills. 60 each 0  . baclofen (LIORESAL) 20 MG tablet TAKE 1 TABLET BY MOUTH IN THE MORNING, 1 TABLET AT NOON, AND 2 TABLETS AT NIGHT 120 tablet 11  . MYRBETRIQ 50 MG TB24 tablet     . SANTYL ointment APP EXT AA D.    . TECFIDERA 240 MG  CPDR Take 1 capsule by mouth 2 (two) times daily.     Facility-Administered Medications Prior to Visit  Medication Dose Route Frequency Provider Last Rate Last Admin  . gadopentetate dimeglumine (MAGNEVIST) injection 20 mL  20 mL Intravenous Once PRN York Spaniel, MD        PAST MEDICAL HISTORY: Past Medical History:  Diagnosis Date  . Abnormality of gait 04/22/2013  . Asthma   . BPH (benign prostatic hyperplasia)   . Hypertension   . Multiple sclerosis (HCC)   . 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    PAST SURGICAL HISTORY: Past Surgical History:  Procedure Laterality Date  . none      FAMILY HISTORY: Family History  Problem Relation Age of  Onset  . Cancer Mother        breast  . Stroke Father   . Cancer - Colon Father   . Multiple sclerosis Neg Hx     SOCIAL HISTORY: Social History   Socioeconomic History  . Marital status: Divorced    Spouse name: n/a  . Number of children: 3  . Years of education: 53  . Highest education level: Not on file  Occupational History  . Occupation: disabled/retired 1999-[place]    Comment: Event organiser  Tobacco Use  . Smoking status: Never Smoker  . Smokeless tobacco: Never Used  Substance and Sexual Activity  . Alcohol use: Yes    Alcohol/week: 0.0 standard drinks    Comment: occasionally wine cooler  . Drug use: No  . Sexual activity: Never  Other Topics Concern  . Not on file  Social History Narrative   Lives alone, house.  3 children.  Caffeine none.  Green Tea once week.     Social Determinants of Health   Financial Resource Strain:   . Difficulty of Paying Living Expenses:   Food Insecurity:   . Worried About Programme researcher, broadcasting/film/video in the Last Year:   . Barista in the Last Year:   Transportation Needs:   . Freight forwarder (Medical):   Marland Kitchen Lack of Transportation (Non-Medical):   Physical Activity:   . Days of Exercise per Week:   . Minutes of Exercise per Session:   Stress:   . Feeling of Stress :   Social Connections:   . Frequency of Communication with Friends and Family:   . Frequency of Social Gatherings with Friends and Family:   . Attends Religious Services:   . Active Member of Clubs or Organizations:   . Attends Banker Meetings:   Marland Kitchen Marital Status:   Intimate Partner Violence:   . Fear of Current or Ex-Partner:   . Emotionally Abused:   Marland Kitchen Physically Abused:   . Sexually Abused:    PHYSICAL EXAM  Vitals:   08/06/19 1049  BP: (!) 142/88  Pulse: 88  Temp: (!) 97 F (36.1 C)  Weight: 210 lb (95.3 kg)  Height: 6\' 3"  (1.905 m)   Body mass index is 26.25 kg/m.  Generalized: Well developed, in no acute distress   Neurological examination  Mentation: Alert oriented to time, place, history taking. Follows all commands speech and language fluent, very pleasant Cranial nerve II-XII: Pupils were equal round reactive to light. Extraocular movements were full, but has left INO. Facial sensation and strength were normal.  Head turning and shoulder shrug were normal and symmetric. Motor: Good strength, exception of 3/5 left leg, increased motor tone in the left greater than the right  leg Sensory: Decreased sensation to soft touch noted to left side when compared to right Coordination: Mild dysmetria with left finger-nose-finger, cannot perform heel to shin with left leg Gait and station: He was not ambulated, is in a motor wheelchair Reflexes: Deep tendon reflexes are brisk throughout  DIAGNOSTIC DATA (LABS, IMAGING, TESTING) - I reviewed patient records, labs, notes, testing and imaging myself where available.  Lab Results  Component Value Date   WBC 5.6 02/05/2019   HGB 14.3 02/05/2019   HCT 43.8 02/05/2019   MCV 83 02/05/2019   PLT 227 02/05/2019      Component Value Date/Time   NA 140 01/01/2019 1107   K 4.1 01/01/2019 1107   CL 101 01/01/2019 1107   CO2 24 01/01/2019 1107   GLUCOSE 98 01/01/2019 1107   GLUCOSE 96 07/30/2013 0952   BUN 14 01/01/2019 1107   CREATININE 0.90 01/01/2019 1107   CREATININE 0.89 07/30/2013 0952   CALCIUM 10.0 01/01/2019 1107   PROT 7.1 01/01/2019 1107   ALBUMIN 4.4 01/01/2019 1107   AST 12 01/01/2019 1107   ALT 15 01/01/2019 1107   ALKPHOS 79 01/01/2019 1107   BILITOT 0.3 01/01/2019 1107   GFRNONAA 93 01/01/2019 1107   GFRNONAA >89 07/30/2013 0952   GFRAA 108 01/01/2019 1107   GFRAA >89 07/30/2013 0952   Lab Results  Component Value Date   CHOL 233 (H) 01/01/2019   HDL 67 01/01/2019   LDLCALC 149 (H) 01/01/2019   TRIG 98 01/01/2019   CHOLHDL 3.5 01/01/2019   Lab Results  Component Value Date   HGBA1C 5.6 02/21/2018   Lab Results  Component Value  Date   VITAMINB12 343 07/06/2017   Lab Results  Component Value Date   TSH 3.570 07/06/2017      ASSESSMENT AND PLAN 59 y.o. year old male  has a past medical history of Abnormality of gait (04/22/2013), Asthma, BPH (benign prostatic hyperplasia), Hypertension, Multiple sclerosis (HCC), and Quadriplegia and quadriparesis (HCC) (03/08/2016). here with:  1.  Multiple sclerosis 2.  Quadriparesis, left-sided weakness 3.  Gait disorder 4.  Left INO, double vision  Since last visit, he has remained stable, but has reported a few falls due to loss of balance.  I will get him set up for physical therapy at neuro rehab.  He will remain on Tecfidera.  I will order MRI of the brain and cervical spine with and without contrast.  I will check blood work.  He will remain on baclofen and Nuvigil.  He will follow-up in 6 months or sooner if needed.  Addendum 10/06/2019 SS: Randy Shaw is working with PT, he needs a new AFO brace to improve safety for ambulation. He has gait and balance issues as result of MS.   I spent 30 minutes of face-to-face and non-face-to-face time with patient.  This included previsit chart review, lab review, study review, order entry, electronic health record documentation, patient education.  Margie Ege, AGNP-C, DNP 08/06/2019, 11:34 AM [facility] [address], [address] [place], [place] [address] [phone]

## 2019-08-06 ENCOUNTER — Other Ambulatory Visit: Payer: Self-pay

## 2019-08-06 ENCOUNTER — Encounter: Payer: Self-pay | Admitting: Neurology

## 2019-08-06 ENCOUNTER — Ambulatory Visit (INDEPENDENT_AMBULATORY_CARE_PROVIDER_SITE_OTHER): Payer: 59 | Admitting: Neurology

## 2019-08-06 VITALS — BP 142/88 | HR 88 | Temp 97.0°F | Ht 75.0 in | Wt 210.0 lb

## 2019-08-06 DIAGNOSIS — G35 Multiple sclerosis: Secondary | ICD-10-CM

## 2019-08-06 DIAGNOSIS — G8250 Quadriplegia, unspecified: Secondary | ICD-10-CM | POA: Diagnosis not present

## 2019-08-06 MED ORDER — BACLOFEN 20 MG PO TABS: ORAL_TABLET | ORAL | 11 refills | Status: DC

## 2019-08-06 NOTE — Patient Instructions (Addendum)
It was great to see you today Please be careful not to fall, I am going to order PT  Check MRI brain and cervical spine  Check blood work today Continue current medications See you in 6 months

## 2019-08-06 NOTE — Progress Notes (Signed)
I have read the note, and I agree with the clinical assessment and plan.  Thierry K Demonte Dobratz   

## 2019-08-07 ENCOUNTER — Telehealth: Payer: Self-pay

## 2019-08-07 LAB — COMPREHENSIVE METABOLIC PANEL
ALT: 26 IU/L (ref 0–44)
AST: 18 IU/L (ref 0–40)
Albumin/Globulin Ratio: 1.7 (ref 1.2–2.2)
Albumin: 4.5 g/dL (ref 3.8–4.9)
Alkaline Phosphatase: 81 IU/L (ref 39–117)
BUN/Creatinine Ratio: 18 (ref 9–20)
BUN: 17 mg/dL (ref 6–24)
Bilirubin Total: 0.4 mg/dL (ref 0.0–1.2)
CO2: 26 mmol/L (ref 20–29)
Calcium: 9.7 mg/dL (ref 8.7–10.2)
Chloride: 105 mmol/L (ref 96–106)
Creatinine, Ser: 0.94 mg/dL (ref 0.76–1.27)
GFR calc Af Amer: 102 mL/min/{1.73_m2} (ref >59)
GFR calc non Af Amer: 88 mL/min/{1.73_m2} (ref >59)
Globulin, Total: 2.7 g/dL (ref 1.5–4.5)
Glucose: 105 mg/dL — ABNORMAL HIGH (ref 65–99)
Potassium: 3.7 mmol/L (ref 3.5–5.2)
Sodium: 145 mmol/L — ABNORMAL HIGH (ref 134–144)
Total Protein: 7.2 g/dL (ref 6.0–8.5)

## 2019-08-07 LAB — CBC WITH DIFFERENTIAL/PLATELET
Basophils Absolute: 0.1 10*3/uL (ref 0.0–0.2)
Basos: 1 %
EOS (ABSOLUTE): 0.3 10*3/uL (ref 0.0–0.4)
Eos: 5 %
Hematocrit: 41.6 % (ref 37.5–51.0)
Hemoglobin: 14.2 g/dL (ref 13.0–17.7)
Immature Grans (Abs): 0 10*3/uL (ref 0.0–0.1)
Immature Granulocytes: 0 %
Lymphocytes Absolute: 2 10*3/uL (ref 0.7–3.1)
Lymphs: 30 %
MCH: 28.5 pg (ref 26.6–33.0)
MCHC: 34.1 g/dL (ref 31.5–35.7)
MCV: 84 fL (ref 79–97)
Monocytes Absolute: 0.6 10*3/uL (ref 0.1–0.9)
Monocytes: 9 %
Neutrophils Absolute: 3.6 10*3/uL (ref 1.4–7.0)
Neutrophils: 55 %
Platelets: 229 10*3/uL (ref 150–450)
RBC: 4.98 x10E6/uL (ref 4.14–5.80)
RDW: 12.5 % (ref 11.6–15.4)
WBC: 6.6 10*3/uL (ref 3.4–10.8)

## 2019-08-07 NOTE — Telephone Encounter (Signed)
-----   Message from Glean Salvo, NP sent at 08/07/2019  6:07 AM EDT ----- Labs look overall good, on Tecfidera. Sodium level mildly high, increase water intake.

## 2019-08-07 NOTE — Telephone Encounter (Signed)
I called pt and discussed lab results and recommendations with him. Pt verbalized understanding of results. Pt had no questions at this time but was encouraged to call back if questions arise.

## 2019-08-11 ENCOUNTER — Ambulatory Visit: Payer: 59 | Admitting: Podiatry

## 2019-08-21 ENCOUNTER — Telehealth: Payer: Self-pay | Admitting: Neurology

## 2019-08-21 NOTE — Telephone Encounter (Signed)
Cervical spine auth: [id number] (exp. 08/21/19 to 11/19/19)

## 2019-08-21 NOTE — Telephone Encounter (Signed)
[employer] auth for the Brain [id number] (exp. 08/11/19 to 11/09/19)  Cervical is pending still I uploaded the notes on the portal.

## 2019-08-25 ENCOUNTER — Ambulatory Visit (INDEPENDENT_AMBULATORY_CARE_PROVIDER_SITE_OTHER): Payer: 59 | Admitting: Podiatry

## 2019-08-25 ENCOUNTER — Other Ambulatory Visit: Payer: Self-pay

## 2019-08-25 DIAGNOSIS — L97512 Non-pressure chronic ulcer of other part of right foot with fat layer exposed: Secondary | ICD-10-CM

## 2019-08-25 NOTE — Telephone Encounter (Signed)
Left a voicemail for pt to call me back about scheduling his MRI's.

## 2019-08-25 NOTE — Telephone Encounter (Signed)
Patient is scheduled at [facility] for 09/02/19.

## 2019-08-25 NOTE — Telephone Encounter (Signed)
Pt has called Irving Burton back re: his MRI.  Please call

## 2019-08-28 NOTE — Progress Notes (Signed)
   HPI: 59 y.o. male presenting today for follow up evaluation of an ulceration of the right dorsal foot. He states he is doing well and has improved significantly. He denies any pain or worsening factors. He has been using Gentamicin cream as directed which has helped resolve his symptoms. Patient is here for further evaluation and treatment.   Past Medical History:  Diagnosis Date  . Abnormality of gait 04/22/2013  . Asthma   . BPH (benign prostatic hyperplasia)   . Hypertension   . Multiple sclerosis (HCC)   . 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     Physical Exam: General: The patient is alert and oriented x3 in no acute distress.  Dermatology: Wound noted to the right dorsal foot has healed. Complete re-epithelialization has occurred. No drainage noted.   Vascular: Palpable pedal pulses bilaterally. Capillary refill within normal limits.  Neurological: Epicritic and protective threshold grossly intact bilaterally.   Musculoskeletal Exam: Range of motion within normal limits to all pedal and ankle joints bilateral. Muscle strength 5/5 in all groups bilateral.    Assessment: 1. Ulceration of the right dorsal foot - healed  2. Mild, localized cellulitis noted to the periwound - resolved    Plan of Care:  1. Patient evaluated.   2. Continue using Gentamicin cream as needed.  3. Recommended good shoe gear.  4. Return to clinic as needed.       Felecia Shelling, DPM [facility]  Dr. Felecia Shelling, DPM    [address] [place], [place] [address]                Office [phone]  Fax [phone]

## 2019-09-02 ENCOUNTER — Other Ambulatory Visit: Payer: Self-pay

## 2019-09-02 ENCOUNTER — Ambulatory Visit: Payer: 59

## 2019-09-02 DIAGNOSIS — G35 Multiple sclerosis: Secondary | ICD-10-CM

## 2019-09-02 MED ORDER — GADOBENATE DIMEGLUMINE 529 MG/ML IV SOLN
20.0000 mL | Freq: Once | INTRAVENOUS | Status: AC | PRN
  Administered 2019-09-02: 20 mL via INTRAVENOUS

## 2019-09-04 ENCOUNTER — Telehealth: Payer: Self-pay | Admitting: *Deleted

## 2019-09-04 NOTE — Telephone Encounter (Signed)
LMVM for pt that his brain and cervical MR both were stable.  He is to call back if questions.

## 2019-09-04 NOTE — Telephone Encounter (Signed)
-----   Message from Glean Salvo, NP sent at 09/04/2019 12:17 PM EDT ----- Please let the patient know. MRI of the brain and cervical spine were stable.   MRI brain (with and without) demonstrating: - Multiple supratentorial and infratentorial chronic demyelinating plaques.  - No acute plaques. - Stable from 07/17/17.   MRI cervical spine (with and without) demonstrating: - Multiple chronic demyelinating plaques from C2 down to C7.  - No acute plaques. No change from 07/17/17.

## 2019-09-09 ENCOUNTER — Ambulatory Visit: Payer: 59

## 2019-09-12 ENCOUNTER — Ambulatory Visit: Payer: 59 | Attending: Neurology

## 2019-09-12 ENCOUNTER — Other Ambulatory Visit: Payer: Self-pay

## 2019-09-12 DIAGNOSIS — G35 Multiple sclerosis: Secondary | ICD-10-CM | POA: Insufficient documentation

## 2019-09-12 DIAGNOSIS — R2689 Other abnormalities of gait and mobility: Secondary | ICD-10-CM

## 2019-09-12 DIAGNOSIS — M6281 Muscle weakness (generalized): Secondary | ICD-10-CM | POA: Diagnosis present

## 2019-09-12 DIAGNOSIS — R2681 Unsteadiness on feet: Secondary | ICD-10-CM | POA: Insufficient documentation

## 2019-09-12 NOTE — Therapy (Addendum)
[facility] [address] [place], [place], [address] Phone: [phone]   Fax:  [phone]  Physical Therapy Evaluation  Patient Details  Name: Randy Shaw MRN: [id number] Date of Birth: [birth year] Referring Provider (PT): Margie Ege, NP   Encounter Date: 09/12/2019  PT End of Session - 09/12/19 1654    Visit Number  1    Number of Visits  9    Date for PT Re-Evaluation  11/07/19    PT Start Time  1315    PT Stop Time  1400    PT Time Calculation (min)  45 min    Equipment Utilized During Treatment  Gait belt    Activity Tolerance  Patient tolerated treatment well    Behavior During Therapy  [facility] for tasks assessed/performed       Past Medical History:  Diagnosis Date  . Abnormality of gait 04/22/2013  . Asthma   . BPH (benign prostatic hyperplasia)   . Hypertension   . Multiple sclerosis (HCC)   . 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 none      There were no vitals filed for this visit.   Subjective Assessment - 09/12/19 1309    Subjective  Patient is here for physical therapy evaluation. Hx of Multiple Sclerosis. He has left INO and has double vision when he looks to the right. He uses motorized wheelchair but is able to walk for short distances if he is holding on to something. He reports left side is more weak compared to his Right side. Pt reports that he has had about 4 falls in past 6 months. Most of these falls were becaues he was trying to reach for something too far and he lost balance and fell.    Pertinent History  BPH, hx of falls, MS, HTN    How long can you sit comfortably?  no limits    How long can you stand comfortably?  2 min    How long can you walk comfortably?  5 steps with holding on to furniture    Patient Stated Goals  Work on strengthening    Currently in Pain?  No/denies         [facility] PT Assessment -  09/12/19 1317      Assessment   Medical Diagnosis  Multiple Sclerosis    Referring Provider (PT)  Margie Ege, NP    Onset Date/Surgical Date  08/06/19    Hand Dominance  Right      Precautions   Precautions  Fall      Restrictions   Weight Bearing Restrictions  No      Balance Screen   Has the patient fallen in the past 6 months  Yes    How many times?  4    Has the patient had a decrease in activity level because of a fear of falling?   No    Is the patient reluctant to leave their home because of a fear of falling?   No      Home Environment   Living Environment  Private residence    Living Arrangements  Alone    Available Help at Discharge  Family;Friend(s)    Type of Home  House    Home Access  Ramped entrance   has handicap ramp   Home Layout  One level    Home Equipment  Walker - 4 wheels;Shower seat;Wheelchair -  manual;Wheelchair - power;Grab bars - toilet;Grab bars - tub/shower      Prior Function   Level of Independence  Requires assistive device for independence;Needs assistance with gait    Vocation  On disability      Cognition   Overall Cognitive Status  Within Functional Limits for tasks assessed    Attention  Focused    Focused Attention  Appears intact    Awareness  Appears intact    Problem Solving  Appears intact      Tone   Assessment Location  Left Lower Extremity      ROM / Strength   AROM / PROM / Strength  Strength      Strength   Strength Assessment Site  Hip;Knee;Ankle    Right/Left Hip  Right;Left    Right Hip Flexion  4/5    Right Hip ABduction  4/5    Right Hip ADduction  4/5    Left Hip Flexion  1/5    Left Hip ABduction  1/5    Left Hip ADduction  1/5    Right/Left Knee  Left;Right    Right Knee Flexion  4/5    Right Knee Extension  4/5    Left Knee Flexion  0/5    Left Knee Extension  3+/5    Right/Left Ankle  Right;Left    Right Ankle Dorsiflexion  5/5    Left Ankle Dorsiflexion  0/5      Bed Mobility   Bed Mobility   Rolling Right;Rolling Left;Supine to Sit;Sit to Supine    Rolling Right  Independent    Rolling Left  Independent    Supine to Sit  Independent    Sit to Supine  Independent      Transfers   Transfers  Stand Pivot Transfers   uses bil hands on bed during transfers     Ambulation/Gait   Ambulation/Gait  Yes    Ambulation/Gait Assistance  2: Max assist   use of bil parallel bars   Ambulation/Gait Assistance Details  10 feet    Assistive device  Parallel bars    Gait Pattern  Decreased stride length;Decreased step length - left;Decreased step length - right;Decreased dorsiflexion - left;Decreased weight shift to left;Left hip hike;Poor foot clearance - left      Wheelchair Mobility   Wheelchair Mobility  Yes    [employer] Management  Independent      LLE Tone   LLE Tone  Modified Ashworth      LLE Tone   Modified Ashworth Scale for Grading Hypertonia LLE  Considerable increase in muschle tone, passive movement difficult                  Objective measurements completed on examination: See above findings.              PT Education - 09/13/19 2032    Education Details  Patient educated on gradually increasing use of rolling walker instead of using power chair at home. Pt educated to try using RW for 1 hour at least 2x/week for first couple of weeks, as long as he feels safe to do it at home.    Methods  Explanation    Comprehension  Verbalized understanding       PT Short Term Goals - 09/13/19 2033      PT SHORT TERM GOAL #1   Title  Patient will be able to ambulate 10 feet with rolling walker  to improve ability to go from bed to bathroom    Baseline  Unable to do it in session due to Increased tone in L LE due to mising his baclofen this afternoon    Time  4    Period  Weeks    Status  New    Target Date  10/11/19      PT SHORT TERM GOAL #2   Title  Pt will be able to spend at least 2 hours a day and 3 days a  week with using his rollator within his house to improve household ambulation    Baseline  Pt is mainly using his power chair    Time  4    Period  Weeks    Target Date  10/11/19        PT Long Term Goals - 09/13/19 2037      PT LONG TERM GOAL #1   Title  Patient will be I and compliant with HEP to self manage his flexibility and improve strength    Baseline  not doing any exericses    Time  8    Period  Weeks    Status  New    Target Date  11/08/19      PT LONG TERM GOAL #2   Title  Pt will be able to use his rollator for at least 3 hours a day for 3-4 days a week to improve mobility with lesser AD.    Baseline  Only using power chair    Time  8    Period  Weeks    Status  New    Target Date  11/08/19             Plan - 09/13/19 2038    Clinical Impression Statement  Patient is a 59 y.o. male who was seen today for physical therapy evaluation and treatment for gait and mobility disorder, generalized weakness due to multiple sclerosis. Objective findings include increased muscle tone in L LE, significant decrease in strength of L LE, decreased transfers, decreased ability to stand and walk at home and increased falls. Patient will benefit from skilled PT to address these impairments and improve overall function.    Personal Factors and Comorbidities  Comorbidity 2;Fitness;Time since onset of injury/illness/exacerbation;Transportation    Comorbidities  hx of falls, HTN, MS    Examination-Activity Limitations  Bed Mobility;Carry;Locomotion Level;Dressing;Stairs;Stand;Transfers;Squat    Examination-Participation Restrictions  Cleaning;Community Activity;Meal Prep;Laundry;Shop    Stability/Clinical Decision Making  Unstable/Unpredictable    Clinical Decision Making  High    Rehab Potential  Fair    PT Frequency  1x / week   Pt only wants to come once a week   PT Duration  8 weeks    PT Treatment/Interventions  ADLs/Self Care Home Management;Gait training;Stair  training;Functional mobility training;Therapeutic activities;Therapeutic exercise;Balance training;Wheelchair mobility training;Passive range of motion    PT Next Visit Plan  Gait in parallel bars, trial of rollator, HEP for tone management and strengthening    PT Home Exercise Plan  knee to chest and lower trunk rotations    Consulted and Agree with Plan of Care  Patient       Patient will benefit from skilled therapeutic intervention in order to improve the following deficits and impairments:  Abnormal gait, Decreased activity tolerance, Decreased balance, Decreased endurance, Decreased safety awareness, Decreased range of motion, Decreased mobility, Decreased strength, Difficulty walking, Increased edema, Impaired flexibility, Impaired tone, Impaired vision/preception  Visit Diagnosis: Multiple sclerosis (HCC)  Other abnormalities of gait and mobility  Unsteadiness on feet  Muscle weakness (generalized)     Problem List Patient Active Problem List   Diagnosis Date Noted  . Quadriplegia and quadriparesis ([place]) 03/08/2016  . Abnormality of gait 04/22/2013  . HTN (hypertension) 06/29/2012  . Multiple sclerosis ([place]) 06/29/2012  . Benign prostatic hyperplasia with urinary obstruction 06/29/2012    Kerrie Pleasure 09/13/2019, 8:45 PM  [facility] [address] [place], [place], [address] Phone: [phone]   Fax:  [phone]  Name: ARIAN MURLEY MRN: [id number] Date of Birth: 04/05/1961

## 2019-09-13 NOTE — Addendum Note (Signed)
Addended by: [name] on: 09/13/2019 08:47 PM   Modules accepted: Orders

## 2019-09-23 ENCOUNTER — Other Ambulatory Visit: Payer: Self-pay

## 2019-09-23 ENCOUNTER — Ambulatory Visit: Payer: 59

## 2019-09-23 DIAGNOSIS — R2689 Other abnormalities of gait and mobility: Secondary | ICD-10-CM

## 2019-09-23 DIAGNOSIS — G35 Multiple sclerosis: Secondary | ICD-10-CM

## 2019-09-23 DIAGNOSIS — R2681 Unsteadiness on feet: Secondary | ICD-10-CM

## 2019-09-23 DIAGNOSIS — M6281 Muscle weakness (generalized): Secondary | ICD-10-CM

## 2019-09-23 NOTE — Therapy (Signed)
[facility] [address] [place], [place], [address] Phone: [phone]   Fax:  [phone]  Physical Therapy Treatment  Patient Details  Name: Randy Shaw MRN: [id number] Date of Birth: Sep 17, 1960 Referring Provider (PT): Margie Ege, NP   Encounter Date: 09/23/2019  PT End of Session - 09/23/19 1224    Visit Number  2    Number of Visits  9    Date for PT Re-Evaluation  11/07/19    PT Start Time  1130    PT Stop Time  1215    PT Time Calculation (min)  45 min    Equipment Utilized During Treatment  Gait belt    Activity Tolerance  Patient tolerated treatment well    Behavior During Therapy  [facility] for tasks assessed/performed       Past Medical History:  Diagnosis Date  . Abnormality of gait 04/22/2013  . Asthma   . BPH (benign prostatic hyperplasia)   . Hypertension   . Multiple sclerosis (HCC)   . 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 none      There were no vitals filed for this visit.  Subjective Assessment - 09/23/19 1138    Subjective  Pt reports exercises are difficult to do. He has tried to walk with walker at home (rollator) down the hall may be twice.    Pertinent History  BPH, hx of falls, MS, HTN    How long can you sit comfortably?  no limits    How long can you stand comfortably?  2 min    How long can you walk comfortably?  5 steps with holding on to furniture    Patient Stated Goals  Work on strengthening    Currently in Pain?  No/denies                Treatment:  Sit to stand: parallel bars: bil HHA: CGA Walking in parallel bars: bil HHA - 20 feet with 2 x 180 deg turns - 40 feet with 4 x 180 deg turns - 40 feet with 4 x 180 deg turns cues to keep BOS at feet wide and shuffle feet instead of keeping them together and pivoting on feet - 10 feet to transfer from power chair to manual chair with 2 180 deg  turns 10 feet from manual chair to power chair  Wheelchair pulls: 10 feet, required max A, pt able to provide some assistance with R LE but no hamstrings firing noted with L LE Isometric wheelchair pushes: L LE: 10 x 10" holds                 PT Short Term Goals - 09/23/19 1139      PT SHORT TERM GOAL #1   Title  Patient will be able to ambulate 10 feet with rolling walker to improve ability to go from bed to bathroom    Baseline  Unable to do it in session due to Increased tone in L LE due to mising his baclofen this afternoon    Time  4    Period  Weeks    Status  New    Target Date  10/11/19      PT SHORT TERM GOAL #2   Title  Pt will be able to spend at least 2 hours a day and 3 days a week with using his rollator within his house to improve household  ambulation    Baseline  Pt is mainly using his power chair    Time  4    Period  Weeks    Target Date  10/11/19        PT Long Term Goals - 09/23/19 1139      PT LONG TERM GOAL #1   Title  Patient will be I and compliant with HEP to self manage his flexibility and improve strength    Baseline  not doing any exericses    Time  8    Period  Weeks    Status  New      PT LONG TERM GOAL #2   Title  Pt will be able to use his rollator for at least 3 hours a day for 3-4 days a week to improve mobility with lesser AD.    Baseline  Only using power chair    Time  8    Period  Weeks    Status  New            Plan - 09/23/19 1139    Clinical Impression Statement  Pt able to ambulate in parallel bars 10 feet with bil HHA and CGA from PT. Patient has excessive lean on UE for support. Pt has difficulty with bending at hip and knee on L LE compared to R due to tone. Decreased L foot clearance compared to R foot.    Personal Factors and Comorbidities  Comorbidity 2;Fitness;Time since onset of injury/illness/exacerbation;Transportation    Comorbidities  hx of falls, HTN, MS    Examination-Activity Limitations  Bed  Mobility;Carry;Locomotion Level;Dressing;Stairs;Stand;Transfers;Squat    Examination-Participation Restrictions  Cleaning;Community Activity;Meal Prep;Laundry;Shop    Stability/Clinical Decision Making  Unstable/Unpredictable    Rehab Potential  Fair    PT Frequency  1x / week   Pt only wants to come once a week   PT Duration  8 weeks    PT Treatment/Interventions  ADLs/Self Care Home Management;Gait training;Stair training;Functional mobility training;Therapeutic activities;Therapeutic exercise;Balance training;Wheelchair mobility training;Passive range of motion    PT Next Visit Plan  Gait in parallel bars, try side steps, wheelchair pulls for hamstring activation, trial of rollator, HEP for tone management and strengthening    PT Home Exercise Plan  knee to chest and lower trunk rotations    Consulted and Agree with Plan of Care  Patient       Patient will benefit from skilled therapeutic intervention in order to improve the following deficits and impairments:  Abnormal gait, Decreased activity tolerance, Decreased balance, Decreased endurance, Decreased safety awareness, Decreased range of motion, Decreased mobility, Decreased strength, Difficulty walking, Increased edema, Impaired flexibility, Impaired tone, Impaired vision/preception  Visit Diagnosis: Multiple sclerosis ([place])  Other abnormalities of gait and mobility  Unsteadiness on feet  Muscle weakness (generalized)     Problem List Patient Active Problem List   Diagnosis Date Noted  . Quadriplegia and quadriparesis ([place]) 03/08/2016  . Abnormality of gait 04/22/2013  . HTN (hypertension) 06/29/2012  . Multiple sclerosis ([place]) 06/29/2012  . Benign prostatic hyperplasia with urinary obstruction 06/29/2012    Kerrie Pleasure, PT 09/23/2019, 12:25 PM  [facility] [address] [place], [place], [address] Phone: [phone]   Fax:  [phone]  Name: Randy Shaw MRN: [id number] Date of Birth: June 27, 1960

## 2019-10-02 ENCOUNTER — Other Ambulatory Visit: Payer: Self-pay

## 2019-10-02 ENCOUNTER — Ambulatory Visit: Payer: 59 | Attending: Neurology

## 2019-10-02 DIAGNOSIS — G35 Multiple sclerosis: Secondary | ICD-10-CM | POA: Insufficient documentation

## 2019-10-02 DIAGNOSIS — R2689 Other abnormalities of gait and mobility: Secondary | ICD-10-CM | POA: Insufficient documentation

## 2019-10-02 DIAGNOSIS — M6281 Muscle weakness (generalized): Secondary | ICD-10-CM | POA: Insufficient documentation

## 2019-10-02 DIAGNOSIS — R2681 Unsteadiness on feet: Secondary | ICD-10-CM | POA: Insufficient documentation

## 2019-10-02 NOTE — Therapy (Signed)
[facility] [address] [place], [place], [address] Phone: [phone]   Fax:  [phone]  Physical Therapy Treatment  Patient Details  Name: Randy Shaw MRN: [id number] Date of Birth: Mar 27, 1961 Referring Provider (PT): Butler Denmark, NP   Encounter Date: 10/02/2019  PT End of Session - 10/02/19 1303    Visit Number  3    Number of Visits  9    Date for PT Re-Evaluation  11/07/19    PT Start Time  1150    PT Stop Time  1230    PT Time Calculation (min)  40 min    Equipment Utilized During Treatment  Gait belt    Activity Tolerance  Patient tolerated treatment well    Behavior During Therapy  [facility] for tasks assessed/performed       Past Medical History:  Diagnosis Date  . Abnormality of gait 04/22/2013  . Asthma   . BPH (benign prostatic hyperplasia)   . Hypertension   . Multiple sclerosis ([place])   . Quadriplegia and quadriparesis ([place]) 03/08/2016    Past Surgical History:  Procedure Laterality Date  . none      There were no vitals filed for this visit.  Subjective Assessment - 10/02/19 1217    Subjective  Pt reports he brought in his AFO today. He got that AFO >10 yrs ago. He only puts it on when he walks with a rollator. Last time he used it was 1.5 months ago.    Pertinent History  BPH, hx of falls, MS, HTN    How long can you sit comfortably?  no limits    How long can you stand comfortably?  2 min    How long can you walk comfortably?  5 steps with holding on to furniture    Patient Stated Goals  Work on strengthening              Treatment: Don/Doff AFO: SBA patient took 25 min to don AFO and 5 min to doff AFO. Pt has 2 home made straps that he put on to secure AFO as old AFO straps cannot be secured due to wear.  Gait training: Fwd ambulation: 8 laps x 10 feet with 180 deg turns after 10 feet in parallel bars, bil HHA, cues to improve R-L step length Lateral ambulation: 2 laps each  way, cues to keep BOS wider when stepping to his left to improve stability and reduce strain on L knee.                    PT Short Term Goals - 10/02/19 1259      PT SHORT TERM GOAL #1   Title  Patient will be able to ambulate 10 feet with rolling walker to improve ability to go from bed to bathroom    Baseline  Unable to do it in session due to Increased tone in L LE due to mising his baclofen this afternoon    Time  4    Period  Weeks    Status  New    Target Date  10/11/19      PT SHORT TERM GOAL #2   Title  Pt will be able to spend at least 2 hours a day and 3 days a week with using his rollator within his house to improve household ambulation    Baseline  Pt is mainly using his power chair    Time  4  Period  Weeks    Target Date  10/11/19      PT SHORT TERM GOAL #3   Baseline  --        PT Long Term Goals - 10/02/19 1300      PT LONG TERM GOAL #1   Title  Patient will be I and compliant with HEP to self manage his flexibility and improve strength    Baseline  not doing any exericses    Time  8    Period  Weeks    Status  New      PT LONG TERM GOAL #2   Title  Pt will be able to use his rollator for at least 3 hours a day for 3-4 days a week to improve mobility with lesser AD.    Baseline  Only using power chair    Time  8    Period  Weeks    Status  New      PT LONG TERM GOAL #3   Title  Pt will be able to don his AFO under to conserve energy and to improve safe ambulation.    Baseline  currently taking 25 min to don his old AFO (10/02/19)    Time  8    Period  Weeks    Status  Revised            Plan - 10/02/19 1301    Clinical Impression Statement  Patient brought his old AFO in for evaluation. It took patient 25 min to don his AFO and he had to put 2 additional home made straps on top of his AFO to secure it safely as his old straps are not able to be secured properly due to wear. This is an excessive time for him to struggle  with doning AFO as he is using too much energy prior to his ambulation and this will affect his safety and endurance with ambulation. Patient will benefit from new AFO to improve safety during ambulation and also conserve energy.    Personal Factors and Comorbidities  Comorbidity 2;Fitness;Time since onset of injury/illness/exacerbation;Transportation    Comorbidities  hx of falls, HTN, MS    Examination-Activity Limitations  Bed Mobility;Carry;Locomotion Level;Dressing;Stairs;Stand;Transfers;Squat    Examination-Participation Restrictions  Cleaning;Community Activity;Meal Prep;Laundry;Shop    Stability/Clinical Decision Making  Unstable/Unpredictable    Rehab Potential  Fair    PT Frequency  1x / week   Pt only wants to come once a week   PT Duration  8 weeks    PT Treatment/Interventions  ADLs/Self Care Home Management;Gait training;Stair training;Functional mobility training;Therapeutic activities;Therapeutic exercise;Balance training;Wheelchair mobility training;Passive range of motion    PT Next Visit Plan  Gait in parallel bars, try side steps, wheelchair pulls for hamstring activation, trial of rollator, HEP for tone management and strengthening    PT Home Exercise Plan  knee to chest and lower trunk rotations    Consulted and Agree with Plan of Care  Patient       Patient will benefit from skilled therapeutic intervention in order to improve the following deficits and impairments:  Abnormal gait, Decreased activity tolerance, Decreased balance, Decreased endurance, Decreased safety awareness, Decreased range of motion, Decreased mobility, Decreased strength, Difficulty walking, Increased edema, Impaired flexibility, Impaired tone, Impaired vision/preception  Visit Diagnosis: Multiple sclerosis (HCC)  Other abnormalities of gait and mobility  Unsteadiness on feet  Muscle weakness (generalized)     Problem List Patient Active Problem List   Diagnosis Date Noted  .  Quadriplegia  and quadriparesis (HCC) 03/08/2016  . Abnormality of gait 04/22/2013  . HTN (hypertension) 06/29/2012  . Multiple sclerosis (HCC) 06/29/2012  . Benign prostatic hyperplasia with urinary obstruction 06/29/2012    [name], PT 10/02/2019, 1:04 PM  [facility] [address] [place], [place], [address] Phone: [phone]   Fax:  [phone]  Name: MARGIE BRINK MRN: [id number] Date of Birth: August 15, 1960

## 2019-10-06 ENCOUNTER — Telehealth: Payer: Self-pay | Admitting: *Deleted

## 2019-10-06 NOTE — Telephone Encounter (Signed)
----- Message from Glean Salvo, NP sent at 10/06/2019  5:55 AM EDT ----- Regarding: FW: AFO referral Sandy,  Can you follow this-I am not clear a follow-up visit is needed? Can you add your thoughts and help [name] with this.  Thanks,  Sarah  ----- Message ----- From: [name], PT Sent: 10/02/2019   6:24 PM EDT To: Glean Salvo, NP, [name], RN Subject: RE: AFO referral                               Hi Sarah,  I spoke to an orthotist at [facility] and they recommended that we should also have a MD note in the system as a back up. Sometimes insurance companies want to see a MD note along with referral for justification purposes. This will prevent [place] from delaying the procedure any further. I will call [name] and ask him to make an appointment to see you via Telehealth as that will be easier for him  Thank you so much for your support.  [name] ----- Message ----- From: Glean Salvo, NP Sent: 10/02/2019   2:13 PM EDT To: [name], RN, [name], PT Subject: RE: AFO referral                               [name],  Thank you so much for your message and for reaching out.  Absolutely! Whatever you need to help [name], I am more than happy to help! I am coping my nurse on this message as well. Is a virtual visit needed? Could I just order an AFO and he take it to DME company or are you thinking he needs like [employer] actual fitting?  Sarah  ----- Message ----- From: [name], PT Sent: 10/02/2019  12:43 PM EDT To: Glean Salvo, NP Subject: AFO referral                                   Good afternoon Maralyn Sago,  I have been seeing [name] in physical therapy for his gait and balance impairments due to Multiple Sclerosis. Today, I had him bring his old AFO (that he had built over 10 yrs ago) in the clinic so we can see how he uses it. It took him about 25 min to put the AFO on from start to finish. He actually had to put 2 additional home made straps on top  of his AFO to stabilize it as old straps couldn't be secured. He uses this AFO when he has to ambulate with his walker. Because it takes him so long to don/doff this AFO, he is hesitant to put it on and this limits him from walking safety with his rollator.  So long story short, I think he needs a new AFO to improve safety during ambulation. In order for me to proceed with this, we will need a new referral and new MD note justifying need for an AFO. Can I have Daine call your office and make a telehealth appt so you can evaluate his needs for AFO further and make any further recommendations and also put in a referral for "AFO for safe ambulation"?  Thank you for referring [name] to our clinic and please let me know if you have any questions or  concerns.  Markus Jarvis, PT

## 2019-10-06 NOTE — Telephone Encounter (Signed)
Pt has called Sandy,RN back.  He is asking for a call to discuss the brace for his leg

## 2019-10-06 NOTE — Telephone Encounter (Signed)
L AFO after speaking to pt.  I relayed to pt and he was very thankful.

## 2019-10-06 NOTE — Telephone Encounter (Signed)
I called and LMVM for pt to return call aboutr setting up appt (VV) forhi re: the AFO for his leg.

## 2019-10-07 ENCOUNTER — Other Ambulatory Visit: Payer: Self-pay

## 2019-10-07 ENCOUNTER — Ambulatory Visit: Payer: 59

## 2019-10-07 DIAGNOSIS — M6281 Muscle weakness (generalized): Secondary | ICD-10-CM

## 2019-10-07 DIAGNOSIS — G35 Multiple sclerosis: Secondary | ICD-10-CM

## 2019-10-07 DIAGNOSIS — R2681 Unsteadiness on feet: Secondary | ICD-10-CM

## 2019-10-07 DIAGNOSIS — R2689 Other abnormalities of gait and mobility: Secondary | ICD-10-CM

## 2019-10-07 NOTE — Therapy (Signed)
[facility] [address] [place], [place], [address] Phone: [phone]   Fax:  [phone]  Physical Therapy Therapy Re-certification  Patient Details  Name: Randy Shaw MRN: [id number] Date of Birth: 1960/07/24 Referring Provider (PT): Margie Ege, NP   Encounter Date: 10/07/2019  PT End of Session - 10/07/19 1315    Visit Number  4    Number of Visits  9    Date for PT Re-Evaluation  11/07/19    Authorization Type  Eval 09/12/19, Recert 10/07/19    PT Start Time  1100    PT Stop Time  1145    PT Time Calculation (min)  45 min    Equipment Utilized During Treatment  Gait belt    Activity Tolerance  Patient tolerated treatment well    Behavior During Therapy  [facility] for tasks assessed/performed       Past Medical History:  Diagnosis Date  . Abnormality of gait 04/22/2013  . Asthma   . BPH (benign prostatic hyperplasia)   . Hypertension   . Multiple sclerosis (HCC)   . 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 none      There were no vitals filed for this visit.  Subjective Assessment - 10/07/19 1115    Subjective  Pt reports he is doing okay    Pertinent History  BPH, hx of falls, MS, HTN    How long can you sit comfortably?  no limits    How long can you stand comfortably?  2 min    How long can you walk comfortably?  5 steps with holding on to furniture    Patient Stated Goals  Work on strengthening         [facility] PT Assessment - 10/07/19 1134      ROM / Strength   AROM / PROM / Strength  AROM;PROM      AROM   AROM Assessment Site  Ankle;Knee    Right/Left Knee  Right;Left    Right Knee Extension  0    Right Knee Flexion  120    Left Knee Extension  0   Pt has increased tone in quads   Left Knee Flexion  0   Pt unable to flex his knee against mm tone in L quad   Right/Left Ankle  Right;Left    Right Ankle Dorsiflexion  -10     Left Ankle Dorsiflexion  -50   at resting state, patient has 0/5 MMT in L ankle dorsiflexor     PROM   PROM Assessment Site  Knee;Ankle    Right/Left Knee  Right;Left    Right/Left Ankle  Right;Left    Right Ankle Dorsiflexion  5    Left Ankle Dorsiflexion  -20      Strength   Strength Assessment Site  Knee;Ankle    Right/Left Knee  Right;Left    Right Knee Flexion  4+/5    Right Knee Extension  5/5    Left Knee Flexion  1/5    Left Knee Extension  4/5   with assistance from increased tone   Right/Left Ankle  Right;Left    Right Ankle Dorsiflexion  5/5    Right Ankle Plantar Flexion  3+/5    Left Ankle Dorsiflexion  0/5    Left Ankle Plantar Flexion  0/5      LLE Tone   LLE Tone  Modified Ashworth  LLE Tone   Modified Ashworth Scale for Grading Hypertonia LLE  Considerable increase in muschle tone, passive movement difficult   L quads, L plantarflexors          Treatment: Walking in parallel bars, cues for wider stance and keeping feet wider when trunign 180 deg: 4 laps in parallel bar Mini squats in parallel bar: 2 x 10, cues for slow eccentric control using L LE and standing up with pushing heels down and pushing knees back                    PT Short Term Goals - 10/07/19 1302      PT SHORT TERM GOAL #1   Title  Patient will be able to ambulate 10 feet with rolling walker to improve ability to go from bed to bathroom    Baseline  able to ambulate in parallel bars 60 feet, have not attempted with rollator in PT yet.    Time  4    Period  Weeks    Status  On-going    Target Date  10/11/19      PT SHORT TERM GOAL #2   Title  Pt will be able to spend at least 2 hours a day and 3 days a week with using his rollator within his house to improve household ambulation    Baseline  Pt is mainly using his power chair, pt reports of using 30 min with rollator 3 days/week    Time  4    Period  Weeks    Status  On-going    Target Date  10/11/19         PT Long Term Goals - 10/07/19 1306      PT LONG TERM GOAL #1   Title  Patient will be I and compliant with HEP to self manage his flexibility and improve strength    Baseline  not doing any exericses, pt reports improved compliance with HEP    Time  8    Period  Weeks    Status  On-going      PT LONG TERM GOAL #2   Title  Pt will be able to use his rollator for at least 3 hours a day for 3-4 days a week to improve mobility with lesser AD.    Baseline  Only using power chair    Time  8    Period  Weeks    Status  New      PT LONG TERM GOAL #3   Title  Pt will be able to don his AFO under 34min to conserve energy and to improve safe ambulation.    Baseline  currently taking 25 min to don his old AFO (10/02/19)    Time  8    Period  Weeks    Status  Revised            Plan - 10/07/19 1307    Clinical Impression Statement  Patient has been seen for 4 sessions from 09/12/19 to 10/07/19. Patient reports of decreased use of Rollator at home as it is difficult for him to walk without his AFO with his rollator. Putting his AFO takes time and he gets tired. Patient has very poor ankle muscle control on L LE and poor knee strength/control in L LE. Patient will need AFO with knee control to improve gait. Pt is demonstrated improving muscle contraction in L hamstrings from 0/5 to 1/5 with trace contractions. patient still demonstrates  0/5 muscel strength in L ankle grossly.Patient will benefit from external AFO to improve ankle and knee stbaility with gait. patient will benefit from continued skilled PT to improve gait, balance, transfers.    Personal Factors and Comorbidities  Comorbidity 2;Fitness;Time since onset of injury/illness/exacerbation;Transportation    Comorbidities  hx of falls, HTN, MS    Examination-Activity Limitations  Bed Mobility;Carry;Locomotion Level;Dressing;Stairs;Stand;Transfers;Squat    Examination-Participation Restrictions  Cleaning;Community Activity;Meal  Prep;Laundry;Shop    Stability/Clinical Decision Making  Unstable/Unpredictable    Rehab Potential  Fair    PT Frequency  1x / week   Pt only wants to come once a week   PT Duration  8 weeks    PT Treatment/Interventions  ADLs/Self Care Home Management;Gait training;Stair training;Functional mobility training;Therapeutic activities;Therapeutic exercise;Balance training;Wheelchair mobility training;Passive range of motion;Orthotic Fit/Training;Energy conservation;Joint Manipulations;Manual techniques;Electrical Stimulation    PT Next Visit Plan  Gait in parallel bars, try side steps, wheelchair pulls for hamstring activation, trial of rollator, HEP for tone management and strengthening    PT Home Exercise Plan  knee to chest and lower trunk rotations    Consulted and Agree with Plan of Care  Patient       Patient will benefit from skilled therapeutic intervention in order to improve the following deficits and impairments:  Abnormal gait, Decreased activity tolerance, Decreased balance, Decreased endurance, Decreased safety awareness, Decreased range of motion, Decreased mobility, Decreased strength, Difficulty walking, Increased edema, Impaired flexibility, Impaired tone, Impaired vision/preception  Visit Diagnosis: Multiple sclerosis (HCC)  Other abnormalities of gait and mobility  Unsteadiness on feet  Muscle weakness (generalized)     Problem List Patient Active Problem List   Diagnosis Date Noted  . 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  . Abnormality of gait 04/22/2013  . HTN (hypertension) 06/29/2012  . Multiple sclerosis (HCC) 06/29/2012  . Benign prostatic hyperplasia with urinary obstruction 06/29/2012    [name], PT 10/07/2019, 1:21 PM  [facility] [address] [place], [place], [address] Phone: [phone]   Fax:  [phone]  Name: Randy Shaw MRN: [id number] Date of Birth:  1961-02-19

## 2019-10-14 ENCOUNTER — Other Ambulatory Visit: Payer: Self-pay

## 2019-10-14 ENCOUNTER — Ambulatory Visit: Payer: 59

## 2019-10-14 DIAGNOSIS — M6281 Muscle weakness (generalized): Secondary | ICD-10-CM

## 2019-10-14 DIAGNOSIS — G35 Multiple sclerosis: Secondary | ICD-10-CM | POA: Diagnosis not present

## 2019-10-14 DIAGNOSIS — R2689 Other abnormalities of gait and mobility: Secondary | ICD-10-CM

## 2019-10-14 DIAGNOSIS — R2681 Unsteadiness on feet: Secondary | ICD-10-CM

## 2019-10-14 NOTE — Therapy (Signed)
[facility] [address] [place], [place], [address] Phone: [phone]   Fax:  [phone]  Physical Therapy Treatment  Patient Details  Name: Randy Shaw MRN: [id number] Date of Birth: 1960-10-09 Referring Provider (PT): Margie Ege, NP   Encounter Date: 10/14/2019   PT End of Session - 10/14/19 1150    Visit Number 5    Number of Visits 9    Date for PT Re-Evaluation 11/07/19    Authorization Type Eval 09/12/19, Recert 10/07/19    PT Start Time 1105    PT Stop Time 1150    PT Time Calculation (min) 45 min    Equipment Utilized During Treatment Gait belt    Activity Tolerance Patient tolerated treatment well    Behavior During Therapy [facility] for tasks assessed/performed           Past Medical History:  Diagnosis Date  . Abnormality of gait 04/22/2013  . Asthma   . BPH (benign prostatic hyperplasia)   . Hypertension   . Multiple sclerosis (HCC)   . 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 none      There were no vitals filed for this visit.   Subjective Assessment - 10/14/19 1134    Subjective Pt reports he is feeling "gimpy" today. he didn't sleep well last night.    Pertinent History BPH, hx of falls, MS, HTN    How long can you sit comfortably? no limits    How long can you stand comfortably? 2 min    How long can you walk comfortably? 5 steps with holding on to furniture    Patient Stated Goals Work on strengthening                       Treatment: Sit to stand using parallel bars: 5x Walking fwd and bwd: 3 laps of 10 feet Stepping up with R leg: 10x Stepping up with L Leg: 10x with moderate to max A to control L knee and providing tactile cues behind knee to promote flexing and cues to prevent hyperextension. Patient uses arms 80-90% of the time when stepping up with left leg. Requires total A to bring L Leg up and  down when leading with L LE. Standing bil mini squats: mod A to control left knee same cues as above: 15x                 PT Short Term Goals - 10/07/19 1302      PT SHORT TERM GOAL #1   Title Patient will be able to ambulate 10 feet with rolling walker to improve ability to go from bed to bathroom    Baseline able to ambulate in parallel bars 60 feet, have not attempted with rollator in PT yet.    Time 4    Period Weeks    Status On-going    Target Date 10/11/19      PT SHORT TERM GOAL #2   Title Pt will be able to spend at least 2 hours a day and 3 days a week with using his rollator within his house to improve household ambulation    Baseline Pt is mainly using his power chair, pt reports of using 30 min with rollator 3 days/week    Time 4    Period Weeks    Status On-going    Target Date 10/11/19  PT Long Term Goals - 10/07/19 1306      PT LONG TERM GOAL #1   Title Patient will be I and compliant with HEP to self manage his flexibility and improve strength    Baseline not doing any exericses, pt reports improved compliance with HEP    Time 8    Period Weeks    Status On-going      PT LONG TERM GOAL #2   Title Pt will be able to use his rollator for at least 3 hours a day for 3-4 days a week to improve mobility with lesser AD.    Baseline Only using power chair    Time 8    Period Weeks    Status New      PT LONG TERM GOAL #3   Title Pt will be able to don his AFO under to conserve energy and to improve safe ambulation.    Baseline currently taking 25 min to don his old AFO (10/02/19)    Time 8    Period Weeks    Status Revised                 Plan - 10/14/19 1134    Clinical Impression Statement Today's skilled session was focused on improivng WB throught L LE and trying to initiate flexing and extending the L knee in WB position to imporve knee control. patient required mod to max A at L knee to prevent buckling during  stepping up and down. Pt repquires total A to move the leg on top of 4" box and to take it off due to poor hip strength and lack of knee control    Personal Factors and Comorbidities Comorbidity 2;Fitness;Time since onset of injury/illness/exacerbation;Transportation    Comorbidities hx of falls, HTN, MS    Examination-Activity Limitations Bed Mobility;Carry;Locomotion Level;Dressing;Stairs;Stand;Transfers;Squat    Examination-Participation Restrictions Cleaning;Community Activity;Meal Prep;Laundry;Shop    Stability/Clinical Decision Making Unstable/Unpredictable    Rehab Potential Fair    PT Frequency 1x / week   Pt only wants to come once a week   PT Duration 8 weeks    PT Treatment/Interventions ADLs/Self Care Home Management;Gait training;Stair training;Functional mobility training;Therapeutic activities;Therapeutic exercise;Balance training;Wheelchair mobility training;Passive range of motion;Orthotic Fit/Training;Energy conservation;Joint Manipulations;Manual techniques;Electrical Stimulation    PT Next Visit Plan Gait with rollator, mini squats, fwd step ups in parallel bars to promote knee flexion and extension in L LE    PT Home Exercise Plan knee to chest and lower trunk rotations    Consulted and Agree with Plan of Care Patient           Patient will benefit from skilled therapeutic intervention in order to improve the following deficits and impairments:  Abnormal gait, Decreased activity tolerance, Decreased balance, Decreased endurance, Decreased safety awareness, Decreased range of motion, Decreased mobility, Decreased strength, Difficulty walking, Increased edema, Impaired flexibility, Impaired tone, Impaired vision/preception  Visit Diagnosis: Multiple sclerosis (HCC)  Other abnormalities of gait and mobility  Unsteadiness on feet  Muscle weakness (generalized)     Problem List Patient Active Problem List   Diagnosis Date Noted  . Quadriplegia and quadriparesis  (HCC) 03/08/2016  . Abnormality of gait 04/22/2013  . HTN (hypertension) 06/29/2012  . Multiple sclerosis (HCC) 06/29/2012  . Benign prostatic hyperplasia with urinary obstruction 06/29/2012    [name] 10/14/2019, 11:50 AM  [facility] [address] [place], [place], [address] Phone: [phone]   Fax:  [phone]  Name:  MANFRED LASPINA MRN: [id number] Date of Birth: [birth year]

## 2019-10-16 ENCOUNTER — Other Ambulatory Visit: Payer: Self-pay | Admitting: Family Medicine

## 2019-10-16 DIAGNOSIS — I10 Essential (primary) hypertension: Secondary | ICD-10-CM

## 2019-10-16 NOTE — Telephone Encounter (Signed)
10/16/2019 - PATIENT REQUESTED A REFILL ON HIS MICROZIDE 12.5 MG CAPSULE. IT HAS BEEN APPROVED BUT HE WILL NOT BE ABLE TO GET ANY MORE UNTIL HE HAS AN OFFICE VISIT WITH DR. Neva Seat. I TRIED TO CALL AND SCHEDULE BUT HAD TO LEAVE HIM A VOICE MAIL TO RETURN MY CALL. MBC

## 2019-10-16 NOTE — Telephone Encounter (Signed)
Please schedule patient an appt to be seen for any further refills

## 2019-10-16 NOTE — Telephone Encounter (Signed)
Requested  medications are  due for refill today yes  Requested medications are on the active medication list yes  Last refill 07/21/19   Last visit Aug 2020  Future visit scheduled no  Notes to clinic Failed protocol of visit within 6 months

## 2019-10-20 ENCOUNTER — Telehealth: Payer: Self-pay | Admitting: *Deleted

## 2019-10-20 DIAGNOSIS — R269 Unspecified abnormalities of gait and mobility: Secondary | ICD-10-CM

## 2019-10-20 NOTE — Telephone Encounter (Signed)
----- Message from Glean Salvo, NP sent at 10/20/2019  8:46 AM EDT ----- Regarding: FW: AFO referral Can you check on what needs to be done for this please? A DME order? ----- Message ----- From: [name], PT Sent: 10/20/2019   8:27 AM EDT To: Glean Salvo, NP, [name], RN Subject: RE: AFO referral                               Salem Senate,  I saw an addendum on note for Mr. Parkey. Thank you. Can you also put in a referral for AFO for safe ambulation in Epic. I am planning on having [facility] come on 11/04/19 for an evaluation for proper AFO recommendation for him.  Thank you.  [name] ----- Message ----- From: [name], RN Sent: 10/06/2019   8:37 AM EDT To: Glean Salvo, NP, [name], PT Subject: FW: AFO referral                               Hi I LMVM for pt to call me back to set up a VV with sarah.  Im wondering if we can send notes with order for him to [facility] and see if would suffice.  I can speak to [facility] about maybe addending her note since last seen 07/2019 if that would work.  Andrey Campanile RN ----- Message ----- From: Glean Salvo, NP Sent: 10/06/2019   5:55 AM EDT To: [name], RN Subject: FW: AFO referral                               Sandy,  Can you follow this-I am not clear a follow-up visit is needed? Can you add your thoughts and help Derell with this.  Thanks,  Sarah  ----- Message ----- From: [name], PT Sent: 10/02/2019   6:24 PM EDT To: Glean Salvo, NP, [name], RN Subject: RE: AFO referral                               Hi Sarah,  I spoke to an orthotist at [facility] and they recommended that we should also have a MD note in the system as a back up. Sometimes insurance companies want to see a MD note along with referral for justification purposes. This will prevent [place] from delaying the procedure any further. I will call [place] and ask him to make an appointment to see you via Telehealth as that will be  easier for him  Thank you so much for your support.  [name] ----- Message ----- From: Glean Salvo, NP Sent: 10/02/2019   2:13 PM EDT To: [name], RN, [name], PT Subject: RE: AFO referral                               [name],  Thank you so much for your message and for reaching out.  Absolutely! Whatever you need to help Apolo, I am more than happy to help! I am coping my nurse on this message as well. Is a virtual visit needed? Could I just order an AFO and he take it to DME  company or are you thinking he needs like [employer] actual fitting?  Sarah  ----- Message ----- From: Kerrie Pleasure, PT Sent: 10/02/2019  12:43 PM EDT To: Suzzanne Cloud, NP Subject: AFO referral                                   Good afternoon Judson Roch,  I have been seeing Juanda Crumble in physical therapy for his gait and balance impairments due to Multiple Sclerosis. Today, I had him bring his old AFO (that he had built over 10 yrs ago) in the clinic so we can see how he uses it. It took him about 25 min to put the AFO on from start to finish. He actually had to put 2 additional home made straps on top of his AFO to stabilize it as old straps couldn't be secured. He uses this AFO when he has to ambulate with his walker. Because it takes him so long to don/doff this AFO, he is hesitant to put it on and this limits him from walking safety with his rollator.  So long story short, I think he needs a new AFO to improve safety during ambulation. In order for me to proceed with this, we will need a new referral and new MD note justifying need for an AFO. Can I have Amato call your office and make a telehealth appt so you can evaluate his needs for AFO further and make any further recommendations and also put in a referral for "AFO for safe ambulation"?  Thank you for referring Ladarien to our clinic and please let me know if you have any questions or concerns.  Markus Jarvis, PT

## 2019-10-21 ENCOUNTER — Ambulatory Visit: Payer: 59

## 2019-10-21 ENCOUNTER — Other Ambulatory Visit: Payer: Self-pay

## 2019-10-21 DIAGNOSIS — G35 Multiple sclerosis: Secondary | ICD-10-CM | POA: Diagnosis not present

## 2019-10-21 DIAGNOSIS — M6281 Muscle weakness (generalized): Secondary | ICD-10-CM

## 2019-10-21 DIAGNOSIS — R2681 Unsteadiness on feet: Secondary | ICD-10-CM

## 2019-10-21 DIAGNOSIS — R2689 Other abnormalities of gait and mobility: Secondary | ICD-10-CM

## 2019-10-21 NOTE — Therapy (Signed)
[facility] [address] [place], [place], [address] Phone: [phone]   Fax:  [phone]  Physical Therapy Treatment  Patient Details  Name: Randy Shaw MRN: [id number] Date of Birth: 21-Sep-1960 Referring Provider (PT): Margie Ege, NP   Encounter Date: 10/21/2019   PT End of Session - 10/21/19 1242    Visit Number 6    Number of Visits 9    Date for PT Re-Evaluation 11/07/19    Authorization Type Eval 09/12/19, Recert 10/07/19    PT Start Time 1130    PT Stop Time 1215    PT Time Calculation (min) 45 min    Equipment Utilized During Treatment Gait belt    Activity Tolerance Patient tolerated treatment well    Behavior During Therapy [facility] for tasks assessed/performed           Past Medical History:  Diagnosis Date  . Abnormality of gait 04/22/2013  . Asthma   . BPH (benign prostatic hyperplasia)   . Hypertension   . Multiple sclerosis (HCC)   . 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 none      There were no vitals filed for this visit.   Subjective Assessment - 10/21/19 1233    Subjective Pt reports he is trying to walk with walker at home but it wears him out. He doesn't wear AFO at home with walking.    Patient is accompained by: --    Pertinent History BPH, hx of falls, MS, HTN    How long can you sit comfortably? no limits    How long can you stand comfortably? 2 min    How long can you walk comfortably? 5 steps with holding on to furniture    Patient Stated Goals Work on strengthening    Currently in Pain? No/denies           treatment Manually stretched bil hip extensors, piriformis, hamstrings, bil calves Foam roll massage to bil quads to decrease tone and bil anterior tibialis  Gait training: With rolling walker: 30' + 85': with min A required to move L LE forward on occasion, pt cued to perform heel raise on R to  improve foot clearance on L LE during swing phase, shoe socks applied to improve foot sliding on floor (without AFO)                          PT Education - 10/21/19 1246    Education Details Pt educated to bring AFO on 11/04/19 for orthotist evaluation.            PT Short Term Goals - 10/07/19 1302      PT SHORT TERM GOAL #1   Title Patient will be able to ambulate 10 feet with rolling walker to improve ability to go from bed to bathroom    Baseline able to ambulate in parallel bars 60 feet, have not attempted with rollator in PT yet.    Time 4    Period Weeks    Status On-going    Target Date 10/11/19      PT SHORT TERM GOAL #2   Title Pt will be able to spend at least 2 hours a day and 3 days a week with using his rollator within his house to improve household ambulation    Baseline Pt is mainly using his power chair, pt reports of using  30 min with rollator 3 days/week    Time 4    Period Weeks    Status On-going    Target Date 10/11/19             PT Long Term Goals - 10/07/19 1306      PT LONG TERM GOAL #1   Title Patient will be I and compliant with HEP to self manage his flexibility and improve strength    Baseline not doing any exericses, pt reports improved compliance with HEP    Time 8    Period Weeks    Status On-going      PT LONG TERM GOAL #2   Title Pt will be able to use his rollator for at least 3 hours a day for 3-4 days a week to improve mobility with lesser AD.    Baseline Only using power chair    Time 8    Period Weeks    Status New      PT LONG TERM GOAL #3   Title Pt will be able to don his AFO under 40min to conserve energy and to improve safe ambulation.    Baseline currently taking 25 min to don his old AFO (10/02/19)    Time 8    Period Weeks    Status Revised                 Plan - 10/21/19 1241    Clinical Impression Statement Noted improved tone in L LE with bed mobility and transfers after the  stretching. patient was able to ambulate 115' total with min A with rolling walker.    Personal Factors and Comorbidities Comorbidity 2;Fitness;Time since onset of injury/illness/exacerbation;Transportation    Comorbidities hx of falls, HTN, MS    Examination-Activity Limitations Bed Mobility;Carry;Locomotion Level;Dressing;Stairs;Stand;Transfers;Squat    Examination-Participation Restrictions Cleaning;Community Activity;Meal Prep;Laundry;Shop    Stability/Clinical Decision Making Unstable/Unpredictable    Rehab Potential Fair    PT Frequency 1x / week   Pt only wants to come once a week   PT Duration 8 weeks    PT Treatment/Interventions ADLs/Self Care Home Management;Gait training;Stair training;Functional mobility training;Therapeutic activities;Therapeutic exercise;Balance training;Wheelchair mobility training;Passive range of motion;Orthotic Fit/Training;Energy conservation;Joint Manipulations;Manual techniques;Electrical Stimulation    PT Next Visit Plan Gait with rollator, mini squats, fwd step ups in parallel bars to promote knee flexion and extension in L LE    PT Home Exercise Plan knee to chest and lower trunk rotations    Consulted and Agree with Plan of Care Patient           Patient will benefit from skilled therapeutic intervention in order to improve the following deficits and impairments:  Abnormal gait, Decreased activity tolerance, Decreased balance, Decreased endurance, Decreased safety awareness, Decreased range of motion, Decreased mobility, Decreased strength, Difficulty walking, Increased edema, Impaired flexibility, Impaired tone, Impaired vision/preception  Visit Diagnosis: Multiple sclerosis ([place])  Other abnormalities of gait and mobility  Unsteadiness on feet  Muscle weakness (generalized)     Problem List Patient Active Problem List   Diagnosis Date Noted  . Quadriplegia and quadriparesis ([place]) 03/08/2016  . Abnormality of gait 04/22/2013  . HTN  (hypertension) 06/29/2012  . Multiple sclerosis ([place]) 06/29/2012  . Benign prostatic hyperplasia with urinary obstruction 06/29/2012    Randy Shaw 10/21/2019, 12:46 PM  [facility] [address] [place], [place], [address] Phone: [phone]   Fax:  [phone]  Name: Randy Shaw MRN: [id number] Date of  Birth: 06-11-1960

## 2019-10-22 NOTE — Telephone Encounter (Signed)
dme order for AFO placed.

## 2019-10-27 ENCOUNTER — Other Ambulatory Visit: Payer: Self-pay

## 2019-10-27 ENCOUNTER — Ambulatory Visit (INDEPENDENT_AMBULATORY_CARE_PROVIDER_SITE_OTHER): Payer: 59 | Admitting: Family Medicine

## 2019-10-27 DIAGNOSIS — H938X3 Other specified disorders of ear, bilateral: Secondary | ICD-10-CM | POA: Diagnosis not present

## 2019-10-27 DIAGNOSIS — R739 Hyperglycemia, unspecified: Secondary | ICD-10-CM

## 2019-10-27 DIAGNOSIS — G35 Multiple sclerosis: Secondary | ICD-10-CM

## 2019-10-27 DIAGNOSIS — I10 Essential (primary) hypertension: Secondary | ICD-10-CM

## 2019-10-27 DIAGNOSIS — R079 Chest pain, unspecified: Secondary | ICD-10-CM | POA: Diagnosis not present

## 2019-10-27 DIAGNOSIS — R9431 Abnormal electrocardiogram [ECG] [EKG]: Secondary | ICD-10-CM

## 2019-10-27 DIAGNOSIS — R600 Localized edema: Secondary | ICD-10-CM

## 2019-10-27 NOTE — Patient Instructions (Addendum)
Try to cut back on sodium in the diet (frozen foods sometimes have a lot of sodium - check labels), also stop alcohol for now.  If sound in ears not improved with better blood pressure, can discuss other testing.  I will refer you to cardiology to discuss your previous chest pain, review EKG as well as leg swelling.  You should receive a call within the next week or 2.  If any acute worsening of symptoms, return of chest pain or other worsening symptoms be seen in the emergency room.   Try to elevate legs when seated to help with swelling, but less sodium should help.   recheck in 2 weeks with home blood pressure readings.  Bring your meter.    How to Take Your Blood Pressure You can take your blood pressure at home with a machine. You may need to check your blood pressure at home:  To check if you have high blood pressure (hypertension).  To check your blood pressure over time.  To make sure your blood pressure medicine is working. Supplies needed: You will need a blood pressure machine, or monitor. You can buy one at a drugstore or online. When choosing one:  Choose one with an arm cuff.  Choose one that wraps around your upper arm. Only one finger should fit between your arm and the cuff.  Do not choose one that measures your blood pressure from your wrist or finger. Your doctor can suggest a monitor. How to prepare Avoid these things for 30 minutes before checking your blood pressure:  Drinking caffeine.  Drinking alcohol.  Eating.  Smoking.  Exercising. Five minutes before checking your blood pressure:  Pee.  Sit in a dining chair. Avoid sitting in a soft couch or armchair.  Be quiet. Do not talk. How to take your blood pressure Follow the instructions that came with your machine. If you have a digital blood pressure monitor, these may be the instructions: 1. Sit up straight. 2. Place your feet on the floor. Do not cross your ankles or legs. 3. Rest your left  arm at the level of your heart. You may rest it on a table, desk, or chair. 4. Pull up your shirt sleeve. 5. Wrap the blood pressure cuff around the upper part of your left arm. The cuff should be 1 inch (2.5 cm) above your elbow. It is best to wrap the cuff around bare skin. 6. Fit the cuff snugly around your arm. You should be able to place only one finger between the cuff and your arm. 7. Put the cord inside the groove of your elbow. 8. Press the power button. 9. Sit quietly while the cuff fills with air and loses air. 10. Write down the numbers on the screen. 11. Wait 2-3 minutes and then repeat steps 1-10. What do the numbers mean? Two numbers make up your blood pressure. The first number is called systolic pressure. The second is called diastolic pressure. An example of a blood pressure reading is "120 over 80" (or 120/80). If you are an adult and do not have a medical condition, use this guide to find out if your blood pressure is normal: Normal  First number: below 120.  Second number: below 80. Elevated  First number: 120-129.  Second number: below 80. Hypertension stage 1  First number: 130-139.  Second number: 80-89. Hypertension stage 2  First number: 140 or above.  Second number: 25 or above. Your blood pressure is above normal even  if only the top or bottom number is above normal. Follow these instructions at home:  Check your blood pressure as often as your doctor tells you to.  Take your monitor to your next doctor's appointment. Your doctor will: ? Make sure you are using it correctly. ? Make sure it is working right.  Make sure you understand what your blood pressure numbers should be.  Tell your doctor if your medicines are causing side effects. Contact a doctor if:  Your blood pressure keeps being high. Get help right away if:  Your first blood pressure number is higher than 180.  Your second blood pressure number is higher than 120. This  information is not intended to replace advice given to you by your health care provider. Make sure you discuss any questions you have with your health care provider. Document Revised: 03/30/2017 Document Reviewed: 09/24/2015 Elsevier Patient Education  2020 [employer]   Nonspecific Chest Pain, Adult Chest pain can be caused by many different conditions. It can be caused by a condition that is life-threatening and requires treatment right away. It can also be caused by something that is not life-threatening. If you have chest pain, it can be hard to know the difference, so it is important to get help right away to make sure that you do not have a serious condition. Some life-threatening causes of chest pain include:  Heart attack.  A tear in the body's main blood vessel (aortic dissection).  Inflammation around your heart (pericarditis).  A problem in the lungs, such as a blood clot (pulmonary embolism) or a collapsed lung (pneumothorax). Some non life-threatening causes of chest pain include:  Heartburn.  Anxiety or stress.  Damage to the bones, muscles, and cartilage that make up your chest wall.  Pneumonia or bronchitis.  Shingles infection (varicella-zoster virus). Chest pain can feel like:  Pain or discomfort on the surface of your chest or deep in your chest.  Crushing, pressure, aching, or squeezing pain.  Burning or tingling.  Dull or sharp pain that is worse when you move, cough, or take a deep breath.  Pain or discomfort that is also felt in your back, neck, jaw, shoulder, or arm, or pain that spreads to any of these areas. Your chest pain may come and go. It may also be constant. Your health care provider will do lab tests and other studies to find the cause of your pain. Treatment will depend on the cause of your chest pain. Follow these instructions at home: Medicines  Take over-the-counter and prescription medicines only as told by your health care  provider.  If you were prescribed an antibiotic, take it as told by your health care provider. Do not stop taking the antibiotic even if you start to feel better. Lifestyle   Rest as directed by your health care provider.  Do not use any products that contain nicotine or tobacco, such as cigarettes and e-cigarettes. If you need help quitting, ask your health care provider.  Do not drink alcohol.  Make healthy lifestyle choices as recommended. These may include: ? Getting regular exercise. Ask your health care provider to suggest some activities that are safe for you. ? Eating a heart-healthy diet. This includes plenty of fresh fruits and vegetables, whole grains, low-fat (lean) protein, and low-fat dairy products. A dietitian can help you find healthy eating options. ? Maintaining a healthy weight. ? Managing any other health conditions you have, such as high blood pressure (hypertension) or diabetes. ? Reducing  stress, such as with yoga or relaxation techniques. General instructions  Pay attention to any changes in your symptoms. Tell your health care provider about them or any new symptoms.  Avoid any activities that cause chest pain.  Keep all follow-up visits as told by your health care provider. This is important. This includes visits for any further testing if your chest pain does not go away. Contact a health care provider if:  Your chest pain does not go away.  You feel depressed.  You have a fever. Get help right away if:  Your chest pain gets worse.  You have a cough that gets worse, or you cough up blood.  You have severe pain in your abdomen.  You faint.  You have sudden, unexplained chest discomfort.  You have sudden, unexplained discomfort in your arms, back, neck, or jaw.  You have shortness of breath at any time.  You suddenly start to sweat, or your skin gets clammy.  You feel nausea or you vomit.  You suddenly feel lightheaded or dizzy.  You  have severe weakness, or unexplained weakness or fatigue.  Your heart begins to beat quickly, or it feels like it is skipping beats. These symptoms may represent a serious problem that is an emergency. Do not wait to see if the symptoms will go away. Get medical help right away. Call your local emergency services (911 in the [place] Do not drive yourself to the hospital. Summary  Chest pain can be caused by a condition that is serious and requires urgent treatment. It may also be caused by something that is not life-threatening.  If you have chest pain, it is very important to see your health care provider. Your health care provider may do lab tests and other studies to find the cause of your pain.  Follow your health care provider's instructions on taking medicines, making lifestyle changes, and getting emergency treatment if symptoms become worse.  Keep all follow-up visits as told by your health care provider. This includes visits for any further testing if your chest pain does not go away. This information is not intended to replace advice given to you by your health care provider. Make sure you discuss any questions you have with your health care provider. Document Revised: 10/18/2017 Document Reviewed: 10/18/2017 Elsevier Patient Education  2020 [employer]  Edema  Edema is an abnormal buildup of fluids in the body tissues and under the skin. Swelling of the legs, feet, and ankles is a common symptom that becomes more likely as you get older. Swelling is also common in looser tissues, like around the eyes. When the affected area is squeezed, the fluid may move out of that spot and leave a dent for a few moments. This dent is called pitting edema. There are many possible causes of edema. Eating too much salt (sodium) and being on your feet or sitting for a long time can cause edema in your legs, feet, and ankles. Hot weather may make edema worse. Common causes of edema include:  Heart  failure.  Liver or kidney disease.  Weak leg blood vessels.  Cancer.  An injury.  Pregnancy.  Medicines.  Being obese.  Low protein levels in the blood. Edema is usually painless. Your skin may look swollen or shiny. Follow these instructions at home:  Keep the affected body part raised (elevated) above the level of your heart when you are sitting or lying down.  Do not sit still or stand for long periods  of time.  Do not wear tight clothing. Do not wear garters on your upper legs.  Exercise your legs to get your circulation going. This helps to move the fluid back into your blood vessels, and it may help the swelling go down.  Wear elastic bandages or support stockings to reduce swelling as told by your health care provider.  Eat a low-salt (low-sodium) diet to reduce fluid as told by your health care provider.  Depending on the cause of your swelling, you may need to limit how much fluid you drink (fluid restriction).  Take over-the-counter and prescription medicines only as told by your health care provider. Contact a health care provider if:  Your edema does not get better with treatment.  You have heart, liver, or kidney disease and have symptoms of edema.  You have sudden and unexplained weight gain. Get help right away if:  You develop shortness of breath or chest pain.  You cannot breathe when you lie down.  You develop pain, redness, or warmth in the swollen areas.  You have heart, liver, or kidney disease and suddenly get edema.  You have a fever and your symptoms suddenly get worse. Summary  Edema is an abnormal buildup of fluids in the body tissues and under the skin.  Eating too much salt (sodium) and being on your feet or sitting for a long time can cause edema in your legs, feet, and ankles.  Keep the affected body part raised (elevated) above the level of your heart when you are sitting or lying down. This information is not intended to  replace advice given to you by your health care provider. Make sure you discuss any questions you have with your health care provider. Document Revised: 09/04/2018 Document Reviewed: 05/20/2016 Elsevier Patient Education  [employer].    If you have lab work done today you will be contacted with your lab results within the next 2 weeks.  If you have not heard from [place] then please contact us. The fastest way to get your results is to register for My Chart.   IF you received an x-ray today, you will receive an invoice from [facility]. Please contact [facility] at [phone] with questions or concerns regarding your invoice.   IF you received labwork today, you will receive an invoice from [facility]. Please contact [facility] at [phone] with questions or concerns regarding your invoice.   Our billing staff will not be able to assist you with questions regarding bills from these companies.  You will be contacted with the lab results as soon as they are available. The fastest way to get your results is to activate your My Chart account. Instructions are located on the last page of this paperwork. If you have not heard from [place] regarding the results in 2 weeks, please contact this office.

## 2019-10-27 NOTE — Progress Notes (Signed)
Subjective:  Patient ID: Randy Shaw, male    DOB: 1961-02-15  Age: 59 y.o. MRN: [id number]  CC:  Chief Complaint  Patient presents with  . Hypertension    pt denies side effects states theyre working well pt needs a refill.   HPI Randy Shaw presents for   Hypertension: Treated with hydrochlorothiazide 12.5 mg daily.no missed doses.  No new side effects. Home readings: high few days ago - did not seem accurate - 150?  Elevated BP few times at PT.  No change in diet, but has always consumed frozen foods.  Alcohol: one per day only.  BP Readings from Last 3 Encounters:  08/06/19 (!) 142/88  02/05/19 118/82  01/31/19 132/72   Lab Results  Component Value Date   CREATININE 0.89 10/27/2019   Chest pain: Noticed a week or two ago doing dishes. No current pain.  Lasted 30 minutes. Unable to reproduce. Possible prior episode few days prior.  Center chest, no arm radiation. Prior R arm pain at times. No associated shortness of breath, n/v or diaphoresis.  No pain with PT or other exercise.  No prior heart disease known.   Leg swelling foot more than right past few months. No calf pain. Left leg with minimal use.   Father and daughter with diabetes. Glucose 105 on 08/06/19 labs.   pounding sounds in both ears when lying down only, like heartbeat. Only with lying down. Past few weeks. Hearing ok. No nasal congestion, no sneezing. No current allergy meds - has had allergies in past.   Multiple sclerosis Followed by [facility] neurology, appointment with Margie Ege April 7.  Physical therapy ordered with previous falls and loss of balance.  Remain on Tecfidera, baclofen individual.  New AFO brace was provided.  Gait and balance issues as a result of MS. Uses motorized wheelchair with history of quadriplegia, quadriparesis.  Can stand and walk short distances if holding onto something.  Previously followed by podiatry for right foot ulcer.  PT has been doing well - helping  with balance. Most recent fall last week. No injury. Had to have assistance. 2 falls since starting PT on 5/14. History Patient Active Problem List   Diagnosis Date Noted  . 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  . Abnormality of gait 04/22/2013  . HTN (hypertension) 06/29/2012  . Multiple sclerosis (HCC) 06/29/2012  . Benign prostatic hyperplasia with urinary obstruction 06/29/2012   Past Medical History:  Diagnosis Date  . Abnormality of gait 04/22/2013  . Asthma   . BPH (benign prostatic hyperplasia)   . Hypertension   . Multiple sclerosis (HCC)   . 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   Past Surgical History:  Procedure Laterality Date  . none     Allergies  Allergen Reactions  . Peanut-Containing Drug Products Shortness Of Breath  . Zanaflex [Tizanidine Hcl] Shortness Of Breath   Prior to Admission medications   Medication Sig Start Date End Date Taking? Authorizing Provider  Armodafinil 150 MG tablet Take 1 tablet (150 mg total) by mouth daily. 06/26/19  Yes Glean Salvo, NP  baclofen (LIORESAL) 20 MG tablet TAKE 1 TABLET BY MOUTH IN THE MORNING, 1 TABLET AT NOON, AND 2 TABLETS AT NIGHT 08/06/19  Yes Glean Salvo, NP  hydrochlorothiazide (MICROZIDE) 12.5 MG capsule TAKE 1 CAPSULE(12.5 MG) BY MOUTH DAILY 10/16/19  Yes Shade Flood, MD  MYRBETRIQ 50 MG TB24 tablet  02/16/19  Yes [provider]  tamsulosin (FLOMAX) 0.4 MG CAPS  capsule Take 1 capsule (0.4 mg total) by mouth daily. 07/01/14  Yes Tonye Pearson, MD  TECFIDERA 240 MG CPDR Take 1 capsule by mouth 2 (two) times daily. 07/24/19  Yes [provider]   Social History   Socioeconomic History  . Marital status: Divorced    Spouse name: n/a  . Number of children: 3  . Years of education: 58  . Highest education level: Not on file  Occupational History  . Occupation: disabled/retired 1999-[place]    Comment: Event organiser  Tobacco Use  . Smoking status: Never  Smoker  . Smokeless tobacco: Never Used  Substance and Sexual Activity  . Alcohol use: Yes    Alcohol/week: 0.0 standard drinks    Comment: occasionally wine cooler  . Drug use: No  . Sexual activity: Never  Other Topics Concern  . Not on file  Social History Narrative   Lives alone, house.  3 children.  Caffeine none.  Green Tea once week.     Social Determinants of Health   Financial Resource Strain:   . Difficulty of Paying Living Expenses:   Food Insecurity:   . Worried About Programme researcher, broadcasting/film/video in the Last Year:   . Barista in the Last Year:   Transportation Needs:   . Freight forwarder (Medical):   Marland Kitchen Lack of Transportation (Non-Medical):   Physical Activity:   . Days of Exercise per Week:   . Minutes of Exercise per Session:   Stress:   . Feeling of Stress :   Social Connections:   . Frequency of Communication with Friends and Family:   . Frequency of Social Gatherings with Friends and Family:   . Attends Religious Services:   . Active Member of Clubs or Organizations:   . Attends Banker Meetings:   Marland Kitchen Marital Status:   Intimate Partner Violence:   . Fear of Current or Ex-Partner:   . Emotionally Abused:   Marland Kitchen Physically Abused:   . Sexually Abused:     Review of Systems  Constitutional: Negative for fatigue and unexpected weight change.  Eyes: Negative for visual disturbance.  Respiratory: Negative for cough, chest tightness and shortness of breath.   Cardiovascular: Positive for chest pain and leg swelling (both lower legs, no calf pain. ). Negative for palpitations.  Gastrointestinal: Negative for abdominal pain and blood in stool.  Neurological: Negative for dizziness, light-headedness and headaches.     Objective:  There were no vitals filed for this visit.   Physical Exam Vitals reviewed.  Constitutional:      Appearance: He is well-developed.  HENT:     Head: Normocephalic and atraumatic.  Eyes:     Pupils: Pupils are  equal, round, and reactive to light.  Neck:     Vascular: No carotid bruit or JVD.  Cardiovascular:     Rate and Rhythm: Normal rate and regular rhythm.     Heart sounds: Normal heart sounds. No murmur heard.   Pulmonary:     Effort: Pulmonary effort is normal.     Breath sounds: Normal breath sounds. No rales.  Musculoskeletal:     Right lower leg: Edema present.     Left lower leg: Edema (increased R ankle vs. left, but bilat edema. 2+R. 1+L , calf circumfernece equal at 15cm below patella, calves nt. ) present.  Skin:    General: Skin is warm and dry.  Neurological:     Mental Status: He is alert  and oriented to person, place, and time.    EKG, no prior EKG available for review.  Sinus rhythm, rate 79, PR interval 234 with first-degree AV block.  Artifact V2.  No apparent acute findings otherwise.  Some artifact in lead IIII.   Assessment & Plan:  Randy Shaw is a 59 y.o. male . Essential hypertension - Plan: Basic metabolic panel  -Mildly elevated at outside readings.  Continue same regimen for now, attempt to cut back on sodium in the diet with frozen foods, stop alcohol for now, monitor home readings.  Nonspecific abnormal electrocardiogram (ECG) (EKG) - Plan: Ambulatory referral to Cardiology Chest pain, unspecified type - Plan: EKG 12-Lead, Ambulatory referral to Cardiology  -2 episodes, asymptomatic at present.  Few borderline possible abnormalities on EKG with first-degree block.  No prior for comparison.  Will refer to cardiology to review EKG, decide on further testing.  ER precautions  Abnormal ear sensation, bilateral  -Describes possible pulsatile tinnitus.  Only noted with lying down.  May be related to blood pressure.  Initially attempt improved control as above with decrease frozen foods, alcohol, consider med changes if persistent elevations.  Consider ENT referral if persistent tinnitus.  MS (multiple sclerosis) (HCC)  -Followed by neurology.  Continue same  regimen.  Currently in PT for strengthening and for fall prevention  Hyperglycemia - Plan: Hemoglobin A1c  -Mild on previous labs, check A1c  Bilateral leg edema - Plan: Ambulatory referral to Cardiology  -As above will try to decrease sodium intake.  Lungs clear.  Doubt CHF.  Cardiology eval as above.  No orders of the defined types were placed in this encounter.  Patient Instructions    Try to cut back on sodium in the diet (frozen foods sometimes have a lot of sodium - check labels), also stop alcohol for now.  If sound in ears not improved with better blood pressure, can discuss other testing.  I will refer you to cardiology to discuss your previous chest pain, review EKG as well as leg swelling.  You should receive a call within the next week or 2.  If any acute worsening of symptoms, return of chest pain or other worsening symptoms be seen in the emergency room.   Try to elevate legs when seated to help with swelling, but less sodium should help.   recheck in 2 weeks with home blood pressure readings.  Bring your meter.    How to Take Your Blood Pressure You can take your blood pressure at home with a machine. You may need to check your blood pressure at home:  To check if you have high blood pressure (hypertension).  To check your blood pressure over time.  To make sure your blood pressure medicine is working. Supplies needed: You will need a blood pressure machine, or monitor. You can buy one at a drugstore or online. When choosing one:  Choose one with an arm cuff.  Choose one that wraps around your upper arm. Only one finger should fit between your arm and the cuff.  Do not choose one that measures your blood pressure from your wrist or finger. Your doctor can suggest a monitor. How to prepare Avoid these things for 30 minutes before checking your blood pressure:  Drinking caffeine.  Drinking alcohol.  Eating.  Smoking.  Exercising. Five minutes before  checking your blood pressure:  Pee.  Sit in a dining chair. Avoid sitting in a soft couch or armchair.  Be quiet. Do not talk.  How to take your blood pressure Follow the instructions that came with your machine. If you have a digital blood pressure monitor, these may be the instructions: 1. Sit up straight. 2. Place your feet on the floor. Do not cross your ankles or legs. 3. Rest your left arm at the level of your heart. You may rest it on a table, desk, or chair. 4. Pull up your shirt sleeve. 5. Wrap the blood pressure cuff around the upper part of your left arm. The cuff should be 1 inch (2.5 cm) above your elbow. It is best to wrap the cuff around bare skin. 6. Fit the cuff snugly around your arm. You should be able to place only one finger between the cuff and your arm. 7. Put the cord inside the groove of your elbow. 8. Press the power button. 9. Sit quietly while the cuff fills with air and loses air. 10. Write down the numbers on the screen. 11. Wait 2-3 minutes and then repeat steps 1-10. What do the numbers mean? Two numbers make up your blood pressure. The first number is called systolic pressure. The second is called diastolic pressure. An example of a blood pressure reading is "120 over 80" (or 120/80). If you are an adult and do not have a medical condition, use this guide to find out if your blood pressure is normal: Normal  First number: below 120.  Second number: below 80. Elevated  First number: 120-129.  Second number: below 80. Hypertension stage 1  First number: 130-139.  Second number: 80-89. Hypertension stage 2  First number: 140 or above.  Second number: 72 or above. Your blood pressure is above normal even if only the top or bottom number is above normal. Follow these instructions at home:  Check your blood pressure as often as your doctor tells you to.  Take your monitor to your next doctor's appointment. Your doctor will: ? Make sure you  are using it correctly. ? Make sure it is working right.  Make sure you understand what your blood pressure numbers should be.  Tell your doctor if your medicines are causing side effects. Contact a doctor if:  Your blood pressure keeps being high. Get help right away if:  Your first blood pressure number is higher than 180.  Your second blood pressure number is higher than 120. This information is not intended to replace advice given to you by your health care provider. Make sure you discuss any questions you have with your health care provider. Document Revised: 03/30/2017 Document Reviewed: 09/24/2015 Elsevier Patient Education  [place].   Nonspecific Chest Pain, Adult Chest pain can be caused by many different conditions. It can be caused by a condition that is life-threatening and requires treatment right away. It can also be caused by something that is not life-threatening. If you have chest pain, it can be hard to know the difference, so it is important to get help right away to make sure that you do not have a serious condition. Some life-threatening causes of chest pain include:  Heart attack.  A tear in the body's main blood vessel (aortic dissection).  Inflammation around your heart (pericarditis).  A problem in the lungs, such as a blood clot (pulmonary embolism) or a collapsed lung (pneumothorax). Some non life-threatening causes of chest pain include:  Heartburn.  Anxiety or stress.  Damage to the bones, muscles, and cartilage that make up your chest wall.  Pneumonia or bronchitis.  Shingles  infection (varicella-zoster virus). Chest pain can feel like:  Pain or discomfort on the surface of your chest or deep in your chest.  Crushing, pressure, aching, or squeezing pain.  Burning or tingling.  Dull or sharp pain that is worse when you move, cough, or take a deep breath.  Pain or discomfort that is also felt in your back, neck, jaw, shoulder,  or arm, or pain that spreads to any of these areas. Your chest pain may come and go. It may also be constant. Your health care provider will do lab tests and other studies to find the cause of your pain. Treatment will depend on the cause of your chest pain. Follow these instructions at home: Medicines  Take over-the-counter and prescription medicines only as told by your health care provider.  If you were prescribed an antibiotic, take it as told by your health care provider. Do not stop taking the antibiotic even if you start to feel better. Lifestyle   Rest as directed by your health care provider.  Do not use any products that contain nicotine or tobacco, such as cigarettes and e-cigarettes. If you need help quitting, ask your health care provider.  Do not drink alcohol.  Make healthy lifestyle choices as recommended. These may include: ? Getting regular exercise. Ask your health care provider to suggest some activities that are safe for you. ? Eating a heart-healthy diet. This includes plenty of fresh fruits and vegetables, whole grains, low-fat (lean) protein, and low-fat dairy products. A dietitian can help you find healthy eating options. ? Maintaining a healthy weight. ? Managing any other health conditions you have, such as high blood pressure (hypertension) or diabetes. ? Reducing stress, such as with yoga or relaxation techniques. General instructions  Pay attention to any changes in your symptoms. Tell your health care provider about them or any new symptoms.  Avoid any activities that cause chest pain.  Keep all follow-up visits as told by your health care provider. This is important. This includes visits for any further testing if your chest pain does not go away. Contact a health care provider if:  Your chest pain does not go away.  You feel depressed.  You have a fever. Get help right away if:  Your chest pain gets worse.  You have a cough that gets worse, or  you cough up blood.  You have severe pain in your abdomen.  You faint.  You have sudden, unexplained chest discomfort.  You have sudden, unexplained discomfort in your arms, back, neck, or jaw.  You have shortness of breath at any time.  You suddenly start to sweat, or your skin gets clammy.  You feel nausea or you vomit.  You suddenly feel lightheaded or dizzy.  You have severe weakness, or unexplained weakness or fatigue.  Your heart begins to beat quickly, or it feels like it is skipping beats. These symptoms may represent a serious problem that is an emergency. Do not wait to see if the symptoms will go away. Get medical help right away. Call your local emergency services (911 in the [place] Do not drive yourself to the hospital. Summary  Chest pain can be caused by a condition that is serious and requires urgent treatment. It may also be caused by something that is not life-threatening.  If you have chest pain, it is very important to see your health care provider. Your health care provider may do lab tests and other studies to find the cause of your  pain.  Follow your health care provider's instructions on taking medicines, making lifestyle changes, and getting emergency treatment if symptoms become worse.  Keep all follow-up visits as told by your health care provider. This includes visits for any further testing if your chest pain does not go away. This information is not intended to replace advice given to you by your health care provider. Make sure you discuss any questions you have with your health care provider. Document Revised: 10/18/2017 Document Reviewed: 10/18/2017 Elsevier Patient Education  2020 Elsevier Inc.  Edema  Edema is an abnormal buildup of fluids in the body tissues and under the skin. Swelling of the legs, feet, and ankles is a common symptom that becomes more likely as you get older. Swelling is also common in looser tissues, like around the eyes.  When the affected area is squeezed, the fluid may move out of that spot and leave a dent for a few moments. This dent is called pitting edema. There are many possible causes of edema. Eating too much salt (sodium) and being on your feet or sitting for a long time can cause edema in your legs, feet, and ankles. Hot weather may make edema worse. Common causes of edema include:  Heart failure.  Liver or kidney disease.  Weak leg blood vessels.  Cancer.  An injury.  Pregnancy.  Medicines.  Being obese.  Low protein levels in the blood. Edema is usually painless. Your skin may look swollen or shiny. Follow these instructions at home:  Keep the affected body part raised (elevated) above the level of your heart when you are sitting or lying down.  Do not sit still or stand for long periods of time.  Do not wear tight clothing. Do not wear garters on your upper legs.  Exercise your legs to get your circulation going. This helps to move the fluid back into your blood vessels, and it may help the swelling go down.  Wear elastic bandages or support stockings to reduce swelling as told by your health care provider.  Eat a low-salt (low-sodium) diet to reduce fluid as told by your health care provider.  Depending on the cause of your swelling, you may need to limit how much fluid you drink (fluid restriction).  Take over-the-counter and prescription medicines only as told by your health care provider. Contact a health care provider if:  Your edema does not get better with treatment.  You have heart, liver, or kidney disease and have symptoms of edema.  You have sudden and unexplained weight gain. Get help right away if:  You develop shortness of breath or chest pain.  You cannot breathe when you lie down.  You develop pain, redness, or warmth in the swollen areas.  You have heart, liver, or kidney disease and suddenly get edema.  You have a fever and your symptoms suddenly  get worse. Summary  Edema is an abnormal buildup of fluids in the body tissues and under the skin.  Eating too much salt (sodium) and being on your feet or sitting for a long time can cause edema in your legs, feet, and ankles.  Keep the affected body part raised (elevated) above the level of your heart when you are sitting or lying down. This information is not intended to replace advice given to you by your health care provider. Make sure you discuss any questions you have with your health care provider. Document Revised: 09/04/2018 Document Reviewed: 05/20/2016 Elsevier Patient Education  2020 [employer].  If you have lab work done today you will be contacted with your lab results within the next 2 weeks.  If you have not heard from [place] then please contact us. The fastest way to get your results is to register for My Chart.   IF you received an x-ray today, you will receive an invoice from [facility]. Please contact [facility] at [phone] with questions or concerns regarding your invoice.   IF you received labwork today, you will receive an invoice from [facility]. Please contact [facility] at [phone] with questions or concerns regarding your invoice.   Our billing staff will not be able to assist you with questions regarding bills from these companies.  You will be contacted with the lab results as soon as they are available. The fastest way to get your results is to activate your My Chart account. Instructions are located on the last page of this paperwork. If you have not heard from [place] regarding the results in 2 weeks, please contact this office.         Signed, Merri Ray, MD [facility]

## 2019-10-28 ENCOUNTER — Encounter: Payer: Self-pay | Admitting: Family Medicine

## 2019-10-28 ENCOUNTER — Ambulatory Visit: Payer: 59

## 2019-10-28 DIAGNOSIS — G35 Multiple sclerosis: Secondary | ICD-10-CM | POA: Diagnosis not present

## 2019-10-28 DIAGNOSIS — M6281 Muscle weakness (generalized): Secondary | ICD-10-CM

## 2019-10-28 DIAGNOSIS — R2689 Other abnormalities of gait and mobility: Secondary | ICD-10-CM

## 2019-10-28 DIAGNOSIS — R2681 Unsteadiness on feet: Secondary | ICD-10-CM

## 2019-10-28 LAB — BASIC METABOLIC PANEL
BUN/Creatinine Ratio: 20 (ref 9–20)
BUN: 18 mg/dL (ref 6–24)
CO2: 25 mmol/L (ref 20–29)
Calcium: 9.8 mg/dL (ref 8.7–10.2)
Chloride: 106 mmol/L (ref 96–106)
Creatinine, Ser: 0.89 mg/dL (ref 0.76–1.27)
GFR calc Af Amer: 108 mL/min/{1.73_m2} (ref >59)
GFR calc non Af Amer: 94 mL/min/{1.73_m2} (ref >59)
Glucose: 104 mg/dL — ABNORMAL HIGH (ref 65–99)
Potassium: 4 mmol/L (ref 3.5–5.2)
Sodium: 143 mmol/L (ref 134–144)

## 2019-10-28 LAB — HEMOGLOBIN A1C
Est. average glucose Bld gHb Est-mCnc: 108 mg/dL
Hgb A1c MFr Bld: 5.4 % (ref 4.8–5.6)

## 2019-10-28 NOTE — Therapy (Signed)
[facility] [address] [place], [place], [address] Phone: [phone]   Fax:  [phone]  Physical Therapy Treatment  Patient Details  Name: Randy Shaw MRN: [id number] Date of Birth: [birth year] Referring Provider (PT): Margie Ege, NP   Encounter Date: 10/28/2019   PT End of Session - 10/28/19 1147    Visit Number 7    Number of Visits 9    Date for PT Re-Evaluation 11/07/19    Authorization Type Eval 09/12/19, Recert 10/07/19    PT Start Time 1100    PT Stop Time 1145    PT Time Calculation (min) 45 min    Equipment Utilized During Treatment Gait belt    Activity Tolerance Patient tolerated treatment well    Behavior During Therapy [facility] for tasks assessed/performed           Past Medical History:  Diagnosis Date  . Abnormality of gait 04/22/2013  . Asthma   . BPH (benign prostatic hyperplasia)   . Hypertension   . Multiple sclerosis (HCC)   . 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 none      There were no vitals filed for this visit.   Subjective Assessment - 10/28/19 1133    Subjective Pt reports he is doing alright. No new falls    Pertinent History BPH, hx of falls, MS, HTN    How long can you sit comfortably? no limits    How long can you stand comfortably? 2 min    How long can you walk comfortably? 5 steps with holding on to furniture    Patient Stated Goals Work on strengthening    Currently in Pain? No/denies             Treatment manjally stretched L gastroc/soleus, L hip adductor while patient was sitting in his power chair Sit to stand with CGA: 4x min A from lower chair Chair to chair transfer: 1x min A from lower chair Gait training: RW and min A to advance L LE, boot sliders on L shoe, cues to go up on R toes to improve L swing, cues to keep walker closer and taking smaller steps to improve strep through with  gait instead of step to. 1 x 87' and 1x 30'                         PT Short Term Goals - 10/07/19 1302      PT SHORT TERM GOAL #1   Title Patient will be able to ambulate 10 feet with rolling walker to improve ability to go from bed to bathroom    Baseline able to ambulate in parallel bars 60 feet, have not attempted with rollator in PT yet.    Time 4    Period Weeks    Status On-going    Target Date 10/11/19      PT SHORT TERM GOAL #2   Title Pt will be able to spend at least 2 hours a day and 3 days a week with using his rollator within his house to improve household ambulation    Baseline Pt is mainly using his power chair, pt reports of using 30 min with rollator 3 days/week    Time 4    Period Weeks    Status On-going    Target Date 10/11/19  PT Long Term Goals - 10/07/19 1306      PT LONG TERM GOAL #1   Title Patient will be I and compliant with HEP to self manage his flexibility and improve strength    Baseline not doing any exericses, pt reports improved compliance with HEP    Time 8    Period Weeks    Status On-going      PT LONG TERM GOAL #2   Title Pt will be able to use his rollator for at least 3 hours a day for 3-4 days a week to improve mobility with lesser AD.    Baseline Only using power chair    Time 8    Period Weeks    Status New      PT LONG TERM GOAL #3   Title Pt will be able to don his AFO under to conserve energy and to improve safe ambulation.    Baseline currently taking 25 min to don his old AFO (10/02/19)    Time 8    Period Weeks    Status Revised                 Plan - 10/28/19 1146    Clinical Impression Statement Today's session was focused on continuing to advance gait traning and improve flexibility in L LE. Patient demonstrated improved walking endurance compared to last session. Patient needed moderate cueing to perform unilateral heel raise on R LE to improve L swing phase to improve  step length. Patient needs min A to perform sit to stand from standard chair.    Personal Factors and Comorbidities Comorbidity 2;Fitness;Time since onset of injury/illness/exacerbation;Transportation    Comorbidities hx of falls, HTN, MS    Examination-Activity Limitations Bed Mobility;Carry;Locomotion Level;Dressing;Stairs;Stand;Transfers;Squat    Examination-Participation Restrictions Cleaning;Community Activity;Meal Prep;Laundry;Shop    Stability/Clinical Decision Making Unstable/Unpredictable    Rehab Potential Fair    PT Frequency 1x / week   Pt only wants to come once a week   PT Duration 8 weeks    PT Treatment/Interventions ADLs/Self Care Home Management;Gait training;Stair training;Functional mobility training;Therapeutic activities;Therapeutic exercise;Balance training;Wheelchair mobility training;Passive range of motion;Orthotic Fit/Training;Energy conservation;Joint Manipulations;Manual techniques;Electrical Stimulation    PT Next Visit Plan Gait with rollator, mini squats, fwd step ups in parallel bars to promote knee flexion and extension in L LE    PT Home Exercise Plan knee to chest and lower trunk rotations    Consulted and Agree with Plan of Care Patient           Patient will benefit from skilled therapeutic intervention in order to improve the following deficits and impairments:  Abnormal gait, Decreased activity tolerance, Decreased balance, Decreased endurance, Decreased safety awareness, Decreased range of motion, Decreased mobility, Decreased strength, Difficulty walking, Increased edema, Impaired flexibility, Impaired tone, Impaired vision/preception  Visit Diagnosis: Multiple sclerosis (HCC)  Other abnormalities of gait and mobility  Unsteadiness on feet  Muscle weakness (generalized)     Problem List Patient Active Problem List   Diagnosis Date Noted  . 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  . Abnormality of gait 04/22/2013  . HTN  (hypertension) 06/29/2012  . Multiple sclerosis (HCC) 06/29/2012  . Benign prostatic hyperplasia with urinary obstruction 06/29/2012    [name] 10/28/2019, 11:48 AM  [facility] [address] [place], [place], [address] Phone: [phone]   Fax:  [phone]  Name: Randy Shaw MRN: [id number] Date of Birth: February 26, 1961

## 2019-11-04 ENCOUNTER — Other Ambulatory Visit: Payer: Self-pay

## 2019-11-04 ENCOUNTER — Ambulatory Visit: Payer: 59 | Attending: Neurology

## 2019-11-04 DIAGNOSIS — G35 Multiple sclerosis: Secondary | ICD-10-CM | POA: Diagnosis not present

## 2019-11-04 DIAGNOSIS — R2689 Other abnormalities of gait and mobility: Secondary | ICD-10-CM | POA: Diagnosis present

## 2019-11-04 DIAGNOSIS — M6281 Muscle weakness (generalized): Secondary | ICD-10-CM | POA: Diagnosis present

## 2019-11-04 DIAGNOSIS — R2681 Unsteadiness on feet: Secondary | ICD-10-CM | POA: Diagnosis present

## 2019-11-04 NOTE — Therapy (Signed)
[facility] [address] [place], [place], [address] Phone: [phone]   Fax:  [phone]  Physical Therapy Treatment  Patient Details  Name: Randy Shaw MRN: [id number] Date of Birth: [birth year] Referring Provider (PT): Margie Ege, NP   Encounter Date: 11/04/2019   PT End of Session - 11/04/19 1304    Visit Number 8    Number of Visits 9    Date for PT Re-Evaluation 11/07/19    Authorization Type Eval 09/12/19, Recert 10/07/19    PT Start Time 1100    PT Stop Time 1145    PT Time Calculation (min) 45 min    Equipment Utilized During Treatment Gait belt    Activity Tolerance Patient tolerated treatment well    Behavior During Therapy [facility] for tasks assessed/performed           Past Medical History:  Diagnosis Date  . Abnormality of gait 04/22/2013  . Asthma   . BPH (benign prostatic hyperplasia)   . Hypertension   . Multiple sclerosis (HCC)   . 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 none      There were no vitals filed for this visit.   Subjective Assessment - 11/04/19 1259    Subjective Pt reports he is doing alright. No new falls    Pertinent History BPH, hx of falls, MS, HTN    How long can you sit comfortably? no limits    How long can you stand comfortably? 2 min    How long can you walk comfortably? 5 steps with holding on to furniture    Patient Stated Goals Work on strengthening    Currently in Pain? No/denies                  Orthotist, Wayland Denis. From [employer] present during the session for 20 min for consult Gait training: 1 x 50 feet with RW, mod A to advance L leg during L swing phase due to decreased DF and decreased L hipknee flexion Gait training: 1 x 115' with RW with shoe slide on L shoe, and black theraband wrapped around ankle and knee for dorsiflexion assist and to improve L hip and knee flexion: min A  required on occaison to slide L LE forward during swing hpase. Sit to stand: 5x, tactile cues and verbal cues for slow eccentric sitting and leaning trunk forward to prevent uncontrolled descent.                     PT Short Term Goals - 10/07/19 1302      PT SHORT TERM GOAL #1   Title Patient will be able to ambulate 10 feet with rolling walker to improve ability to go from bed to bathroom    Baseline able to ambulate in parallel bars 60 feet, have not attempted with rollator in PT yet.    Time 4    Period Weeks    Status On-going    Target Date 10/11/19      PT SHORT TERM GOAL #2   Title Pt will be able to spend at least 2 hours a day and 3 days a week with using his rollator within his house to improve household ambulation    Baseline Pt is mainly using his power chair, pt reports of using 30 min with rollator 3 days/week    Time 4    Period Weeks  Status On-going    Target Date 10/11/19             PT Long Term Goals - 10/07/19 1306      PT LONG TERM GOAL #1   Title Patient will be I and compliant with HEP to self manage his flexibility and improve strength    Baseline not doing any exericses, pt reports improved compliance with HEP    Time 8    Period Weeks    Status On-going      PT LONG TERM GOAL #2   Title Pt will be able to use his rollator for at least 3 hours a day for 3-4 days a week to improve mobility with lesser AD.    Baseline Only using power chair    Time 8    Period Weeks    Status New      PT LONG TERM GOAL #3   Title Pt will be able to don his AFO under to conserve energy and to improve safe ambulation.    Baseline currently taking 25 min to don his old AFO (10/02/19)    Time 8    Period Weeks    Status Revised                 Plan - 11/04/19 1300    Clinical Impression Statement Today's session we focused on identifying correct orthosis to improve gait quality and energy conservation with the help of othotist  present during the session. Due to excessive plantar flexion contraction due to tone, orthotists though pt may benefit from swedish knee cage to reduce hyperextension. With knee not hyperextending, pt may be able to clear the foot better with just toe slide on shoes. If we decided to do AFO down the road, it will have to be custom made to support plantar flexion contracture. Pt was able to ambulate 115' with RW today with CGA/ min A to slide the left leg forward.    Personal Factors and Comorbidities Comorbidity 2;Fitness;Time since onset of injury/illness/exacerbation;Transportation    Comorbidities hx of falls, HTN, MS    Examination-Activity Limitations Bed Mobility;Carry;Locomotion Level;Dressing;Stairs;Stand;Transfers;Squat    Examination-Participation Restrictions Cleaning;Community Activity;Meal Prep;Laundry;Shop    Stability/Clinical Decision Making Unstable/Unpredictable    Rehab Potential Fair    PT Frequency 1x / week   Pt only wants to come once a week   PT Duration 8 weeks    PT Treatment/Interventions ADLs/Self Care Home Management;Gait training;Stair training;Functional mobility training;Therapeutic activities;Therapeutic exercise;Balance training;Wheelchair mobility training;Passive range of motion;Orthotic Fit/Training;Energy conservation;Joint Manipulations;Manual techniques;Electrical Stimulation    PT Next Visit Plan Gait with rollator, mini squats, fwd step ups in parallel bars to promote knee flexion and extension in L LE    PT Home Exercise Plan knee to chest and lower trunk rotations    Consulted and Agree with Plan of Care Patient           Patient will benefit from skilled therapeutic intervention in order to improve the following deficits and impairments:  Abnormal gait, Decreased activity tolerance, Decreased balance, Decreased endurance, Decreased safety awareness, Decreased range of motion, Decreased mobility, Decreased strength, Difficulty walking, Increased edema,  Impaired flexibility, Impaired tone, Impaired vision/preception  Visit Diagnosis: Multiple sclerosis (HCC)  Other abnormalities of gait and mobility  Unsteadiness on feet  Muscle weakness (generalized)     Problem List Patient Active Problem List   Diagnosis Date Noted  . 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  . Abnormality of gait 04/22/2013  .  HTN (hypertension) 06/29/2012  . Multiple sclerosis (HCC) 06/29/2012  . Benign prostatic hyperplasia with urinary obstruction 06/29/2012    [name], PT 11/04/2019, 1:05 PM  [facility] [address] [place], [place], [address] Phone: [phone]   Fax:  [phone]  Name: KINGDAVID LEINBACH MRN: [id number] Date of Birth: 17-Jun-1960

## 2019-11-11 ENCOUNTER — Ambulatory Visit: Payer: 59

## 2019-11-11 ENCOUNTER — Ambulatory Visit: Payer: 59 | Admitting: Cardiovascular Disease

## 2019-11-11 ENCOUNTER — Other Ambulatory Visit: Payer: Self-pay

## 2019-11-11 DIAGNOSIS — G35 Multiple sclerosis: Secondary | ICD-10-CM

## 2019-11-11 DIAGNOSIS — M6281 Muscle weakness (generalized): Secondary | ICD-10-CM

## 2019-11-11 DIAGNOSIS — R2689 Other abnormalities of gait and mobility: Secondary | ICD-10-CM

## 2019-11-11 DIAGNOSIS — R2681 Unsteadiness on feet: Secondary | ICD-10-CM

## 2019-11-11 NOTE — Therapy (Signed)
[facility] [address] [place], [place], [address] Phone: [phone]   Fax:  [phone]  Physical Therapy Treatment  Patient Details  Name: Randy Shaw MRN: [id number] Date of Birth: [birth year] Referring Provider (PT): Margie Ege, NP   Encounter Date: 11/11/2019   PT End of Session - 11/11/19 1138    Visit Number 9    Number of Visits 13    Date for PT Re-Evaluation 12/02/19    Authorization Type Eval 09/12/19, Recert 10/07/19    PT Start Time 1105    PT Stop Time 1145    PT Time Calculation (min) 40 min    Equipment Utilized During Treatment Gait belt    Activity Tolerance Patient tolerated treatment well;Patient limited by fatigue    Behavior During Therapy [facility] for tasks assessed/performed           Past Medical History:  Diagnosis Date  . Abnormality of gait 04/22/2013  . Asthma   . BPH (benign prostatic hyperplasia)   . Hypertension   . Multiple sclerosis (HCC)   . 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 none      There were no vitals filed for this visit.   Subjective Assessment - 11/11/19 1129    Subjective Pt reports he is doing alright. No new falls    Pertinent History BPH, hx of falls, MS, HTN    How long can you sit comfortably? no limits    How long can you stand comfortably? 2 min    How long can you walk comfortably? 5 steps with holding on to furniture    Patient Stated Goals Work on Museum/gallery exhibitions officer: with swedish knee cage and boot slider on L LE. Wheelchair follow: 89' with RW and CGA with cues to take smaller strides, increasing weight shift to L with L Stance phase  Stairs: mod A with L LE to bring it up and down, bil rails: 4 steps                      PT Short Term Goals - 10/07/19 1302      PT SHORT TERM GOAL #1   Title Patient will be  able to ambulate 10 feet with rolling walker to improve ability to go from bed to bathroom    Baseline able to ambulate in parallel bars 60 feet, have not attempted with rollator in PT yet.    Time 4    Period Weeks    Status On-going    Target Date 10/11/19      PT SHORT TERM GOAL #2   Title Pt will be able to spend at least 2 hours a day and 3 days a week with using his rollator within his house to improve household ambulation    Baseline Pt is mainly using his power chair, pt reports of using 30 min with rollator 3 days/week    Time 4    Period Weeks    Status On-going    Target Date 10/11/19             PT Long Term Goals - 10/07/19 1306      PT LONG TERM GOAL #1   Title Patient will be I and compliant with HEP to self manage his flexibility and improve  strength    Baseline not doing any exericses, pt reports improved compliance with HEP    Time 8    Period Weeks    Status On-going      PT LONG TERM GOAL #2   Title Pt will be able to use his rollator for at least 3 hours a day for 3-4 days a week to improve mobility with lesser AD.    Baseline Only using power chair    Time 8    Period Weeks    Status New      PT LONG TERM GOAL #3   Title Pt will be able to don his AFO under to conserve energy and to improve safe ambulation.    Baseline currently taking 25 min to don his old AFO (10/02/19)    Time 8    Period Weeks    Status Revised                 Plan - 11/11/19 1130    Clinical Impression Statement Today's session was focused on practicing gait with swedish knee brace. We worked on increasing WB through L LE instead of excessively relying on WB through Bil UE with ambulation. Patient needed mod A with stairs to bring L LE on the step or down the step when coming down the steps.    Personal Factors and Comorbidities Comorbidity 2;Fitness;Time since onset of injury/illness/exacerbation;Transportation    Comorbidities hx of falls, HTN, MS     Examination-Activity Limitations Bed Mobility;Carry;Locomotion Level;Dressing;Stairs;Stand;Transfers;Squat    Examination-Participation Restrictions Cleaning;Community Activity;Meal Prep;Laundry;Shop    Stability/Clinical Decision Making Unstable/Unpredictable    Rehab Potential Fair    PT Frequency 1x / week   Pt only wants to come once a week   PT Duration 8 weeks    PT Treatment/Interventions ADLs/Self Care Home Management;Gait training;Stair training;Functional mobility training;Therapeutic activities;Therapeutic exercise;Balance training;Wheelchair mobility training;Passive range of motion;Orthotic Fit/Training;Energy conservation;Joint Manipulations;Manual techniques;Electrical Stimulation    PT Next Visit Plan Gait with rollator, mini squats, fwd step ups in parallel bars to promote knee flexion and extension in L LE    PT Home Exercise Plan knee to chest and lower trunk rotations    Consulted and Agree with Plan of Care Patient           Patient will benefit from skilled therapeutic intervention in order to improve the following deficits and impairments:  Abnormal gait, Decreased activity tolerance, Decreased balance, Decreased endurance, Decreased safety awareness, Decreased range of motion, Decreased mobility, Decreased strength, Difficulty walking, Increased edema, Impaired flexibility, Impaired tone, Impaired vision/preception  Visit Diagnosis: Other abnormalities of gait and mobility  Unsteadiness on feet  Muscle weakness (generalized)  Multiple sclerosis (HCC)     Problem List Patient Active Problem List   Diagnosis Date Noted  . 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  . Abnormality of gait 04/22/2013  . HTN (hypertension) 06/29/2012  . Multiple sclerosis (HCC) 06/29/2012  . Benign prostatic hyperplasia with urinary obstruction 06/29/2012    [name] 11/11/2019, 11:47 AM  [facility] [address] [place], [place], [address] Phone: [phone]   Fax:  [phone]  Name: Randy Shaw MRN: [id number] Date of Birth: 07/09/1960

## 2019-11-17 ENCOUNTER — Other Ambulatory Visit: Payer: Self-pay

## 2019-11-17 ENCOUNTER — Ambulatory Visit (INDEPENDENT_AMBULATORY_CARE_PROVIDER_SITE_OTHER): Payer: 59 | Admitting: Family Medicine

## 2019-11-17 VITALS — BP 136/80 | HR 88 | Temp 97.6°F | Resp 16 | Ht 75.0 in | Wt 210.0 lb

## 2019-11-17 DIAGNOSIS — G8250 Quadriplegia, unspecified: Secondary | ICD-10-CM

## 2019-11-17 DIAGNOSIS — R600 Localized edema: Secondary | ICD-10-CM

## 2019-11-17 DIAGNOSIS — I10 Essential (primary) hypertension: Secondary | ICD-10-CM

## 2019-11-17 DIAGNOSIS — B353 Tinea pedis: Secondary | ICD-10-CM | POA: Diagnosis not present

## 2019-11-17 DIAGNOSIS — G35 Multiple sclerosis: Secondary | ICD-10-CM

## 2019-11-17 MED ORDER — CLOTRIMAZOLE 1 % EX CREA: 1.0000 "application " | TOPICAL_CREAM | Freq: Two times a day (BID) | CUTANEOUS | 0 refills | Status: DC

## 2019-11-17 NOTE — Progress Notes (Signed)
Subjective:  Patient ID: Randy Shaw, male    DOB: 1960-08-08  Age: 59 y.o. MRN: [id number]  CC:  Chief Complaint  Patient presents with  . Hypertension    pt denies physical symptoms has been doing okay BP good today, pt does not take BP at home   . Edema    pt has had bilateral leg swelling notes it is not worse but also doesnt seem much better,no changes at this time   . 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  Treated with Tecfidera, baclofen as needed, AFO brace. Currently undergoing physical therapy to decrease risk of falls.  Improving at last visit.  Decreased amount of falls. No falls since last visit. PT has been difficult.  Leg swelling, hypertension Left greater than right swelling discussed at his June 28 visit.  Thought to have component of excess sodium in the diet with frozen foods, recommended decreased sodium, and stopping alcohol temporarily.  Blood pressure also borderline last visit, continued on same regimen with planned diet changes as above.  He was referred to cardiology for some chest symptoms at that time as well as to discuss swelling.  Has cardiology appointment August 13. He does take hctz 12.5mg  qd.   Has cut back on added salt. Drinking more water, and cutting back on frozen food.  Cut back on alcohol almost entirely.  Home BP with wrist cuff - some high readings, but hard to use.  Usually in motorized wheelchair. Legs usually dependent. No new wounds.   BP Readings from Last 3 Encounters:  11/17/19 136/80  08/06/19 (!) 142/88  02/05/19 118/82   Wt Readings from Last 3 Encounters:  11/17/19 210 lb (95.3 kg)  08/06/19 210 lb (95.3 kg)  02/05/19 210 lb (95.3 kg)    Lab Results  Component Value Date   NA 143 10/27/2019   K 4.0 10/27/2019    CL 106 10/27/2019   CO2 25 10/27/2019    history Patient Active Problem List   Diagnosis Date Noted  . 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  . Abnormality of gait 04/22/2013  . HTN (hypertension) 06/29/2012  . Multiple sclerosis (HCC) 06/29/2012  . Benign prostatic hyperplasia with urinary obstruction 06/29/2012   Past Medical History:  Diagnosis Date  . Abnormality of gait 04/22/2013  . Asthma   . BPH (benign prostatic hyperplasia)   . Hypertension   . Multiple sclerosis (HCC)   . 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   Past Surgical History:  Procedure Laterality Date  . none     Allergies  Allergen Reactions  . Peanut-Containing Drug Products Shortness Of Breath  . Zanaflex [Tizanidine Hcl] Shortness Of Breath   Prior to Admission medications   Medication Sig Start Date End Date Taking? Authorizing Provider  Armodafinil 150 MG tablet Take 1 tablet (150 mg total) by mouth daily. 06/26/19  Yes Glean Salvo, NP  baclofen (LIORESAL) 20 MG tablet TAKE 1 TABLET BY MOUTH IN THE MORNING, 1 TABLET AT NOON, AND 2 TABLETS AT NIGHT 08/06/19  Yes Glean Salvo, NP  hydrochlorothiazide (MICROZIDE) 12.5 MG capsule TAKE 1 CAPSULE(12.5 MG) BY MOUTH DAILY 10/16/19  Yes Shade Flood, MD  MYRBETRIQ 50 MG TB24 tablet  02/16/19  Yes [provider]  tamsulosin (FLOMAX) 0.4 MG CAPS  capsule Take 1 capsule (0.4 mg total) by mouth daily. 07/01/14  Yes Tonye Pearson, MD  TECFIDERA 240 MG CPDR Take 1 capsule by mouth 2 (two) times daily. 07/24/19  Yes [provider]   Social History   Socioeconomic History  . Marital status: Divorced    Spouse name: n/a  . Number of children: 3  . Years of education: 17  . Highest education level: Not on file  Occupational History  . Occupation: disabled/retired 1999-[employer]    Comment: Event organiser  Tobacco Use  . Smoking status: Never Smoker  . Smokeless tobacco: Never Used  Substance and Sexual  Activity  . Alcohol use: Yes    Alcohol/week: 0.0 standard drinks    Comment: occasionally wine cooler  . Drug use: No  . Sexual activity: Never  Other Topics Concern  . Not on file  Social History Narrative   Lives alone, house.  3 children.  Caffeine none.  Green Tea once week.     Social Determinants of Health   Financial Resource Strain:   . Difficulty of Paying Living Expenses:   Food Insecurity:   . Worried About Programme researcher, broadcasting/film/video in the Last Year:   . Barista in the Last Year:   Transportation Needs:   . Freight forwarder (Medical):   Marland Kitchen Lack of Transportation (Non-Medical):   Physical Activity:   . Days of Exercise per Week:   . Minutes of Exercise per Session:   Stress:   . Feeling of Stress :   Social Connections:   . Frequency of Communication with Friends and Family:   . Frequency of Social Gatherings with Friends and Family:   . Attends Religious Services:   . Active Member of Clubs or Organizations:   . Attends Banker Meetings:   Marland Kitchen Marital Status:   Intimate Partner Violence:   . Fear of Current or Ex-Partner:   . Emotionally Abused:   Marland Kitchen Physically Abused:   . Sexually Abused:     Review of Systems  Constitutional: Negative for fatigue and unexpected weight change.  Eyes: Negative for visual disturbance.  Respiratory: Negative for cough, chest tightness and shortness of breath.   Cardiovascular: Positive for leg swelling (feels about the same. ). Negative for chest pain and palpitations.  Gastrointestinal: Negative for abdominal pain and blood in stool.  Neurological: Negative for dizziness, light-headedness and headaches.     Objective:   Vitals:   11/17/19 1103  BP: 136/80  Pulse: 88  Resp: 16  Temp: 97.6 F (36.4 C)  TempSrc: Temporal  SpO2: 97%  Weight: 210 lb (95.3 kg)  Height:  (1.905 m)     Physical Exam Vitals reviewed.  Constitutional:      Appearance: He is well-developed.  HENT:     Head:  Normocephalic and atraumatic.  Eyes:     Pupils: Pupils are equal, round, and reactive to light.  Neck:     Vascular: No carotid bruit or JVD.  Cardiovascular:     Rate and Rhythm: Normal rate and regular rhythm.     Heart sounds: Normal heart sounds. No murmur heard.   Pulmonary:     Effort: Pulmonary effort is normal.     Breath sounds: Normal breath sounds. No rales.  Musculoskeletal:     Right lower leg: Edema present.     Left lower leg: Edema (2+ pitting left greater than R - lower leg on left, ankle to  foot on right ) present.  Skin:    General: Skin is warm and dry.     Comments: Small area of abrasion, wet appearance b/t 2nd and 3rd toe on left. Few other interdigital areas on left with slight wet appearance.   Neurological:     Mental Status: He is alert and oriented to person, place, and time.        Assessment & Plan:  Randy Shaw is a 59 y.o. male . Tinea pedis of left foot - Plan: clotrimazole (LOTRIMIN) 1 % cream  -Foot care discussed including thorough drying after cleansing, clotrimazole in between affected toes, RTC precautions  Bilateral leg edema  -Persistent.  Anticipate he will have some improvement with continued avoidance of sodium, compression stockings prescribed.  Continue HCTZ same dose for now, follow-up with cardiology as planned.  Essential hypertension  -Stable, continue HCTZ same dose  MS (multiple sclerosis) (HCC) Quadriplegia and quadriparesis (HCC)  -Some improved strength, decreased falls with physical therapy.  Tolerating baclofen and Tecfidera, has ongoing follow-up with neurology.  Stable.  Meds ordered this encounter  Medications  . clotrimazole (LOTRIMIN) 1 % cream    Sig: Apply 1 application topically 2 (two) times daily. To any wet areas between toes for 1 week.    Dispense:  30 g    Refill:  0   Patient Instructions    Try light compression stockings to help with the swelling.  Keep up the good work with cutting back  on sodium as I do think that will help.  Okay to remain on same dose of blood pressure medicine for now, follow-up with cardiology in a few weeks, and if swelling has not improved can potentially make some other medication changes at that time.  Let me know if any new symptoms prior to that visit.  Small area of discharge between your second and third toes may be a little bit of a athlete's foot, or possible abrasion.  There are a few wet areas between the toes.  Make sure to clean between the toes with soap and water and allow that area to dry thoroughly before putting on your socks.  Can apply the antifungal cream clotrimazole twice per day for a week or so if any irritated areas between the toes.  Let me know if there are questions and thanks for coming in today.    Athlete's Foot  Athlete's foot (tinea pedis) is a fungal infection of the skin on your feet. It often occurs on the skin that is between or underneath the toes. It can also occur on the soles of your feet. The infection can spread from person to person (is contagious). It can also spread when a person's bare feet come in contact with the fungus on shower floors or on items such as shoes. What are the causes? This condition is caused by a fungus that grows in warm, moist places. You can get athlete's foot by sharing shoes, shower stalls, towels, and wet floors with someone who is infected. Not washing your feet or changing your socks often enough can also lead to athlete's foot. What increases the risk? This condition is more likely to develop in:  Men.  People who have a weak body defense system (immune system).  People who have diabetes.  People who use public showers, such as at a gym.  People who wear heavy-duty shoes, such as Youth worker.  Seasons with warm, humid weather. What are the signs or  symptoms? Symptoms of this condition include:  Itchy areas between your toes or on the soles of your  feet.  White, flaky, or scaly areas between your toes or on the soles of your feet.  Very itchy small blisters between your toes or on the soles of your feet.  Small cuts in your skin. These cuts can become infected.  Thick or discolored toenails. How is this diagnosed? This condition may be diagnosed with a physical exam and a review of your medical history. Your health care provider may also take a skin or toenail sample to examine under a microscope. How is this treated? This condition is treated with antifungal medicines. These may be applied as powders, ointments, or creams. In severe cases, an oral antifungal medicine may be given. Follow these instructions at home: Medicines  Apply or take over-the-counter and prescription medicines only as told by your health care provider.  Apply your antifungal medicine as told by your health care provider. Do not stop using the antifungal even if your condition improves. Foot care  Do not scratch your feet.  Keep your feet dry: ? Wear cotton or wool socks. Change your socks every day or if they become wet. ? Wear shoes that allow air to flow, such as sandals or canvas tennis shoes.  Wash and dry your feet, including the area between your toes. Also, wash and dry your feet: ? Every day or as told by your health care provider. ? After exercising. General instructions  Do not let others use towels, shoes, nail clippers, or other personal items that touch your feet.  Protect your feet by wearing sandals in wet areas, such as locker rooms and shared showers.  Keep all follow-up visits as told by your health care provider. This is important.  If you have diabetes, keep your blood sugar under control. Contact a health care provider if:  You have a fever.  You have swelling, soreness, warmth, or redness in your foot.  Your feet are not getting better with treatment.  Your symptoms get worse.  You have new  symptoms. Summary  Athlete's foot (tinea pedis) is a fungal infection of the skin on your feet. It often occurs on skin that is between or underneath the toes.  This condition is caused by a fungus that grows in warm, moist places.  Symptoms include white, flaky, or scaly areas between your toes or on the soles of your feet.  This condition is treated with antifungal medicines.  Keep your feet clean. Always dry them thoroughly. This information is not intended to replace advice given to you by your health care provider. Make sure you discuss any questions you have with your health care provider. Document Revised: 04/12/2017 Document Reviewed: 02/05/2017 Elsevier Patient Education  [employer].   If you have lab work done today you will be contacted with your lab results within the next 2 weeks.  If you have not heard from [place] then please contact us. The fastest way to get your results is to register for My Chart.   IF you received an x-ray today, you will receive an invoice from [facility]. Please contact [facility] at [phone] with questions or concerns regarding your invoice.   IF you received labwork today, you will receive an invoice from [place]. Please contact [facility] at [phone] with questions or concerns regarding your invoice.   Our billing staff will not be able to assist you with questions regarding bills from these companies.  You will be contacted with the lab results as soon as they are available. The fastest way to get your results is to activate your My Chart account. Instructions are located on the last page of this paperwork. If you have not heard from [place] regarding the results in 2 weeks, please contact this office.         Signed, Merri Ray, MD [facility]

## 2019-11-17 NOTE — Patient Instructions (Addendum)
Try light compression stockings to help with the swelling.  Keep up the good work with cutting back on sodium as I do think that will help.  Okay to remain on same dose of blood pressure medicine for now, follow-up with cardiology in a few weeks, and if swelling has not improved can potentially make some other medication changes at that time.  Let me know if any new symptoms prior to that visit.  Small area of discharge between your second and third toes may be a little bit of a athlete's foot, or possible abrasion.  There are a few wet areas between the toes.  Make sure to clean between the toes with soap and water and allow that area to dry thoroughly before putting on your socks.  Can apply the antifungal cream clotrimazole twice per day for a week or so if any irritated areas between the toes.  Let me know if there are questions and thanks for coming in today.    Athlete's Foot  Athlete's foot (tinea pedis) is a fungal infection of the skin on your feet. It often occurs on the skin that is between or underneath the toes. It can also occur on the soles of your feet. The infection can spread from person to person (is contagious). It can also spread when a person's bare feet come in contact with the fungus on shower floors or on items such as shoes. What are the causes? This condition is caused by a fungus that grows in warm, moist places. You can get athlete's foot by sharing shoes, shower stalls, towels, and wet floors with someone who is infected. Not washing your feet or changing your socks often enough can also lead to athlete's foot. What increases the risk? This condition is more likely to develop in:  Men.  People who have a weak body defense system (immune system).  People who have diabetes.  People who use public showers, such as at a gym.  People who wear heavy-duty shoes, such as Youth worker.  Seasons with warm, humid weather. What are the signs or  symptoms? Symptoms of this condition include:  Itchy areas between your toes or on the soles of your feet.  White, flaky, or scaly areas between your toes or on the soles of your feet.  Very itchy small blisters between your toes or on the soles of your feet.  Small cuts in your skin. These cuts can become infected.  Thick or discolored toenails. How is this diagnosed? This condition may be diagnosed with a physical exam and a review of your medical history. Your health care provider may also take a skin or toenail sample to examine under a microscope. How is this treated? This condition is treated with antifungal medicines. These may be applied as powders, ointments, or creams. In severe cases, an oral antifungal medicine may be given. Follow these instructions at home: Medicines  Apply or take over-the-counter and prescription medicines only as told by your health care provider.  Apply your antifungal medicine as told by your health care provider. Do not stop using the antifungal even if your condition improves. Foot care  Do not scratch your feet.  Keep your feet dry: ? Wear cotton or wool socks. Change your socks every day or if they become wet. ? Wear shoes that allow air to flow, such as sandals or canvas tennis shoes.  Wash and dry your feet, including the area between your toes. Also, wash and dry  your feet: ? Every day or as told by your health care provider. ? After exercising. General instructions  Do not let others use towels, shoes, nail clippers, or other personal items that touch your feet.  Protect your feet by wearing sandals in wet areas, such as locker rooms and shared showers.  Keep all follow-up visits as told by your health care provider. This is important.  If you have diabetes, keep your blood sugar under control. Contact a health care provider if:  You have a fever.  You have swelling, soreness, warmth, or redness in your foot.  Your feet are  not getting better with treatment.  Your symptoms get worse.  You have new symptoms. Summary  Athlete's foot (tinea pedis) is a fungal infection of the skin on your feet. It often occurs on skin that is between or underneath the toes.  This condition is caused by a fungus that grows in warm, moist places.  Symptoms include white, flaky, or scaly areas between your toes or on the soles of your feet.  This condition is treated with antifungal medicines.  Keep your feet clean. Always dry them thoroughly. This information is not intended to replace advice given to you by your health care provider. Make sure you discuss any questions you have with your health care provider. Document Revised: 04/12/2017 Document Reviewed: 02/05/2017 Elsevier Patient Education  [employer].   If you have lab work done today you will be contacted with your lab results within the next 2 weeks.  If you have not heard from [place] then please contact us. The fastest way to get your results is to register for My Chart.   IF you received an x-ray today, you will receive an invoice from [facility]. Please contact [facility] at [phone] with questions or concerns regarding your invoice.   IF you received labwork today, you will receive an invoice from [place]. Please contact [facility] at [phone] with questions or concerns regarding your invoice.   Our billing staff will not be able to assist you with questions regarding bills from these companies.  You will be contacted with the lab results as soon as they are available. The fastest way to get your results is to activate your My Chart account. Instructions are located on the last page of this paperwork. If you have not heard from [place] regarding the results in 2 weeks, please contact this office.

## 2019-11-18 ENCOUNTER — Encounter: Payer: Self-pay | Admitting: Family Medicine

## 2019-11-28 ENCOUNTER — Ambulatory Visit: Payer: 59

## 2019-11-28 ENCOUNTER — Other Ambulatory Visit: Payer: Self-pay

## 2019-11-28 DIAGNOSIS — M6281 Muscle weakness (generalized): Secondary | ICD-10-CM

## 2019-11-28 DIAGNOSIS — R2689 Other abnormalities of gait and mobility: Secondary | ICD-10-CM

## 2019-11-28 DIAGNOSIS — R2681 Unsteadiness on feet: Secondary | ICD-10-CM

## 2019-11-28 DIAGNOSIS — G35 Multiple sclerosis: Secondary | ICD-10-CM | POA: Diagnosis not present

## 2019-11-28 NOTE — Therapy (Signed)
[facility] [address] [place], [place], [address] Phone: [phone]   Fax:  [phone]  Physical Therapy Treatment  Patient Details  Name: Randy Shaw MRN: [id number] Date of Birth: February 29, 1961 Referring Provider (PT): Margie Ege, NP   Encounter Date: 11/28/2019   PT End of Session - 11/28/19 1129    Visit Number 10    Number of Visits 13    Date for PT Re-Evaluation 12/02/19    Authorization Type Eval 09/12/19, Recert 10/07/19    PT Start Time 1100    PT Stop Time 1145    PT Time Calculation (min) 45 min    Equipment Utilized During Treatment Gait belt    Activity Tolerance Patient tolerated treatment well;Patient limited by fatigue    Behavior During Therapy [facility] for tasks assessed/performed           Past Medical History:  Diagnosis Date  . Abnormality of gait 04/22/2013  . Asthma   . BPH (benign prostatic hyperplasia)   . Hypertension   . Multiple sclerosis (HCC)   . 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 none      There were no vitals filed for this visit.   Subjective Assessment - 11/28/19 1126    Subjective Pt reports he is doing alright. No new falls    Pertinent History BPH, hx of falls, MS, HTN    How long can you sit comfortably? no limits    How long can you stand comfortably? 2 min    How long can you walk comfortably? 5 steps with holding on to furniture    Patient Stated Goals Work on strengthening                Treatment:  Swedish knee cage applied to patient and bil foot up attachment put in to the shoe for gait  Gait training: RW, min A intermittently to slide L foot forward. 2 x 55 feet                       PT Short Term Goals - 10/07/19 1302      PT SHORT TERM GOAL #1   Title Patient will be able to ambulate 10 feet with rolling walker to improve ability to go from bed to  bathroom    Baseline able to ambulate in parallel bars 60 feet, have not attempted with rollator in PT yet.    Time 4    Period Weeks    Status On-going    Target Date 10/11/19      PT SHORT TERM GOAL #2   Title Pt will be able to spend at least 2 hours a day and 3 days a week with using his rollator within his house to improve household ambulation    Baseline Pt is mainly using his power chair, pt reports of using 30 min with rollator 3 days/week    Time 4    Period Weeks    Status On-going    Target Date 10/11/19             PT Long Term Goals - 10/07/19 1306      PT LONG TERM GOAL #1   Title Patient will be I and compliant with HEP to self manage his flexibility and improve strength    Baseline not doing any exericses, pt reports improved compliance with HEP  Time 8    Period Weeks    Status On-going      PT LONG TERM GOAL #2   Title Pt will be able to use his rollator for at least 3 hours a day for 3-4 days a week to improve mobility with lesser AD.    Baseline Only using power chair    Time 8    Period Weeks    Status New      PT LONG TERM GOAL #3   Title Pt will be able to don his AFO under to conserve energy and to improve safe ambulation.    Baseline currently taking 25 min to don his old AFO (10/02/19)    Time 8    Period Weeks    Status Revised                 Plan - 11/28/19 1128    Clinical Impression Statement Pt demo improved knee hyperextension with swedish knee cage. Pt was able to pick up left foot better with "foot up" attachment to his shoe.with fatigue he still had trouble slideing the foot forward. Patient still has to perform ankle plantarflexion on R LE during L swing phase to clear L foot.    Personal Factors and Comorbidities Comorbidity 2;Fitness;Time since onset of injury/illness/exacerbation;Transportation    Comorbidities hx of falls, HTN, MS    Examination-Activity Limitations Bed Mobility;Carry;Locomotion  Level;Dressing;Stairs;Stand;Transfers;Squat    Examination-Participation Restrictions Cleaning;Community Activity;Meal Prep;Laundry;Shop    Stability/Clinical Decision Making Unstable/Unpredictable    Rehab Potential Fair    PT Frequency 1x / week   Pt only wants to come once a week   PT Duration 8 weeks    PT Treatment/Interventions ADLs/Self Care Home Management;Gait training;Stair training;Functional mobility training;Therapeutic activities;Therapeutic exercise;Balance training;Wheelchair mobility training;Passive range of motion;Orthotic Fit/Training;Energy conservation;Joint Manipulations;Manual techniques;Electrical Stimulation    PT Next Visit Plan Gait with rollator, mini squats, fwd step ups in parallel bars to promote knee flexion and extension in L LE    PT Home Exercise Plan knee to chest and lower trunk rotations    Consulted and Agree with Plan of Care Patient           Patient will benefit from skilled therapeutic intervention in order to improve the following deficits and impairments:  Abnormal gait, Decreased activity tolerance, Decreased balance, Decreased endurance, Decreased safety awareness, Decreased range of motion, Decreased mobility, Decreased strength, Difficulty walking, Increased edema, Impaired flexibility, Impaired tone, Impaired vision/preception  Visit Diagnosis: Other abnormalities of gait and mobility  Unsteadiness on feet  Muscle weakness (generalized)  Multiple sclerosis (HCC)     Problem List Patient Active Problem List   Diagnosis Date Noted  . 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  . Abnormality of gait 04/22/2013  . HTN (hypertension) 06/29/2012  . Multiple sclerosis (HCC) 06/29/2012  . Benign prostatic hyperplasia with urinary obstruction 06/29/2012    [name], PT 11/28/2019, 11:31 AM  [facility] [address] [place], [place], [address] Phone:  [phone]   Fax:  [phone]  Name: SEITH AIKEY MRN: [id number] Date of Birth: 1960/11/02

## 2019-12-01 ENCOUNTER — Ambulatory Visit: Payer: 59 | Attending: Neurology

## 2019-12-01 ENCOUNTER — Other Ambulatory Visit: Payer: Self-pay

## 2019-12-01 DIAGNOSIS — R2681 Unsteadiness on feet: Secondary | ICD-10-CM | POA: Insufficient documentation

## 2019-12-01 DIAGNOSIS — G35 Multiple sclerosis: Secondary | ICD-10-CM | POA: Diagnosis present

## 2019-12-01 DIAGNOSIS — R2689 Other abnormalities of gait and mobility: Secondary | ICD-10-CM | POA: Insufficient documentation

## 2019-12-01 DIAGNOSIS — M6281 Muscle weakness (generalized): Secondary | ICD-10-CM | POA: Diagnosis present

## 2019-12-01 NOTE — Therapy (Signed)
[facility] [address] [place], [place], [address] Phone: [phone]   Fax:  [phone]  Physical Therapy Treatment  Patient Details  Name: JAMIRE SHABAZZ MRN: [id number] Date of Birth: 02-03-1961 Referring Provider (PT): Margie Ege, NP   Encounter Date: 12/01/2019   PT End of Session - 12/01/19 1536    Visit Number 11    Number of Visits 13    Date for PT Re-Evaluation 12/02/19    Authorization Type Eval 09/12/19, Recert 10/07/19    PT Start Time 1450    PT Stop Time 1530    PT Time Calculation (min) 40 min    Equipment Utilized During Treatment Gait belt    Activity Tolerance Patient tolerated treatment well;Patient limited by fatigue    Behavior During Therapy [facility] for tasks assessed/performed           Past Medical History:  Diagnosis Date  . Abnormality of gait 04/22/2013  . Asthma   . BPH (benign prostatic hyperplasia)   . Hypertension   . Multiple sclerosis (HCC)   . 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 none      There were no vitals filed for this visit.   Subjective Assessment - 12/01/19 1530    Subjective I am tired today.    Pertinent History BPH, hx of falls, MS, HTN    How long can you sit comfortably? no limits    How long can you stand comfortably? 2 min    How long can you walk comfortably? 5 steps with holding on to furniture    Patient Stated Goals Work on strengthening                      Treatment:  Swedish knee cage applied to patient and bil foot up attachment put in to the shoe for gait, L shoe slider put on.  Gait training: RW, min A intermittently to slide L foot forward. 1 x 40 feet with RW;  4 x 10 feet in parallel bars  Sit to stand transfers from standard chair with airex foam: 5x during the session, mod A                         PT Short Term Goals - 10/07/19  1302      PT SHORT TERM GOAL #1   Title Patient will be able to ambulate 10 feet with rolling walker to improve ability to go from bed to bathroom    Baseline able to ambulate in parallel bars 60 feet, have not attempted with rollator in PT yet.    Time 4    Period Weeks    Status On-going    Target Date 10/11/19      PT SHORT TERM GOAL #2   Title Pt will be able to spend at least 2 hours a day and 3 days a week with using his rollator within his house to improve household ambulation    Baseline Pt is mainly using his power chair, pt reports of using 30 min with rollator 3 days/week    Time 4    Period Weeks    Status On-going    Target Date 10/11/19             PT Long Term Goals - 10/07/19 1306      PT LONG TERM GOAL #1  Title Patient will be I and compliant with HEP to self manage his flexibility and improve strength    Baseline not doing any exericses, pt reports improved compliance with HEP    Time 8    Period Weeks    Status On-going      PT LONG TERM GOAL #2   Title Pt will be able to use his rollator for at least 3 hours a day for 3-4 days a week to improve mobility with lesser AD.    Baseline Only using power chair    Time 8    Period Weeks    Status New      PT LONG TERM GOAL #3   Title Pt will be able to don his AFO under to conserve energy and to improve safe ambulation.    Baseline currently taking 25 min to don his old AFO (10/02/19)    Time 8    Period Weeks    Status Revised                 Plan - 12/01/19 1531    Clinical Impression Statement Pt tends to have increased tone in L hip adductors which causes him to do scissoring during gait. pt needs tactile cueing ti hike the hip to activate glue med and improve foot clearance during swing phase. Pt needed moderate A to stand up from standard wheelchair with airex foam in there. Pt needed cues to laterally shift weight to L during L stance phase but patient used UE primarily to stabilize  weightbearing.    Personal Factors and Comorbidities Comorbidity 2;Fitness;Time since onset of injury/illness/exacerbation;Transportation    Comorbidities hx of falls, HTN, MS    Examination-Activity Limitations Bed Mobility;Carry;Locomotion Level;Dressing;Stairs;Stand;Transfers;Squat    Stability/Clinical Decision Making Unstable/Unpredictable    Clinical Decision Making High    Rehab Potential Fair    PT Frequency 1x / week    PT Duration 8 weeks    PT Treatment/Interventions ADLs/Self Care Home Management;Gait training;Stair training;Functional mobility training;Therapeutic activities;Therapeutic exercise;Balance training;Wheelchair mobility training;Passive range of motion;Orthotic Fit/Training;Energy conservation;Joint Manipulations;Manual techniques;Electrical Stimulation    PT Next Visit Plan Reassessment/Re-cert    Consulted and Agree with Plan of Care Patient           Patient will benefit from skilled therapeutic intervention in order to improve the following deficits and impairments:  Abnormal gait, Decreased activity tolerance, Decreased balance, Decreased endurance, Decreased safety awareness, Decreased range of motion, Decreased mobility, Decreased strength, Difficulty walking, Increased edema, Impaired flexibility, Impaired tone, Impaired vision/preception  Visit Diagnosis: Other abnormalities of gait and mobility  Unsteadiness on feet  Muscle weakness (generalized)  Multiple sclerosis (HCC)     Problem List Patient Active Problem List   Diagnosis Date Noted  . 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  . Abnormality of gait 04/22/2013  . HTN (hypertension) 06/29/2012  . Multiple sclerosis (HCC) 06/29/2012  . Benign prostatic hyperplasia with urinary obstruction 06/29/2012    [name] 12/01/2019, 3:39 PM  [facility] [address] [place], [place], [address] Phone: [phone]   Fax:   [phone]  Name: EMERY DUPUY MRN: [id number] Date of Birth: April 27, 1961

## 2019-12-10 ENCOUNTER — Ambulatory Visit: Payer: 59

## 2019-12-10 ENCOUNTER — Other Ambulatory Visit: Payer: Self-pay | Admitting: *Deleted

## 2019-12-10 ENCOUNTER — Other Ambulatory Visit: Payer: Self-pay

## 2019-12-10 DIAGNOSIS — R2689 Other abnormalities of gait and mobility: Secondary | ICD-10-CM | POA: Diagnosis not present

## 2019-12-10 DIAGNOSIS — G35 Multiple sclerosis: Secondary | ICD-10-CM

## 2019-12-10 DIAGNOSIS — R2681 Unsteadiness on feet: Secondary | ICD-10-CM

## 2019-12-10 DIAGNOSIS — R269 Unspecified abnormalities of gait and mobility: Secondary | ICD-10-CM

## 2019-12-10 DIAGNOSIS — M6281 Muscle weakness (generalized): Secondary | ICD-10-CM

## 2019-12-10 NOTE — Progress Notes (Signed)
Order placed to [facility] cage.

## 2019-12-10 NOTE — Therapy (Signed)
[facility] [address] [place], [place], [address] Phone: [phone]   Fax:  [phone]  Physical Therapy Treatment  Patient Details  Name: Randy Shaw MRN: [id number] Date of Birth: 01-28-1961 Referring Provider (PT): Margie Ege, NP   Encounter Date: 12/10/2019   PT End of Session - 12/10/19 1514    Visit Number 12    Number of Visits 13    Date for PT Re-Evaluation 12/02/19    Authorization Type Eval 09/12/19, Recert 10/07/19    PT Start Time 1400    PT Stop Time 1445    PT Time Calculation (min) 45 min    Equipment Utilized During Treatment Gait belt    Activity Tolerance Patient tolerated treatment well;Patient limited by fatigue    Behavior During Therapy [facility] for tasks assessed/performed           Past Medical History:  Diagnosis Date  . Abnormality of gait 04/22/2013  . Asthma   . BPH (benign prostatic hyperplasia)   . Hypertension   . Multiple sclerosis (HCC)   . 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 none      There were no vitals filed for this visit.   Subjective Assessment - 12/10/19 1456    Subjective I fell last Friday. Pt doesn't remember what he was doing but he fell in the bathroom and he was able to crawl to his chair and get back up on his chair. He had scrap in his R knee from the fall.    Pertinent History BPH, hx of falls, MS, HTN    How long can you sit comfortably? no limits    How long can you stand comfortably? 2 min    How long can you walk comfortably? 5 steps with holding on to furniture    Patient Stated Goals Work on strengthening              Had patient put on swedish knee cage independently, it took patient about 10 min to put on the swedish knee cage.  Worked on chair to standing with both hands on mat table and then tall kneeling to q-ped on floor with min A for loosing balnace once Then  from q-ped to tall kneeling. Pt unable to get to half kneeling with R LE forward (even with UE use), pt able to push off mat table on his elbows and twist body to get his buttock on the mat table with min A  Supine to prone to q-ped. Pt unable to get to q-ped without mod A from PT due to increased tone in L LE  Prone quad stretching on L LE  AA hamstring curs with use of R LE- pt unable to due to strong tone in L LE  Passive knee flexion in L LE: 20x AA knee flexion in R LE: 20x Supine bridge: 15x excessive use of bil UE Supine hooklying unilateral hip adduction and abduction in L hip: 20x  Pt went from supine to sitting: SBA Sitting to power chair with mod A due to low mat table.                          PT Short Term Goals - 10/07/19 1302      PT SHORT TERM GOAL #1   Title Patient will be able to ambulate 10 feet with rolling walker to improve  ability to go from bed to bathroom    Baseline able to ambulate in parallel bars 60 feet, have not attempted with rollator in PT yet.    Time 4    Period Weeks    Status On-going    Target Date 10/11/19      PT SHORT TERM GOAL #2   Title Pt will be able to spend at least 2 hours a day and 3 days a week with using his rollator within his house to improve household ambulation    Baseline Pt is mainly using his power chair, pt reports of using 30 min with rollator 3 days/week    Time 4    Period Weeks    Status On-going    Target Date 10/11/19             PT Long Term Goals - 10/07/19 1306      PT LONG TERM GOAL #1   Title Patient will be I and compliant with HEP to self manage his flexibility and improve strength    Baseline not doing any exericses, pt reports improved compliance with HEP    Time 8    Period Weeks    Status On-going      PT LONG TERM GOAL #2   Title Pt will be able to use his rollator for at least 3 hours a day for 3-4 days a week to improve mobility with lesser AD.    Baseline Only using  power chair    Time 8    Period Weeks    Status New      PT LONG TERM GOAL #3   Title Pt will be able to don his AFO under to conserve energy and to improve safe ambulation.    Baseline currently taking 25 min to don his old AFO (10/02/19)    Time 8    Period Weeks    Status Revised                 Plan - 12/10/19 1510    Clinical Impression Statement Today' session was focused on working on floor to stand transfers. Initially we started with patient going from standing to transitioning on all fours. Patient then was able to get to tall kneeling position but he was not able to get to half kneeling position by pulling R LE forward with UE use. Patient was able to push of on his elbow from mat table in front of him and while being on his knees and trning his body to get his buttocks on the mat table. We then tried supine to prone and prone to Q-ped transfer on mat table. Patient is able to go from supine to prone independently but had difficult time getting from prone to q-ped due to increased tone in L quads.    Personal Factors and Comorbidities Comorbidity 2;Fitness;Time since onset of injury/illness/exacerbation;Transportation    Comorbidities hx of falls, HTN, MS    Examination-Activity Limitations Bed Mobility;Carry;Locomotion Level;Dressing;Stairs;Stand;Transfers;Squat    Examination-Participation Restrictions Cleaning;Community Activity;Meal Prep;Laundry;Shop    Stability/Clinical Decision Making Unstable/Unpredictable    Rehab Potential Fair    PT Frequency 1x / week    PT Duration 8 weeks    PT Treatment/Interventions ADLs/Self Care Home Management;Gait training;Stair training;Functional mobility training;Therapeutic activities;Therapeutic exercise;Balance training;Wheelchair mobility training;Passive range of motion;Orthotic Fit/Training;Energy conservation;Joint Manipulations;Manual techniques;Electrical Stimulation    PT Next Visit Plan Reassessment/Re-cert    PT Home  Exercise Plan knee to chest and lower trunk rotations  Patient will benefit from skilled therapeutic intervention in order to improve the following deficits and impairments:  Abnormal gait, Decreased activity tolerance, Decreased balance, Decreased endurance, Decreased safety awareness, Decreased range of motion, Decreased mobility, Decreased strength, Difficulty walking, Increased edema, Impaired flexibility, Impaired tone, Impaired vision/preception  Visit Diagnosis: Unsteadiness on feet  Other abnormalities of gait and mobility  Muscle weakness (generalized)  Multiple sclerosis (HCC)     Problem List Patient Active Problem List   Diagnosis Date Noted  . 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  . Abnormality of gait 04/22/2013  . HTN (hypertension) 06/29/2012  . Multiple sclerosis (HCC) 06/29/2012  . Benign prostatic hyperplasia with urinary obstruction 06/29/2012    [name], PT 12/10/2019, 3:14 PM  [facility] [address] [place], [place], [address] Phone: [phone]   Fax:  [phone]  Name: Randy Shaw MRN: [id number] Date of Birth: [birth year]

## 2019-12-11 ENCOUNTER — Telehealth: Payer: Self-pay | Admitting: *Deleted

## 2019-12-11 NOTE — Telephone Encounter (Signed)
Mailed order for DME [employer] Cage for pt.

## 2019-12-12 ENCOUNTER — Ambulatory Visit: Payer: 59 | Admitting: Cardiology

## 2019-12-15 ENCOUNTER — Other Ambulatory Visit: Payer: Self-pay

## 2019-12-15 ENCOUNTER — Ambulatory Visit: Payer: 59

## 2019-12-15 DIAGNOSIS — R2681 Unsteadiness on feet: Secondary | ICD-10-CM

## 2019-12-15 DIAGNOSIS — M6281 Muscle weakness (generalized): Secondary | ICD-10-CM

## 2019-12-15 DIAGNOSIS — G35 Multiple sclerosis: Secondary | ICD-10-CM

## 2019-12-15 DIAGNOSIS — R2689 Other abnormalities of gait and mobility: Secondary | ICD-10-CM

## 2019-12-15 NOTE — Therapy (Signed)
[facility] [address] [place], [place], [address] Phone: [phone]   Fax:  [phone]  Physical Therapy therapy re-certification  Patient Details  Name: Randy Shaw MRN: [id number] Date of Birth: 10-20-1960 Referring Provider (PT): Butler Denmark, NP   Encounter Date: 12/15/2019   PT End of Session - 12/15/19 1500    Visit Number 13    Number of Visits 25    Date for PT Re-Evaluation 03/08/20    Authorization Type Eval 6/38/45, Recert 07/04/44, Recert 12/02/19 (1x YY/48 sessions)    PT Start Time 1315    PT Stop Time 1400    PT Time Calculation (min) 45 min    Equipment Utilized During Treatment Gait belt    Activity Tolerance Patient tolerated treatment well;Patient limited by fatigue    Behavior During Therapy [facility] for tasks assessed/performed           Past Medical History:  Diagnosis Date  . Abnormality of gait 04/22/2013  . Asthma   . BPH (benign prostatic hyperplasia)   . Hypertension   . Multiple sclerosis ([place])   . Quadriplegia and quadriparesis ([place]) 03/08/2016    Past Surgical History:  Procedure Laterality Date  . none      There were no vitals filed for this visit.   Subjective Assessment - 12/15/19 1454    Subjective Pt reports he had 2 more falls since last session while transferring from power chair to his bed. he spend 2-3 hours trying to get up but he couldn't and he ended up calling his brother to pick him of the floor and second time he had to call firefighters to help him pick off the floor.    Pertinent History BPH, hx of falls, MS, HTN    How long can you sit comfortably? no limits    How long can you stand comfortably? 2 min    How long can you walk comfortably? 5 steps with holding on to furniture    Patient Stated Goals Work on Teacher, English as a foreign language to [employer] table: CGA Mat table to power chair: CGA Sitting to supine: CGA Supine heel slides: 20x actively on R  LE, 20x AA/passive on L LE Supine marching: 20x Active on R LE, AA on L LE Bridge: 20x Supine hooklying manually resisted hip abduction/adduction: L Only: 20x Prone quad stretch: manual: 5 x 20" R and L Prone hamstring curls: 20x R active; 20x L Passive Seated slow reversal of knee extension and knee flexion: 20x R Active, 20x L AA-trace activation of hamstring noted here  Sit to bil UE supported standing on mat table with mod A to half kneeling with mod A. From half kneeling to sitting with bil elbows on mat table: max A, pt pushes off elbows and roll over on his back, unable to push off with his legs.    [facility] PT Assessment - 12/15/19 0001      Strength   Left Hip Extension 1/5    Left Knee Flexion 1/5                Access Code: [email or web address] URL: [email or web address] Date: 12/15/2019 Prepared by: Markus Jarvis  Exercises Supine Gluteal Sets - 1 x daily - 7 x weekly - 2 sets - 10 reps Supine Bridge - 1 x daily - 7 x weekly - 2 sets - 10 reps  PT Short Term Goals - 12/15/19 1501      PT SHORT TERM GOAL #1   Title Patient will be able to ambulate 10 feet with rolling walker to improve ability to go from bed to bathroom    Baseline able to ambulate in parallel bars 60 feet, have not attempted with rollator in PT yet.    Time 4    Period Weeks    Status Achieved    Target Date 10/11/19      PT SHORT TERM GOAL #2   Title Pt will be able to spend at least 2 hours a day and 3 days a week with using his rollator within his house to improve household ambulation    Baseline Pt is mainly using his power chair, pt reports of using 30 min with rollator 3 days/week    Time 4    Period Weeks    Status Not Met    Target Date 10/11/19             PT Long Term Goals - 12/15/19 1501      PT LONG TERM GOAL #1   Title Patient will be I and compliant with HEP to self manage his flexibility and improve strength    Baseline not  doing any exericses, pt reports improved compliance with HEP    Time 8    Period Weeks    Status On-going    Target Date 01/26/20      PT LONG TERM GOAL #2   Title Pt will be able to use his rollator for at least 3 hours a day for 3-4 days a week to improve mobility with lesser AD.    Baseline Only using power chair    Time 8    Period Weeks    Status Not Met      PT LONG TERM GOAL #3   Title Pt will be able to don his AFO under 74mn to conserve energy and to improve safe ambulation.    Baseline currently taking 25 min to don his old AFO (10/02/19); pt is able to use swedish knee cage and able to don it in 5 min    Time 8    Period Weeks    Status Achieved      PT LONG TERM GOAL #4   Title Pt will be able to perform floor to sit transfer with CGA to be able to get up by himself at home.    Baseline max A    Time 6    Period Weeks    Status New    Target Date 01/26/20                 Plan - 12/15/19 1457    Clinical Impression Statement Patient has been seen for total of 13 sessions from 09/12/19 to 12/15/19. Pt is now able to don/doff swedish knee cage in under 5 min to help improve his hyperknee extension in L LE during gait. Patient now has fallen 3x in last 1.5 weeks. Patient has hard time getting up from the floor when fallen. Today's skilled session was focused on improving strength of L hamstrings and gluts and also R LE. Patient is not able to actively flex the l knee due to absent hamstring contraction in prone and supine and needs passive support to flex the knee. Patient was able to engage L hamstrings in gravity assisted position for only small part of the range in sitting position. Patient needed  max A to get up from the floor from split lunge position with R LE forward and L knee down on floor. Pt uses bil UE and not able to push through LE with floor to stand transfers. Patient was able to push up on elbow and roll over to his back with floor to sit on mat table  trasfner. Patient will benefit from skilled PT to improve strength of bil LE and improve ability to trasnfer from floor to bed independently.    Personal Factors and Comorbidities Comorbidity 2;Fitness;Time since onset of injury/illness/exacerbation;Transportation    Comorbidities hx of falls, HTN, MS    Examination-Activity Limitations Bed Mobility;Carry;Locomotion Level;Dressing;Stairs;Stand;Transfers;Squat    Examination-Participation Restrictions Cleaning;Community Activity;Meal Prep;Laundry;Shop    Stability/Clinical Decision Making Unstable/Unpredictable    Rehab Potential Fair    PT Frequency 1x / week    PT Duration 12 weeks    PT Treatment/Interventions ADLs/Self Care Home Management;Gait training;Stair training;Functional mobility training;Therapeutic activities;Therapeutic exercise;Balance training;Wheelchair mobility training;Passive range of motion;Orthotic Fit/Training;Energy conservation;Joint Manipulations;Manual techniques;Electrical Stimulation    PT Next Visit Plan --    PT Home Exercise Plan knee to chest and lower trunk rotations           Patient will benefit from skilled therapeutic intervention in order to improve the following deficits and impairments:  Abnormal gait, Decreased activity tolerance, Decreased balance, Decreased endurance, Decreased safety awareness, Decreased range of motion, Decreased mobility, Decreased strength, Difficulty walking, Increased edema, Impaired flexibility, Impaired tone, Impaired vision/preception  Visit Diagnosis: Unsteadiness on feet  Other abnormalities of gait and mobility  Muscle weakness (generalized)  Multiple sclerosis (HCC)     Problem List Patient Active Problem List   Diagnosis Date Noted  . Quadriplegia and quadriparesis ([place]) 03/08/2016  . Abnormality of gait 04/22/2013  . HTN (hypertension) 06/29/2012  . Multiple sclerosis ([place]) 06/29/2012  . Benign prostatic hyperplasia with urinary obstruction 06/29/2012     Kerrie Pleasure, PT 12/15/2019, 3:12 PM  [facility] [address] [place], [place], [address] Phone: [phone]   Fax:  [phone]  Name: Randy Shaw MRN: [id number] Date of Birth: 1960/11/27

## 2019-12-15 NOTE — Therapy (Deleted)
[facility] [address] [place], [place], [address] Phone: [phone]   Fax:  [phone]  Physical Therapy Evaluation  Patient Details  Name: Randy Shaw MRN: [id number] Date of Birth: [birth year] Referring Provider (PT): Butler Denmark, NP   Encounter Date: 12/15/2019   PT End of Session - 12/15/19 1500    Visit Number 13    Number of Visits 25    Date for PT Re-Evaluation 03/08/20    Authorization Type Eval 8/38/18, Recert 4/0/37, Recert 5/43/60 (1x OV/70 sessions)    PT Start Time 1315    PT Stop Time 1400    PT Time Calculation (min) 45 min    Equipment Utilized During Treatment Gait belt    Activity Tolerance Patient tolerated treatment well;Patient limited by fatigue    Behavior During Therapy [facility] for tasks assessed/performed           Past Medical History:  Diagnosis Date  . Abnormality of gait 04/22/2013  . Asthma   . BPH (benign prostatic hyperplasia)   . Hypertension   . Multiple sclerosis ([place])   . Quadriplegia and quadriparesis ([place]) 03/08/2016    Past Surgical History:  Procedure Laterality Date  . none      There were no vitals filed for this visit.    Subjective Assessment - 12/15/19 1454    Subjective Pt reports he had 2 more falls since last session while transferring from power chair to his bed. he spend 2-3 hours trying to get up but he couldn't and he ended up calling his brother to pick him of the floor and second time he had to call firefighters to help him pick off the floor.    Pertinent History BPH, hx of falls, MS, HTN    How long can you sit comfortably? no limits    How long can you stand comfortably? 2 min    How long can you walk comfortably? 5 steps with holding on to furniture    Patient Stated Goals Work on strengthening              [facility] PT Assessment - 12/15/19 0001      Strength   Left Hip Extension 1/5    Left Knee Flexion 1/5                       Objective measurements completed on examination: See above findings.                 PT Short Term Goals - 12/15/19 1501      PT SHORT TERM GOAL #1   Title Patient will be able to ambulate 10 feet with rolling walker to improve ability to go from bed to bathroom    Baseline able to ambulate in parallel bars 60 feet, have not attempted with rollator in PT yet.    Time 4    Period Weeks    Status Achieved    Target Date 10/11/19      PT SHORT TERM GOAL #2   Title Pt will be able to spend at least 2 hours a day and 3 days a week with using his rollator within his house to improve household ambulation    Baseline Pt is mainly using his power chair, pt reports of using 30 min with rollator 3 days/week    Time 4    Period Weeks    Status Not Met    Target Date 10/11/19  PT Long Term Goals - 12/15/19 1501      PT LONG TERM GOAL #1   Title Patient will be I and compliant with HEP to self manage his flexibility and improve strength    Baseline not doing any exericses, pt reports improved compliance with HEP    Time 8    Period Weeks    Status On-going    Target Date 01/26/20      PT LONG TERM GOAL #2   Title Pt will be able to use his rollator for at least 3 hours a day for 3-4 days a week to improve mobility with lesser AD.    Baseline Only using power chair    Time 8    Period Weeks    Status Not Met      PT LONG TERM GOAL #3   Title Pt will be able to don his AFO under 71mn to conserve energy and to improve safe ambulation.    Baseline currently taking 25 min to don his old AFO (10/02/19); pt is able to use swedish knee cage and able to don it in 5 min    Time 8    Period Weeks    Status Achieved      PT LONG TERM GOAL #4   Title Pt will be able to perform floor to sit transfer with CGA to be able to get up by himself at home.    Baseline max A    Time 6    Period Weeks    Status New    Target Date 01/26/20                   Plan - 12/15/19 1457    Clinical Impression Statement Patient has been seen for total of 13 sessions from 09/12/19 to 12/15/19. Pt is now able to don/doff swedish knee cage in under 5 min to help improve his hyperknee extension in L LE during gait. Patient now has fallen 3x in last 1.5 weeks. Patient has hard time getting up from the floor when fallen. Today's skilled session was focused on improving strength of L hamstrings and gluts and also R LE. Patient is not able to actively flex the l knee due to absent hamstring contraction in prone and supine and needs passive support to flex the knee. Patient was able to engage L hamstrings in gravity assisted position for only small part of the range in sitting position. Patient needed max A to get up from the floor from split lunge position with R LE forward and L knee down on floor. Pt uses bil UE and not able to push through LE with floor to stand transfers. Patient was able to push up on elbow and roll over to his back with floor to sit on mat table trasfner. Patient will benefit from skilled PT to improve strength of bil LE and improve ability to trasnfer from floor to bed independently.    Personal Factors and Comorbidities Comorbidity 2;Fitness;Time since onset of injury/illness/exacerbation;Transportation    Comorbidities hx of falls, HTN, MS    Examination-Activity Limitations Bed Mobility;Carry;Locomotion Level;Dressing;Stairs;Stand;Transfers;Squat    Examination-Participation Restrictions Cleaning;Community Activity;Meal Prep;Laundry;Shop    Stability/Clinical Decision Making Unstable/Unpredictable    Rehab Potential Fair    PT Frequency 1x / week    PT Duration 12 weeks    PT Treatment/Interventions ADLs/Self Care Home Management;Gait training;Stair training;Functional mobility training;Therapeutic activities;Therapeutic exercise;Balance training;Wheelchair mobility training;Passive range of motion;Orthotic Fit/Training;Energy  conservation;Joint Manipulations;Manual techniques;EPrintmaker  PT Next Visit Plan --    PT Home Exercise Plan knee to chest and lower trunk rotations           Patient will benefit from skilled therapeutic intervention in order to improve the following deficits and impairments:  Abnormal gait, Decreased activity tolerance, Decreased balance, Decreased endurance, Decreased safety awareness, Decreased range of motion, Decreased mobility, Decreased strength, Difficulty walking, Increased edema, Impaired flexibility, Impaired tone, Impaired vision/preception  Visit Diagnosis: Unsteadiness on feet - Plan: PT plan of care cert/re-cert  Other abnormalities of gait and mobility - Plan: PT plan of care cert/re-cert  Muscle weakness (generalized) - Plan: PT plan of care cert/re-cert  Multiple sclerosis ([place]) - Plan: PT plan of care cert/re-cert     Problem List Patient Active Problem List   Diagnosis Date Noted  . Quadriplegia and quadriparesis ([place]) 03/08/2016  . Abnormality of gait 04/22/2013  . HTN (hypertension) 06/29/2012  . Multiple sclerosis ([place]) 06/29/2012  . Benign prostatic hyperplasia with urinary obstruction 06/29/2012    Kerrie Pleasure 12/15/2019, 3:26 PM  [facility] [address] [place], [place], [address] Phone: [phone]   Fax:  [phone]  Name: Randy Shaw MRN: [id number] Date of Birth: 1960-06-30

## 2019-12-23 ENCOUNTER — Ambulatory Visit: Payer: 59

## 2019-12-30 ENCOUNTER — Other Ambulatory Visit: Payer: Self-pay

## 2019-12-30 ENCOUNTER — Ambulatory Visit: Payer: 59

## 2019-12-30 DIAGNOSIS — R2689 Other abnormalities of gait and mobility: Secondary | ICD-10-CM | POA: Diagnosis not present

## 2019-12-30 DIAGNOSIS — M6281 Muscle weakness (generalized): Secondary | ICD-10-CM

## 2019-12-30 DIAGNOSIS — R2681 Unsteadiness on feet: Secondary | ICD-10-CM

## 2019-12-30 DIAGNOSIS — G35 Multiple sclerosis: Secondary | ICD-10-CM

## 2019-12-30 NOTE — Therapy (Signed)
[facility] [address] [place], [place], [address] Phone: [phone]   Fax:  [phone]  Physical Therapy Treatment  Patient Details  Name: Randy Shaw MRN: [id number] Date of Birth: 1960/08/16 Referring Provider (PT): Butler Denmark, NP   Encounter Date: 12/30/2019   PT End of Session - 12/30/19 1044    Visit Number 14    Number of Visits 25    Date for PT Re-Evaluation 03/08/20    Authorization Type Eval 5/36/64, Recert 4/0/34, Recert 7/42/59 (1x DG/38 sessions)    PT Start Time 1000    PT Stop Time 1045    PT Time Calculation (min) 45 min    Equipment Utilized During Treatment Gait belt    Activity Tolerance Patient tolerated treatment well;Patient limited by fatigue    Behavior During Therapy [facility] for tasks assessed/performed           Past Medical History:  Diagnosis Date  . Abnormality of gait 04/22/2013  . Asthma   . BPH (benign prostatic hyperplasia)   . Hypertension   . Multiple sclerosis ([place])   . Quadriplegia and quadriparesis ([place]) 03/08/2016    Past Surgical History:  Procedure Laterality Date  . none      There were no vitals filed for this visit.   Subjective Assessment - 12/30/19 1005    Subjective Pt demonstrate poor memory and doesn't think he has  He had to wear catheter bag for couple of weeks which he got rid of yesterday.    Pertinent History BPH, hx of falls, MS, HTN    How long can you sit comfortably? no limits    How long can you stand comfortably? 2 min    How long can you walk comfortably? 5 steps with holding on to furniture    Patient Stated Goals Work on Hydrographic surveyor to mat transfer: CGA Mat to power chair transfer: min A due to lower mat table  SLR: 2 x 10 R and L, AA on L Supine hip abduction: 2 x 10 R and L, AA on L SAQ: 2 x 10 Manually stretched bil hip extensors and hamstrings Pt asked to don swedish knee cage. Pt  able to perform with SBA only. Educated to put it on when knee is bent for ease of donning.  seated isometric trunk twists: 5x 5" holds Seated isometric hip abduction and hip adduction: 10x Seated combined trunk twist and hip abduction/adduction: for multi group activation: 10x R and L                    PT Short Term Goals - 12/15/19 1501      PT SHORT TERM GOAL #1   Title Patient will be able to ambulate 10 feet with rolling walker to improve ability to go from bed to bathroom    Baseline able to ambulate in parallel bars 60 feet, have not attempted with rollator in PT yet.    Time 4    Period Weeks    Status Achieved    Target Date 10/11/19      PT SHORT TERM GOAL #2   Title Pt will be able to spend at least 2 hours a day and 3 days a week with using his rollator within his house to improve household ambulation    Baseline Pt is mainly using his power chair, pt  reports of using 30 min with rollator 3 days/week    Time 4    Period Weeks    Status Not Met    Target Date 10/11/19             PT Long Term Goals - 12/15/19 1501      PT LONG TERM GOAL #1   Title Patient will be I and compliant with HEP to self manage his flexibility and improve strength    Baseline not doing any exericses, pt reports improved compliance with HEP    Time 8    Period Weeks    Status On-going    Target Date 01/26/20      PT LONG TERM GOAL #2   Title Pt will be able to use his rollator for at least 3 hours a day for 3-4 days a week to improve mobility with lesser AD.    Baseline Only using power chair    Time 8    Period Weeks    Status Not Met      PT LONG TERM GOAL #3   Title Pt will be able to don his AFO under 39mn to conserve energy and to improve safe ambulation.    Baseline currently taking 25 min to don his old AFO (10/02/19); pt is able to use swedish knee cage and able to don it in 5 min    Time 8    Period Weeks    Status Achieved      PT LONG TERM GOAL #4    Title Pt will be able to perform floor to sit transfer with CGA to be able to get up by himself at home.    Baseline max A    Time 6    Period Weeks    Status New    Target Date 01/26/20                 Plan - 12/30/19 1043    Clinical Impression Statement Today's skilled session was focused on improving flexibility and working on strength training of legs. patient demonstrates increased tone in L LE. Patient demonstrates weakness with SLR on L LE and increased tone (diffuclt to relax with SAQ) on L LE. Pt able to don/doff swedish knee cage.    Personal Factors and Comorbidities Comorbidity 2;Fitness;Time since onset of injury/illness/exacerbation;Transportation    Comorbidities hx of falls, HTN, MS    Examination-Activity Limitations Bed Mobility;Carry;Locomotion Level;Dressing;Stairs;Stand;Transfers;Squat    Examination-Participation Restrictions Cleaning;Community Activity;Meal Prep;Laundry;Shop    Stability/Clinical Decision Making Unstable/Unpredictable    Rehab Potential Fair    PT Frequency 1x / week    PT Duration 12 weeks    PT Treatment/Interventions ADLs/Self Care Home Management;Gait training;Stair training;Functional mobility training;Therapeutic activities;Therapeutic exercise;Balance training;Wheelchair mobility training;Passive range of motion;Orthotic Fit/Training;Energy conservation;Joint Manipulations;Manual techniques;Electrical Stimulation    PT Home Exercise Plan knee to chest and lower trunk rotations           Patient will benefit from skilled therapeutic intervention in order to improve the following deficits and impairments:  Abnormal gait, Decreased activity tolerance, Decreased balance, Decreased endurance, Decreased safety awareness, Decreased range of motion, Decreased mobility, Decreased strength, Difficulty walking, Increased edema, Impaired flexibility, Impaired tone, Impaired vision/preception  Visit Diagnosis: Unsteadiness on feet  Other  abnormalities of gait and mobility  Muscle weakness (generalized)  Multiple sclerosis (HCC)     Problem List Patient Active Problem List   Diagnosis Date Noted  . Quadri[place]gia [place]riparesis ([place] 03/08/2016  . Abnormality  of gait 04/22/2013  . HTN (hypertension) 06/29/2012  . Multiple sclerosis ([place]) 06/29/2012  . Benign prostatic hyperplasia with urinary obstruction 06/29/2012    Kerrie Pleasure, PT 12/30/2019, 10:46 AM  [facility] [address] [place], [place], [address] Phone: [phone]   Fax:  [phone]  Name: Randy Shaw MRN: [id number] Date of Birth: 10-Oct-1960

## 2020-01-05 ENCOUNTER — Other Ambulatory Visit: Payer: Self-pay | Admitting: Family Medicine

## 2020-01-05 DIAGNOSIS — I10 Essential (primary) hypertension: Secondary | ICD-10-CM

## 2020-01-08 ENCOUNTER — Ambulatory Visit: Payer: 59

## 2020-01-20 ENCOUNTER — Encounter: Payer: Self-pay | Admitting: Cardiology

## 2020-01-20 ENCOUNTER — Other Ambulatory Visit: Payer: Self-pay

## 2020-01-20 ENCOUNTER — Ambulatory Visit (INDEPENDENT_AMBULATORY_CARE_PROVIDER_SITE_OTHER): Payer: 59 | Admitting: Cardiology

## 2020-01-20 VITALS — BP 125/74 | HR 79 | Temp 97.5°F | Ht 75.0 in

## 2020-01-20 DIAGNOSIS — I10 Essential (primary) hypertension: Secondary | ICD-10-CM | POA: Diagnosis not present

## 2020-01-20 DIAGNOSIS — R002 Palpitations: Secondary | ICD-10-CM

## 2020-01-20 DIAGNOSIS — Z7189 Other specified counseling: Secondary | ICD-10-CM | POA: Diagnosis not present

## 2020-01-20 DIAGNOSIS — R600 Localized edema: Secondary | ICD-10-CM | POA: Diagnosis not present

## 2020-01-20 NOTE — Patient Instructions (Signed)
Medication Instructions:  Your Physician recommend you continue on your current medication as directed.    *If you need a refill on your cardiac medications before your next appointment, please call your pharmacy*   Lab Work: None ordered   Testing/Procedures: Your physician has requested that you have an echocardiogram. Echocardiography is a painless test that uses sound waves to create images of your heart. It provides your doctor with information about the size and shape of your heart and how well your heart's chambers and valves are working. This procedure takes approximately one hour. There are no restrictions for this procedure. [address]   Follow-Up: At [facility], you and your health needs are our priority.  As part of our continuing mission to provide you with exceptional heart care, we have created designated Provider Care Teams.  These Care Teams include your primary Cardiologist (physician) and Advanced Practice Providers (APPs -  Physician Assistants and Nurse Practitioners) who all work together to provide you with the care you need, when you need it.  We recommend signing up for the patient portal called "MyChart".  Sign up information is provided on this After Visit Summary.  MyChart is used to connect with patients for Virtual Visits (Telemedicine).  Patients are able to view lab/test results, encounter notes, upcoming appointments, etc.  Non-urgent messages can be sent to your provider as well.   To learn more about what you can do with MyChart, go to [email or web address].    Your next appointment:   As needed  The format for your next appointment:   In Person  Provider:   Bridgette Christopher, MD     

## 2020-01-20 NOTE — Progress Notes (Signed)
Cardiology Office Note:    Date:  01/20/2020   ID:  Randy Shaw, DOB 03/11/1961, MRN [id number]  PCP:  Shade Flood, MD  Cardiologist:  Jodelle Red, MD  Referring MD: Shade Flood, MD   CC: new patient consultation for chest pain, LE edema, and abnormal ECG  History of Present Illness:    Randy Shaw is a 59 y.o. male with a hx of multiple sclerosis with quadriplegia, hypertension who is seen as a new consult at the request of Shade Flood, MD for the evaluation and management of chest pain, bilateral LE edema, and abnormal ECG.  Noe from 11/17/19 with Dr. Neva Seat reviewed. Noted history of lower extremity edema. Recommended for compression, elevation, sodium avoidance. In note dated 10/27/19 noted chest discomfort while doing dishes, no associated symptoms.  Today: Only thing he notices on occasion is a fast heartbeat when he gets up to do something. Yesterday he was cleaning and had to rest. No chest pain, breathing was fine, only noticed that his heart was beating fast/hard.  Has had no further chest discomfort since episode in June. He describe the episode less like chest pain and more like his heart was beating hard. Felt like he was running. No other associated symptoms.  Leg swelling has been a chronic issue. Left foot swells more than right. Cut back on salt, which helped some. Compression stocking help, but it takes him 45 minutes to put them on, so he doesn't wear them often. Isn't sure if it is better in the AM when he wakes up.   Cardiovascular risk factors: Prior clinical ASCVD: none Comorbid conditions: hypertension. Denies hyperlipidemia, diabetes, chronic kidney disease Metabolic syndrome/Obesity: BMI 26 Chronic inflammatory conditions: none Tobacco use history: never Family history: diabetes in his family (father, daughter). No heart issues. Prior cardiac testing and/or incidental findings on other testing (ie coronary  calcium):none Exercise level: limited mobility  Denies chest pain, shortness of breath at rest or with normal exertion. No PND, orthopnea, LE edema or unexpected weight gain. No syncope or palpitations. Doesn't sleep well, only a few hours/night. Sleeps in his bed.   Past Medical History:  Diagnosis Date  . Abnormality of gait 04/22/2013  . Asthma   . BPH (benign prostatic hyperplasia)   . Hypertension   . Multiple sclerosis (HCC)   . 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 none      Current Medications: Current Outpatient Medications on File Prior to Visit  Medication Sig  . Armodafinil 150 MG tablet Take 1 tablet (150 mg total) by mouth daily.  . baclofen (LIORESAL) 20 MG tablet TAKE 1 TABLET BY MOUTH IN THE MORNING, 1 TABLET AT NOON, AND 2 TABLETS AT NIGHT  . hydrochlorothiazide (MICROZIDE) 12.5 MG capsule TAKE 1 CAPSULE(12.5 MG) BY MOUTH DAILY  . MYRBETRIQ 50 MG TB24 tablet   . tamsulosin (FLOMAX) 0.4 MG CAPS capsule Take 1 capsule (0.4 mg total) by mouth daily.  . TECFIDERA 240 MG CPDR Take 1 capsule by mouth 2 (two) times daily.  . clotrimazole (LOTRIMIN) 1 % cream Apply 1 application topically 2 (two) times daily. To any wet areas between toes for 1 week.   Current Facility-Administered Medications on File Prior to Visit  Medication  . gadopentetate dimeglumine (MAGNEVIST) injection 20 mL     Allergies:   Peanut-containing drug products and Zanaflex [tizanidine hcl]   Social History   Tobacco Use  .  Smoking status: Never Smoker  . Smokeless tobacco: Never Used  Substance Use Topics  . Alcohol use: Yes    Alcohol/week: 0.0 standard drinks    Comment: occasionally wine cooler  . Drug use: No    Family History: family history includes Cancer in his mother; Cancer - Colon in his father; Stroke in his father. There is no history of Multiple sclerosis.  ROS:   Please see the history of present  illness.  Additional pertinent ROS: Constitutional: Negative for chills, fever, night sweats, unintentional weight loss  HENT: Negative for ear pain and hearing loss.   Eyes: Negative for loss of vision and eye pain.  Respiratory: Negative for cough, sputum, wheezing.   Cardiovascular: See HPI. Gastrointestinal: Negative for abdominal pain, melena, and hematochezia.  Genitourinary: Negative for dysuria and hematuria.  Musculoskeletal: Negative for falls and myalgias.  Skin: Negative for itching and rash.  Neurological: Negative for focal weakness, focal sensory changes and loss of consciousness.  Endo/Heme/Allergies: Does not bruise/bleed easily.     EKGs/Labs/Other Studies Reviewed:    The following studies were reviewed today: No prior cardiac studies  EKG:  EKG is personally reviewed.  The ekg ordered today demonstrates NSR at 79 bpm  Recent Labs: 08/06/2019: ALT 26; Hemoglobin 14.2; Platelets 229 10/27/2019: BUN 18; Creatinine, Ser 0.89; Potassium 4.0; Sodium 143  Recent Lipid Panel    Component Value Date/Time   CHOL 233 (H) 01/01/2019 1107   TRIG 98 01/01/2019 1107   HDL 67 01/01/2019 1107   CHOLHDL 3.5 01/01/2019 1107   CHOLHDL 3.4 07/30/2013 0952   VLDL 22 07/30/2013 0952   LDLCALC 149 (H) 01/01/2019 1107    Physical Exam:    VS:  BP 125/74   Pulse 79   Temp (!) 97.5 F (36.4 C)   Ht 6\' 3"  (1.905 m)   SpO2 99%   BMI 26.25 kg/m     Wt Readings from Last 3 Encounters:  11/17/19 210 lb (95.3 kg)  08/06/19 210 lb (95.3 kg)  02/05/19 210 lb (95.3 kg)    GEN: Well nourished, well developed in no acute distress HEENT: Normal, moist mucous membranes NECK: No JVD CARDIAC: regular rhythm, normal S1 and S2, no rubs or gallops. No murmurs. VASCULAR: Radial and DP pulses 2+ bilaterally. No carotid bruits RESPIRATORY:  Clear to auscultation without rales, wheezing or rhonchi  ABDOMEN: Soft, non-tender, non-distended MUSCULOSKELETAL:  Ambulates independently SKIN:  Warm and dry, trivial bilateral LE edema NEUROLOGIC:  Alert and oriented x 3. No focal neuro deficits noted. PSYCHIATRIC:  Normal affect    ASSESSMENT:    1. Palpitation   2. Lower extremity edema   3. Essential hypertension   4. Cardiac risk counseling   5. Counseling on health promotion and disease prevention    PLAN:    LE edema -will get echocardiogram to exclude cardiac cause  Palpitations -we discussed the potential causes of fast heart rates and palpitations today. Reviewed the normal electrical system of the heart. Reviewed the role of the sinus node. Reviewed the balance between resting (vagal) tone and fight or flight nervous system input. Reviewed that sinus tachycardia, or elevated sinus rate, is usually secondary to something else in the body. This can include pain, stress, infection, anxiety, hormone imbalance, low blood counts, etc. Discussed that we do not typically treat sinus tachycardia by itself, and instead the focus is on finding what is driving the heart rate and treating that. We discussed that there can be other rhythm issues,  from either the top or bottom of the heart, that are abnormal rhythms. Discussed how we evaluate for these. -after shared decision making, will continue to watch and consider monitor if symptoms worse  Hypertension: at goal today -continue HCTZ  Cardiac risk counseling and prevention recommendations: -recommend heart healthy/Mediterranean diet, with whole grains, fruits, vegetable, fish, lean meats, nuts, and olive oil. Limit salt. -recommend avoidance of tobacco products. Avoid excess alcohol. -ASCVD risk score: The 10-year ASCVD risk score Denman George DC Jr., et al., 2013) is: 12.3%   Values used to calculate the score:     Age: 66 years     Sex: Male     Is Non-Hispanic African American: Yes     Diabetic: No     Tobacco smoker: No     Systolic Blood Pressure: 125 mmHg     Is BP treated: Yes     HDL Cholesterol: 67 mg/dL     Total  Cholesterol: 233 mg/dL    Plan for follow up: if echo is unremarkable, I would be happy to see him again as needed if symptoms recur  Jodelle Red, MD, PhD [place]  [facility]    Medication Adjustments/Labs and Tests Ordered: Current medicines are reviewed at length with the patient today.  Concerns regarding medicines are outlined above.  Orders Placed This Encounter  Procedures  . EKG 12-Lead  . ECHOCARDIOGRAM COMPLETE   No orders of the defined types were placed in this encounter.   Patient Instructions  Medication Instructions:  Your Physician recommend you continue on your current medication as directed.    *If you need a refill on your cardiac medications before your next appointment, please call your pharmacy*   Lab Work: None ordered  Testing/Procedures: Your physician has requested that you have an echocardiogram. Echocardiography is a painless test that uses sound waves to create images of your heart. It provides your doctor with information about the size and shape of your heart and how well your heart's chambers and valves are working. This procedure takes approximately one hour. There are no restrictions for this procedure. [address]    Follow-Up: At [facility], you and your health needs are our priority.  As part of our continuing mission to provide you with exceptional heart care, we have created designated Provider Care Teams.  These Care Teams include your primary Cardiologist (physician) and Advanced Practice Providers (APPs -  Physician Assistants and Nurse Practitioners) who all work together to provide you with the care you need, when you need it.  We recommend signing up for the patient portal called "MyChart".  Sign up information is provided on this After Visit Summary.  MyChart is used to connect with patients for Virtual Visits (Telemedicine).  Patients are able to view lab/test results, encounter notes,  upcoming appointments, etc.  Non-urgent messages can be sent to your provider as well.   To learn more about what you can do with MyChart, go to [email or web address].    Your next appointment:   As needed  The format for your next appointment:   In Person  Provider:   Jodelle Red, MD      Signed, Jodelle Red, MD PhD 01/20/2020   [facility]

## 2020-01-23 ENCOUNTER — Ambulatory Visit: Payer: 59 | Attending: Neurology

## 2020-01-23 ENCOUNTER — Other Ambulatory Visit: Payer: Self-pay

## 2020-01-23 DIAGNOSIS — R2681 Unsteadiness on feet: Secondary | ICD-10-CM | POA: Insufficient documentation

## 2020-01-23 DIAGNOSIS — M6281 Muscle weakness (generalized): Secondary | ICD-10-CM | POA: Diagnosis present

## 2020-01-23 DIAGNOSIS — R2689 Other abnormalities of gait and mobility: Secondary | ICD-10-CM | POA: Diagnosis present

## 2020-01-23 DIAGNOSIS — G35 Multiple sclerosis: Secondary | ICD-10-CM

## 2020-01-23 NOTE — Therapy (Addendum)
[facility] [address] [place], [place], [address] Phone: [phone]   Fax:  [phone]  Physical Therapy re-certification  Patient Details  Name: Randy Shaw MRN: [id number] Date of Birth: 08-Jul-1960 Referring Provider (PT): Butler Denmark, NP   Encounter Date: 01/23/2020   PT End of Session - 01/26/20 1903    Visit Number 15    Number of Visits 25    Date for PT Re-Evaluation 03/22/20    Authorization Type Eval 07/23/38, Recert 1/0/27, Recert 2/53/66 (1x YQ/03 sessions); recert 4/74/25    Progress Note Due on Visit 23    PT Start Time 1400    PT Stop Time 1445    PT Time Calculation (min) 45 min    Equipment Utilized During Treatment Gait belt    Activity Tolerance Patient tolerated treatment well;Patient limited by fatigue    Behavior During Therapy [facility] for tasks assessed/performed           Past Medical History:  Diagnosis Date  . Abnormality of gait 04/22/2013  . Asthma   . BPH (benign prostatic hyperplasia)   . Hypertension   . Multiple sclerosis ([place])   . Quadriplegia and quadriparesis ([place]) 03/08/2016    Past Surgical History:  Procedure Laterality Date  . none      There were no vitals filed for this visit.        passively stretched bil hamstrings Supine slow reversal with quick stretch: ankle inversion/eversion, plantarflexion/dorsiflexion, hip abduction/adduction Seated slow reversal with quick stretch: knee flexion/extension, hip internal and external rotations Standing lateral weight shifts: 1 x 7', 1 x 5' cues to shift weight to neutral as patient had excessive lean to R as he got tired.                        PT Short Term Goals - 01/26/20 1902      PT SHORT TERM GOAL #1   Title Patient will be able to ambulate 10 feet with rolling walker to improve ability to go from bed to bathroom    Baseline able to ambulate in parallel bars 60 feet, have not  attempted with rollator in PT yet.    Time 4    Period Weeks    Status Achieved    Target Date 10/11/19      PT SHORT TERM GOAL #2   Title Pt will be able to spend at least 2 hours a day and 3 days a week with using his rollator within his house to improve household ambulation    Baseline Pt is mainly using his power chair, pt reports of using 30 min with rollator 3 days/week    Time 4    Period Weeks    Status Not Met    Target Date 10/11/19             PT Long Term Goals - 01/26/20 1856      PT LONG TERM GOAL #1   Title Patient will be I and compliant with HEP to self manage his flexibility and improve strength    Baseline not doing any exericses, pt reports improved compliance with HEP    Time 8    Period Weeks    Status On-going      PT LONG TERM GOAL #2   Title Pt will be able to use his rollator for at least 3 hours a day for 3-4 days a week to improve mobility with  lesser AD.    Baseline Only using power chair    Time 8    Period Weeks    Status Not Met      PT LONG TERM GOAL #3   Title Pt will be able to don his AFO under 59mn to conserve energy and to improve safe ambulation.    Baseline currently taking 25 min to don his old AFO (10/02/19); pt is able to use swedish knee cage and able to don it in 5 min    Time 8    Period Weeks    Status Achieved      PT LONG TERM GOAL #4   Title Pt will be able to perform floor to sit transfer with CGA to be able to get up by himself at home.    Baseline max A    Time 6    Period Weeks    Status Not Met               01/23/20 1440  Plan  Clinical Impression Statement Patient has been seen for total of 15 sessions for generalied weakness, decreased transfers and gait difficulties due to Multiple Sclerosis from 09/12/19 to 01/23/20. Patient has recently received swedish knee cage orthotics for his L knee to prevent hyperextension. This has significantly helped to reduce L knee hyperextension with ambulation and has  improved L foot clearance during the gait. Patient's mobility is significantly impacted by the increased muscle tone in his L LE. Patient has only met long term goal #3. New appropriate goals will be established today to improve patient's functional transfers and mobility.  Personal Factors and Comorbidities Comorbidity 2;Fitness;Time since onset of injury/illness/exacerbation;Transportation  Comorbidities hx of falls, HTN, MS  Examination-Activity Limitations Bed Mobility;Carry;Locomotion Level;Dressing;Stairs;Stand;Transfers;Squat  Examination-Participation Restrictions Cleaning;Community Activity;Meal Prep;Laundry;Shop  Pt will benefit from skilled therapeutic intervention in order to improve on the following deficits Abnormal gait;Decreased activity tolerance;Decreased balance;Decreased endurance;Decreased safety awareness;Decreased range of motion;Decreased mobility;Decreased strength;Difficulty walking;Increased edema;Impaired flexibility;Impaired tone;Impaired vision/preception  Stability/Clinical Decision Making Unstable/Unpredictable  Clinical Decision Making High  Rehab Potential Fair  PT Frequency 1x / week  PT Duration 8 weeks  PT Treatment/Interventions ADLs/Self Care Home Management;Gait training;Stair training;Functional mobility training;Therapeutic activities;Therapeutic exercise;Balance training;Wheelchair mobility training;Passive range of motion;Orthotic Fit/Training;Energy conservation;Joint Manipulations;Manual techniques;Electrical Stimulation  PT Next Visit Plan Perform a TUG with rollator (update LTG # 1 goal). Do 198m test and update LTG # 2; work on manually stretching hip extensors, hamstrings and calves; gait training with rollator and swedish knee cage  Consulted and Agree with Plan of Care Patient        Patient will benefit from skilled therapeutic intervention in order to improve the following deficits and impairments:  Abnormal gait, Decreased activity  tolerance, Decreased balance, Decreased endurance, Decreased safety awareness, Decreased range of motion, Decreased mobility, Decreased strength, Difficulty walking, Increased edema, Impaired flexibility, Impaired tone, Impaired vision/preception  Visit Diagnosis: Unsteadiness on feet  Multiple sclerosis (HCC)  Other abnormalities of gait and mobility  Muscle weakness (generalized)     Problem List Patient Active Problem List   Diagnosis Date Noted  . Quadriplegia and quadriparesis ([place]11/11/2015  . Abnormality of gait 04/22/2013  . HTN (hypertension) 06/29/2012  . Multiple sclerosis ([place]/05/2012  . Benign prostatic hyperplasia with urinary obstruction 06/29/2012    KaKerrie PleasurePT 01/26/2020, 7:05 PM  [place]u[id number]: [phone] Fax:  [phone]: Randy Shaw  Randy Shaw MRN: [id number] Date of Birth: [birth year]

## 2020-01-26 NOTE — Addendum Note (Signed)
Addended by: [name] on: 01/26/2020 07:06 PM   Modules accepted: Orders

## 2020-01-27 ENCOUNTER — Ambulatory Visit: Payer: 59

## 2020-01-27 ENCOUNTER — Other Ambulatory Visit: Payer: Self-pay

## 2020-01-27 DIAGNOSIS — M6281 Muscle weakness (generalized): Secondary | ICD-10-CM

## 2020-01-27 DIAGNOSIS — R2689 Other abnormalities of gait and mobility: Secondary | ICD-10-CM

## 2020-01-27 DIAGNOSIS — R2681 Unsteadiness on feet: Secondary | ICD-10-CM | POA: Diagnosis not present

## 2020-01-28 ENCOUNTER — Telehealth: Payer: Self-pay | Admitting: Neurology

## 2020-01-28 NOTE — Therapy (Addendum)
[facility] [address] [place], [place], [address] Phone: [phone]   Fax:  [phone]  Physical Therapy Treatment  Patient Details  Name: Randy Shaw MRN: [id number] Date of Birth: December 14, 1960 Referring Provider (PT): Margie Ege, NP   Encounter Date: 01/27/2020   PT End of Session - 01/27/20 1619    Visit Number 16    Number of Visits 25    Date for PT Re-Evaluation 03/22/20    Authorization Type Eval 09/12/19, Recert 10/07/19, Recert 12/15/19 (1x wk/12 sessions); recert 01/26/20    Progress Note Due on Visit 23    PT Start Time 1615    PT Stop Time 1704    PT Time Calculation (min) 49 min    Equipment Utilized During Treatment Gait belt    Activity Tolerance Patient tolerated treatment well;Patient limited by fatigue    Behavior During Therapy [facility] for tasks assessed/performed           Past Medical History:  Diagnosis Date  . Abnormality of gait 04/22/2013  . Asthma   . BPH (benign prostatic hyperplasia)   . Hypertension   . Multiple sclerosis (HCC)   . 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 none      There were no vitals filed for this visit.   Subjective Assessment - 01/27/20 1617    Subjective Pt reports that he has been out of the armodafinil the past week but goes to the doctor soon so not too worried. Denies any falls. Pt reports that he is happy with the new knee cage. Does not walk at home.    Pertinent History BPH, hx of falls, MS, HTN    How long can you sit comfortably? no limits    How long can you stand comfortably? 2 min    How long can you walk comfortably? 5 steps with holding on to furniture    Patient Stated Goals Work on strengthening    Currently in Pain? No/denies              [facility] PT Assessment - 01/27/20 1621      Strength   Right Hip Flexion 3+/5    Left Hip Flexion 1/5    Right Knee Flexion 3+/5     Right Knee Extension 4/5    Left Knee Flexion 2-/5    Left Knee Extension 3+/5    Right Ankle Dorsiflexion 4/5    Left Ankle Dorsiflexion 0/5                         [facility] Adult PT Treatment/Exercise - 01/27/20 1621      Transfers   Transfers Sit to Stand;Stand Pivot Transfers;Stand to Sit    Sit to Stand 4: Min guard;3: Mod assist    Sit to Stand Details Verbal cues for technique    Sit to Stand Details (indicate cue type and reason) Pt needs arms to push/pull up to stand. CGA from powerchair/ mat and in // bars from manual w/c (with pulling on bars). Mod assist from manual w/c with 2nd person for safety.    Stand to Sit 4: Min guard    Stand Pivot Transfers 4: Min guard;4: Administrator, arts Details (indicate cue type and reason) Pt initially demonstrated transfer from powerchair to mat going to left with turning almost a full 360 to get to  the mat with holding on to mat and leanring over sliding left leg around. PT asked why he chose to turn all the way around and said he's not sure as he doesn't usually do that at home. Suggested going to the right and just moving 45 degrees would be much easier. Utilized walker to go to right from mat to w/c and from manual w/c to powerchair and was CGA as able to slide left foot back.      Ambulation/Gait   Ambulation/Gait Yes    Ambulation/Gait Assistance 3: Mod assist    Ambulation/Gait Assistance Details Pt needed mod assist to advance LLE with forwards gait and CGA for safety backing up. Pt lacking left hip flexion to advance leg leaning to right using trunk to try to swing leg through. Heavy UE support needed to assist. Pt was cued to try to stay up tall.    Ambulation Distance (Feet) 24 Feet   broken in to 6' x 4 with 2 forwards and 2 backwards   Assistive device Parallel bars   left knee cage, left anterior AFO   Gait Pattern Step-to pattern;Decreased stance time - left;Decreased hip/knee flexion - left;Decreased  dorsiflexion - left;Poor foot clearance - left    Ambulation Surface Level;Indoor    Gait Comments Pt performed standing in front of mat working on standing in upright posture with cues to tighten tummy and tactile cues at chest x 3 min. During course had pt let go with RUE and raise arm overhead x 10. In // bars standing with stepping forward and back with RLE x 10 with tactile cues to tighten left glut during stance. Pt locks left leg out against knee cage for stability. Standing working on upright posture with reaching for targets x 1 min. PT provided verbal and tactile cues to try to stand up straighter. PT did note that knee cage kept rotating medially during standing.                  PT Education - 01/28/20 [phone]    Education Details PT discussed with pt that trying to walk at home currently would not be safe due to inability to clear left foot. Pt reports that he has not walked for some time at home.    Person(s) Educated Patient    Methods Explanation    Comprehension Verbalized understanding            PT Short Term Goals - 01/26/20 1906      PT SHORT TERM GOAL #1   Title Patient will be able to ambulate 7' with rolling walker to improve household ambulation.    Baseline 40' max with rollator    Time 4    Period Weeks    Status Revised    Target Date 02/23/20             PT Long Term Goals - 01/26/20 1908      PT LONG TERM GOAL #1   Title TUG goal    Baseline TBD    Time 8    Period Weeks    Status Revised    Target Date 03/22/20      PT LONG TERM GOAL #2   Title Patient will demo 0.41m/s improvement in 10 m/s with rolling walker to improve household ambulation    Baseline TBD    Time 8    Period Weeks    Status Revised    Target Date 03/22/20  Plan - 01/28/20 0240    Clinical Impression Statement PT focused on standing with working on balance with swedish knee cage. Assessed gait again in // bars and pt is unable to advance  LLE due to lack of hip flexion. Pt locks left knee against swe[place] knee cage for stability on left. Trialed use of left AFO as well to assist with left ankle DF but pt still has to lean to right excessively using trunk to advance LLE. Pt reports orthotist had said a new AFO would not be appopriate at this time. Unable to assess TUG or gait speed due to assistance level pt needs with gait being max assist to clear left foot even in // bars. May want to look more at standing balance and transfer safey at this time.    Personal Factors and Comorbidities Comorbidity 2;Fitness;Time since onset of injury/illness/exacerbation;Transportation    Comorbidities hx of falls, HTN, MS    Examination-Activity Limitations Bed Mobility;Carry;Locomotion Level;Dressing;Stairs;Stand;Transfers;Squat    Examination-Participation Restrictions Cleaning;Community Activity;Meal Prep;Laundry;Shop    Stability/Clinical Decision Making Unstable/Unpredictable    Rehab Potential Fair    PT Frequency 1x / week    PT Duration 8 weeks    PT Treatment/Interventions ADLs/Self Care Home Management;Gait training;Stair training;Functional mobility training;Therapeutic activities;Therapeutic exercise;Balance training;Wheelchair mobility training;Passive range of motion;Orthotic Fit/Training;Energy conservation;Joint Manipulations;Manual techniques;Electrical Stimulation    PT Next Visit Plan Pt's goals need to be updated to be appropriate for pt. With his level of weakness in left leg he is not able to clear LLE to allow safe ambulation at home alone. Work more on transfer safey perhaps utilizing RW with going to the right as pt can slide left leg back. Standing balance with focus on more core as pt flexed at trunk in standing.    Consulted and Agree with Plan of Care Patient           Patient will benefit from skilled therapeutic intervention in order to improve the following deficits and impairments:  Abnormal gait, Decreased activity  tolerance, Decreased balance, Decreased endurance, Decreased safety awareness, Decreased range of motion, Decreased mobility, Decreased strength, Difficulty walking, Increased edema, Impaired flexibility, Impaired tone, Impaired vision/preception  Visit Diagnosis: Muscle weakness (generalized)  Other abnormalities of gait and mobility     Problem List Patient Active Problem List   Diagnosis Date Noted  . 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  . Abnormality of gait 04/22/2013  . HTN (hypertension) 06/29/2012  . Multiple sclerosis (HCC) 06/29/2012  . Benign prostatic hyperplasia with urinary obstruction 06/29/2012    Ronn Melena, PT, DPT, NCS 01/28/2020, 8:31 AM  [facility] [address] [place], [place], [address] Phone: [phone]   Fax:  [phone]  Name: Randy Shaw MRN: [id number] Date of Birth: 1960-11-04

## 2020-01-28 NOTE — Telephone Encounter (Signed)
LVM informing pt that disability placard application paperwork is ready to be picked up.

## 2020-02-02 ENCOUNTER — Ambulatory Visit (HOSPITAL_COMMUNITY): Payer: 59 | Attending: Cardiovascular Disease

## 2020-02-02 ENCOUNTER — Other Ambulatory Visit: Payer: Self-pay

## 2020-02-02 DIAGNOSIS — R002 Palpitations: Secondary | ICD-10-CM | POA: Diagnosis not present

## 2020-02-02 DIAGNOSIS — R600 Localized edema: Secondary | ICD-10-CM | POA: Diagnosis present

## 2020-02-02 LAB — ECHOCARDIOGRAM COMPLETE
Area-P 1/2: 3.2 cm2
P 1/2 time: 466 msec
S' Lateral: 3.1 cm

## 2020-02-05 ENCOUNTER — Ambulatory Visit: Payer: 59 | Attending: Neurology | Admitting: Physical Therapy

## 2020-02-05 ENCOUNTER — Other Ambulatory Visit: Payer: Self-pay

## 2020-02-05 ENCOUNTER — Encounter: Payer: Self-pay | Admitting: Physical Therapy

## 2020-02-05 ENCOUNTER — Encounter: Payer: Self-pay | Admitting: Neurology

## 2020-02-05 ENCOUNTER — Ambulatory Visit (INDEPENDENT_AMBULATORY_CARE_PROVIDER_SITE_OTHER): Payer: 59 | Admitting: Neurology

## 2020-02-05 VITALS — BP 120/72 | HR 68

## 2020-02-05 DIAGNOSIS — R2681 Unsteadiness on feet: Secondary | ICD-10-CM | POA: Insufficient documentation

## 2020-02-05 DIAGNOSIS — G8250 Quadriplegia, unspecified: Secondary | ICD-10-CM | POA: Diagnosis not present

## 2020-02-05 DIAGNOSIS — R2689 Other abnormalities of gait and mobility: Secondary | ICD-10-CM | POA: Insufficient documentation

## 2020-02-05 DIAGNOSIS — R293 Abnormal posture: Secondary | ICD-10-CM | POA: Insufficient documentation

## 2020-02-05 DIAGNOSIS — R262 Difficulty in walking, not elsewhere classified: Secondary | ICD-10-CM | POA: Diagnosis present

## 2020-02-05 DIAGNOSIS — M6281 Muscle weakness (generalized): Secondary | ICD-10-CM | POA: Diagnosis present

## 2020-02-05 DIAGNOSIS — G35 Multiple sclerosis: Secondary | ICD-10-CM

## 2020-02-05 MED ORDER — ARMODAFINIL 150 MG PO TABS: 150.0000 mg | ORAL_TABLET | Freq: Every day | ORAL | 5 refills | Status: DC

## 2020-02-05 MED ORDER — TIZANIDINE HCL 2 MG PO TABS: 2.0000 mg | ORAL_TABLET | Freq: Three times a day (TID) | ORAL | 0 refills | Status: DC | PRN

## 2020-02-05 MED ORDER — TECFIDERA 240 MG PO CPDR: 1.0000 | DELAYED_RELEASE_CAPSULE | Freq: Two times a day (BID) | ORAL | 11 refills | Status: DC

## 2020-02-05 NOTE — Therapy (Signed)
[facility] [address] [place], [place], [address] Phone: [phone]   Fax:  [phone]  Physical Therapy Treatment  Patient Details  Name: Randy Shaw MRN: [id number] Date of Birth: 20-Feb-1961 Referring Provider (PT): Margie Ege, NP   Encounter Date: 02/05/2020   PT End of Session - 02/05/20 1300    Visit Number 17    Number of Visits 25    Date for PT Re-Evaluation 03/22/20    Authorization Type Eval 09/12/19, Recert 10/07/19, Recert 12/15/19 (1x wk/12 sessions); recert 01/26/20    Progress Note Due on Visit 23    PT Start Time 1312    PT Stop Time 1400    PT Time Calculation (min) 48 min    Equipment Utilized During Treatment Gait belt    Activity Tolerance Patient tolerated treatment well;Patient limited by fatigue    Behavior During Therapy [facility] for tasks assessed/performed           Past Medical History:  Diagnosis Date  . Abnormality of gait 04/22/2013  . Asthma   . BPH (benign prostatic hyperplasia)   . Hypertension   . Multiple sclerosis (HCC)   . 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 none      There were no vitals filed for this visit.   Subjective Assessment - 02/05/20 1309    Subjective Pt reports no new changes since last PT session. Denies falls. Pt does note that he has been prescribed new medication this morning to "help increase my flexibility."    Pertinent History BPH, hx of falls, MS, HTN    How long can you sit comfortably? no limits    How long can you stand comfortably? 2 min    How long can you walk comfortably? 5 steps with holding on to furniture    Patient Stated Goals Work on strengthening    Currently in Pain? No/denies                             [facility] Adult PT Treatment/Exercise - 02/05/20 0001      Transfers   Transfers Sit to Stand;Stand Pivot Transfers;Stand to Sit    Sit to  Stand 4: Min guard    Sit to Stand Details Manual facilitation for weight shifting;Manual facilitation for weight bearing;Tactile cues for weight shifting    Sit to Stand Details (indicate cue type and reason) Worked on increased L LE weight bearing with right foot placed slightly forward 2x5    Stand to Sit 4: Min guard    Stand Pivot Transfers 4: Min guard;4: Administrator, arts Details (indicate cue type and reason) Requires increased use of UEs      Ambulation/Gait   Pre-Gait Activities Lateral weight shifting L<>R x 10; forward/backward stepping with RW x 10; forward stance/warrior pose 2x5 bilat with 5 sec hold      Knee/Hip Exercises: Stretches   Geophysical data processor Limitations In standing in walker    Soleus Stretch 5 reps;10 seconds    Soleus Stretch Limitations In seated      Knee/Hip Exercises: Standing   Terminal Knee Extension AAROM;Strengthening;Left    Terminal Knee Extension Limitations staggered stance with manual and verbal cueing      Knee/Hip Exercises: Seated   Heel Slides AAROM;Left;10 reps;3 sets  Clamshell with TheraBand Red   3x10     Knee/Hip Exercises: Supine   Straight Leg Raises Strengthening;Left;10 reps      Manual Therapy   Manual Therapy Joint mobilization    Manual therapy comments Manual stretch with grade III AP talocrural mob                    PT Short Term Goals - 01/26/20 1906      PT SHORT TERM GOAL #1   Title Patient will be able to ambulate 40' with rolling walker to improve household ambulation.    Baseline 40' max with rollator    Time 4    Period Weeks    Status Revised    Target Date 02/23/20             PT Long Term Goals - 01/26/20 1908      PT LONG TERM GOAL #1   Title TUG goal    Baseline TBD    Time 8    Period Weeks    Status Revised    Target Date 03/22/20      PT LONG TERM GOAL #2   Title Patient will demo 0.6m/s improvement in 10 m/s with rolling  walker to improve household ambulation    Baseline TBD    Time 8    Period Weeks    Status Revised    Target Date 03/22/20                 Plan - 02/05/20 1406    Clinical Impression Statement Treatment focused on increased quad and hip muscle activation in seated and standing. Provided pt ankle mobilization to impove ankle ROM for standing (limited due to extensor tone). Pt utilizes hip circumduction to advance L LE in pre-gait activities. Did not attempt TUG or gait speed at this time until pt has improved foot clearance (pt notes it has been months since he has been able to move his L ankle).    Personal Factors and Comorbidities Comorbidity 2;Fitness;Time since onset of injury/illness/exacerbation;Transportation    Comorbidities hx of falls, HTN, MS    Examination-Activity Limitations Bed Mobility;Carry;Locomotion Level;Dressing;Stairs;Stand;Transfers;Squat    Examination-Participation Restrictions Cleaning;Community Activity;Meal Prep;Laundry;Shop    Stability/Clinical Decision Making Unstable/Unpredictable    Rehab Potential Fair    PT Frequency 1x / week    PT Duration 8 weeks    PT Treatment/Interventions ADLs/Self Care Home Management;Gait training;Stair training;Functional mobility training;Therapeutic activities;Therapeutic exercise;Balance training;Wheelchair mobility training;Passive range of motion;Orthotic Fit/Training;Energy conservation;Joint Manipulations;Manual techniques;Electrical Stimulation    PT Next Visit Plan Re-update pt goals? With his level of weakness in left leg he is not able to clear LLE to allow safe ambulation at home alone. Working on standing, pre-gait, and gait activities as able, and safe transfers.    PT Home Exercise Plan Access Code: [email or web address]; heel slide, seated clam shell, knee to chest and lower trunk rotations    Consulted and Agree with Plan of Care Patient           Patient will benefit from skilled therapeutic intervention in order to  improve the following deficits and impairments:  Abnormal gait, Decreased activity tolerance, Decreased balance, Decreased endurance, Decreased safety awareness, Decreased range of motion, Decreased mobility, Decreased strength, Difficulty walking, Increased edema, Impaired flexibility, Impaired tone, Impaired vision/preception  Visit Diagnosis: Abnormal posture  Difficulty in walking, not elsewhere classified  Muscle weakness (generalized)  Other abnormalities of gait and mobility  Multiple sclerosis (HCC)  Problem List Patient Active Problem List   Diagnosis Date Noted  . 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  . Abnormality of gait 04/22/2013  . HTN (hypertension) 06/29/2012  . Multiple sclerosis (HCC) 06/29/2012  . Benign prostatic hyperplasia with urinary obstruction 06/29/2012    Randy Shaw Randy Shaw PT, DPT 02/05/2020, 2:26 PM  [facility] [address] [place], [place], [address] Phone: [phone]   Fax:  [phone]  Name: Randy Shaw MRN: [id number] Date of Birth: [birth year]

## 2020-02-05 NOTE — Patient Instructions (Signed)
Try over the counter melatonin to help with sleep (small dosage, 1 or 2 mg to start) Continue other medications Try Tizanidine for muscle spasticity, remember you had previous allergy, could have ben a fluke we talked about, retry, be very alert! Check blood work today  Continue PT, wear your knee brace  See you back in 6 months

## 2020-02-05 NOTE — Progress Notes (Signed)
PATIENT: Randy Shaw DOB: [birth year]  REASON FOR VISIT: follow up HISTORY FROM: patient  HISTORY OF PRESENT ILLNESS: Today 02/05/20 Randy Shaw is a 59 year old male with history of multiple sclerosis on Tecfidera.  He uses a motorized wheelchair, lives alone, does his own ADLs, takes this [employer] bus. Is very positive, upbeat, outgoing.   MS overall stable.  He has left INO, double vision when looking to the right, recently saw ophthalmology.  Has urinary urgency, follows with Dr. Annabell Howells urology, on Myrbetriq, Flomax.  Working with PT, has been specially fitted for a left knee brace, working on walking in therapy, admits does not do this much at home, is easier to ride in the wheelchair.  With the walking, noted increase spasticity to the left leg.  Chart reports allergy to tizanidine, SOB, he does not remember this, would like to retry the medicine, it was mild.  Also on Nuvigil, ran out of the medication.  Not sleeping well, waking up 2 hours after sleep.  He was switched to generic Tecfidera, then back to brand name. MRI of the brain and cervical spine in May 2021 were overall stable.  Here today for follow-up unaccompanied.  HISTORY 08/06/2019 SS: Randy Shaw 59 year old male with history of multiple sclerosis on Tecfidera.  He uses a motorized wheelchair.  He has a documented left INO, has double vision when looking to the right, has seen ophthalmology recently.  He remains on baclofen and Nuvigil.  His Tecfidera was switched to generic, then went back to brand name.  He has had a few falls, lost his balance while standing, trying to do activities.  Denies any new numbness or weakness, has chronic weakness on the left side, difficulty lifting the left leg.  No changes in bowels or bladder, has urinary urgency, on Myrbetriq.  He takes the bus, took [employer] bus today.  He lives alone, does his household activities.  He can stand and walk short distances if he can hold onto to something. Recent fall,  abrasion to left upper arm.  Seen podiatry for right foot ulcer. He presents today unaccompanied.   REVIEW OF SYSTEMS: Out of a complete 14 system review of symptoms, the patient complains only of the following symptoms, and all other reviewed systems are negative.  Walking difficulty, double vision  ALLERGIES: Allergies  Allergen Reactions  . Peanut-Containing Drug Products Shortness Of Breath  . Zanaflex [Tizanidine Hcl] Shortness Of Breath    HOME MEDICATIONS: Outpatient Medications Prior to Visit  Medication Sig Dispense Refill  . baclofen (LIORESAL) 20 MG tablet TAKE 1 TABLET BY MOUTH IN THE MORNING, 1 TABLET AT NOON, AND 2 TABLETS AT NIGHT 120 tablet 11  . hydrochlorothiazide (MICROZIDE) 12.5 MG capsule TAKE 1 CAPSULE(12.5 MG) BY MOUTH DAILY 90 capsule 0  . MYRBETRIQ 50 MG TB24 tablet     . tamsulosin (FLOMAX) 0.4 MG CAPS capsule Take 1 capsule (0.4 mg total) by mouth daily. 30 capsule 0  . TECFIDERA 240 MG CPDR Take 1 capsule by mouth 2 (two) times daily.    . Armodafinil 150 MG tablet Take 1 tablet (150 mg total) by mouth daily. 90 tablet 1  . clotrimazole (LOTRIMIN) 1 % cream Apply 1 application topically 2 (two) times daily. To any wet areas between toes for 1 week. 30 g 0   Facility-Administered Medications Prior to Visit  Medication Dose Route Frequency Provider Last Rate Last Admin  . gadopentetate dimeglumine (MAGNEVIST) injection 20 mL  20 mL Intravenous Once  PRN York Spaniel, MD        PAST MEDICAL HISTORY: Past Medical History:  Diagnosis Date  . Abnormality of gait 04/22/2013  . Asthma   . BPH (benign prostatic hyperplasia)   . Hypertension   . Multiple sclerosis (HCC)   . 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    PAST SURGICAL HISTORY: Past Surgical History:  Procedure Laterality Date  . none      FAMILY HISTORY: Family History  Problem Relation Age of Onset  . Cancer Mother        breast  . Stroke Father   . Cancer - Colon Father     . Multiple sclerosis Neg Hx     SOCIAL HISTORY: Social History   Socioeconomic History  . Marital status: Divorced    Spouse name: n/a  . Number of children: 3  . Years of education: 90  . Highest education level: Not on file  Occupational History  . Occupation: disabled/retired 1999-[place]    Comment: Event organiser  Tobacco Use  . Smoking status: Never Smoker  . Smokeless tobacco: Never Used  Substance and Sexual Activity  . Alcohol use: Yes    Alcohol/week: 0.0 standard drinks    Comment: occasionally wine cooler  . Drug use: No  . Sexual activity: Never  Other Topics Concern  . Not on file  Social History Narrative   Lives alone, house.  3 children.  Caffeine none.  Green Tea once week.     Social Determinants of Health   Financial Resource Strain:   . Difficulty of Paying Living Expenses: Not on file  Food Insecurity:   . Worried About Programme researcher, broadcasting/film/video in the Last Year: Not on file  . Ran Out of Food in the Last Year: Not on file  Transportation Needs:   . Lack of Transportation (Medical): Not on file  . Lack of Transportation (Non-Medical): Not on file  Physical Activity:   . Days of Exercise per Week: Not on file  . Minutes of Exercise per Session: Not on file  Stress:   . Feeling of Stress : Not on file  Social Connections:   . Frequency of Communication with Friends and Family: Not on file  . Frequency of Social Gatherings with Friends and Family: Not on file  . Attends Religious Services: Not on file  . Active Member of Clubs or Organizations: Not on file  . Attends Banker Meetings: Not on file  . Marital Status: Not on file  Intimate Partner Violence:   . Fear of Current or Ex-Partner: Not on file  . Emotionally Abused: Not on file  . Physically Abused: Not on file  . Sexually Abused: Not on file   PHYSICAL EXAM  Vitals:   02/05/20 1054  BP: 120/72  Pulse: 68   There is no height or weight on file to calculate  BMI. Generalized: Well developed, in no acute distress  Neurological examination  Mentation: Alert oriented to time, place, history taking. Follows all commands speech and language fluent, very pleasant Cranial nerve II-XII: Pupils were equal round reactive to light.  Left INO.  Facial sensation and strength were normal. Head turning and shoulder shrug  were normal and symmetric. Motor: Good strength of upper extremities, and right lower, 3/5 with left lower, increased spasticity left lower Sensory: Sensory testing is intact to soft touch on all 4 extremities. No evidence of extinction is noted.  Coordination: Mild to moderate dysmetria with left  finger-nose-finger, cannot perform heel-to-shin with the left Gait and station: Was not ambulated, is in a motor wheelchair Reflexes: Deep tendon reflexes are symmetric are brisk throughout  DIAGNOSTIC DATA (LABS, IMAGING, TESTING) - I reviewed patient records, labs, notes, testing and imaging myself where available.  Lab Results  Component Value Date   WBC 6.6 08/06/2019   HGB 14.2 08/06/2019   HCT 41.6 08/06/2019   MCV 84 08/06/2019   PLT 229 08/06/2019      Component Value Date/Time   NA 143 10/27/2019 1536   K 4.0 10/27/2019 1536   CL 106 10/27/2019 1536   CO2 25 10/27/2019 1536   GLUCOSE 104 (H) 10/27/2019 1536   GLUCOSE 96 07/30/2013 0952   BUN 18 10/27/2019 1536   CREATININE 0.89 10/27/2019 1536   CREATININE 0.89 07/30/2013 0952   CALCIUM 9.8 10/27/2019 1536   PROT 7.2 08/06/2019 1133   ALBUMIN 4.5 08/06/2019 1133   AST 18 08/06/2019 1133   ALT 26 08/06/2019 1133   ALKPHOS 81 08/06/2019 1133   BILITOT 0.4 08/06/2019 1133   GFRNONAA 94 10/27/2019 1536   GFRNONAA >89 07/30/2013 0952   GFRAA 108 10/27/2019 1536   GFRAA >89 07/30/2013 0952   Lab Results  Component Value Date   CHOL 233 (H) 01/01/2019   HDL 67 01/01/2019   LDLCALC 149 (H) 01/01/2019   TRIG 98 01/01/2019   CHOLHDL 3.5 01/01/2019   Lab Results  Component  Value Date   HGBA1C 5.4 10/27/2019   Lab Results  Component Value Date   VITAMINB12 343 07/06/2017   Lab Results  Component Value Date   TSH 3.570 07/06/2017   ASSESSMENT AND PLAN 59 y.o. year old male  has a past medical history of Abnormality of gait (04/22/2013), Asthma, BPH (benign prostatic hyperplasia), Hypertension, Multiple sclerosis (HCC), and Quadriplegia and quadriparesis (HCC) (03/08/2016). here with:  1. Multiple sclerosis 2. Quadriparesis, left-sided weakness 3. Gait disorder 4.  Left INO, double vision -Overall stable, really benefiting from PT -Encouraged to wear left knee brace for stability, no falls recent -MRI of the brain and cervical spine in May 2021 were overall stable -Increase spasticity noted to left leg, makes PT difficult, retry tizanidine (chart reported allergy SOB, patient does not remember, was mild, would like to retry), we talked about risk, try tizanidine 2 mg to start, up to 3 times daily if tolerated, have Benadryl on hand if SOB occurs -Continue armodafinil 150 mg daily for fatigue (last fill 12/01/19 # 30) -Continue baclofen 20 mg, 1 in the morning, 1 at noon, 2 at bedtime -Continue Tecfidera 240 mg twice daily -Try OTC melatonin 1 or 2 mg to help with sleeping -Follow-up in 6 months or sooner if needed, would like for him to check in with Dr. Anne Hahn at next visit  I spent 30 minutes of face-to-face and non-face-to-face time with patient.  This included previsit chart review, lab review, study review, order entry, electronic health record documentation, patient education.  Margie Ege, AGNP-C, DNP 02/05/2020, 11:50 AM [facility] [address], [address] [place], [place] [address] [phone]

## 2020-02-06 LAB — CBC WITH DIFFERENTIAL/PLATELET
Basophils Absolute: 0.1 10*3/uL (ref 0.0–0.2)
Basos: 1 %
EOS (ABSOLUTE): 0.4 10*3/uL (ref 0.0–0.4)
Eos: 8 %
Hematocrit: 41.2 % (ref 37.5–51.0)
Hemoglobin: 13.1 g/dL (ref 13.0–17.7)
Immature Grans (Abs): 0 10*3/uL (ref 0.0–0.1)
Immature Granulocytes: 0 %
Lymphocytes Absolute: 1.8 10*3/uL (ref 0.7–3.1)
Lymphs: 33 %
MCH: 26.7 pg (ref 26.6–33.0)
MCHC: 31.8 g/dL (ref 31.5–35.7)
MCV: 84 fL (ref 79–97)
Monocytes Absolute: 0.5 10*3/uL (ref 0.1–0.9)
Monocytes: 10 %
Neutrophils Absolute: 2.6 10*3/uL (ref 1.4–7.0)
Neutrophils: 48 %
Platelets: 216 10*3/uL (ref 150–450)
RBC: 4.91 x10E6/uL (ref 4.14–5.80)
RDW: 12.5 % (ref 11.6–15.4)
WBC: 5.4 10*3/uL (ref 3.4–10.8)

## 2020-02-06 LAB — COMPREHENSIVE METABOLIC PANEL
ALT: 24 IU/L (ref 0–44)
AST: 18 IU/L (ref 0–40)
Albumin/Globulin Ratio: 1.5 (ref 1.2–2.2)
Albumin: 4.4 g/dL (ref 3.8–4.9)
Alkaline Phosphatase: 78 IU/L (ref 44–121)
BUN/Creatinine Ratio: 18 (ref 10–24)
BUN: 16 mg/dL (ref 8–27)
Bilirubin Total: 0.4 mg/dL (ref 0.0–1.2)
CO2: 27 mmol/L (ref 20–29)
Calcium: 9.6 mg/dL (ref 8.6–10.2)
Chloride: 104 mmol/L (ref 96–106)
Creatinine, Ser: 0.89 mg/dL (ref 0.76–1.27)
GFR calc Af Amer: 107 mL/min/{1.73_m2} (ref >59)
GFR calc non Af Amer: 93 mL/min/{1.73_m2} (ref >59)
Globulin, Total: 3 g/dL (ref 1.5–4.5)
Glucose: 105 mg/dL — ABNORMAL HIGH (ref 65–99)
Potassium: 4.1 mmol/L (ref 3.5–5.2)
Sodium: 144 mmol/L (ref 134–144)
Total Protein: 7.4 g/dL (ref 6.0–8.5)

## 2020-02-06 NOTE — Progress Notes (Signed)
I have read the note, and I agree with the clinical assessment and plan.  Jerl K Sennie Borden   

## 2020-02-11 ENCOUNTER — Other Ambulatory Visit: Payer: Self-pay

## 2020-02-11 ENCOUNTER — Ambulatory Visit: Payer: 59 | Admitting: Physical Therapy

## 2020-02-11 DIAGNOSIS — R293 Abnormal posture: Secondary | ICD-10-CM | POA: Diagnosis not present

## 2020-02-11 DIAGNOSIS — M6281 Muscle weakness (generalized): Secondary | ICD-10-CM

## 2020-02-11 DIAGNOSIS — R262 Difficulty in walking, not elsewhere classified: Secondary | ICD-10-CM

## 2020-02-11 DIAGNOSIS — R2681 Unsteadiness on feet: Secondary | ICD-10-CM

## 2020-02-11 DIAGNOSIS — G35 Multiple sclerosis: Secondary | ICD-10-CM

## 2020-02-11 DIAGNOSIS — R2689 Other abnormalities of gait and mobility: Secondary | ICD-10-CM

## 2020-02-11 NOTE — Therapy (Signed)
[facility] [address] [place], [place], [address] Phone: [phone]   Fax:  [phone]  Physical Therapy Treatment  Patient Details  Name: Randy Shaw MRN: [id number] Date of Birth: 1960/12/05 Referring Provider (PT): Margie Ege, NP   Encounter Date: 02/11/2020   PT End of Session - 02/11/20 1449    Visit Number 18    Number of Visits 25    Date for PT Re-Evaluation 03/22/20    Authorization Type Eval 09/12/19, Recert 10/07/19, Recert 12/15/19 (1x wk/12 sessions); recert 01/26/20    Progress Note Due on Visit 23    PT Start Time 1440    PT Stop Time 1520    PT Time Calculation (min) 40 min    Equipment Utilized During Treatment Gait belt    Activity Tolerance Patient tolerated treatment well;Patient limited by fatigue    Behavior During Therapy [facility] for tasks assessed/performed           Past Medical History:  Diagnosis Date  . Abnormality of gait 04/22/2013  . Asthma   . BPH (benign prostatic hyperplasia)   . Hypertension   . Multiple sclerosis (HCC)   . 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 none      There were no vitals filed for this visit.   Subjective Assessment - 02/11/20 1647    Subjective Pt reports no new issues. Pt has been working on his heel slides at home. He admits he does not attempt to stand as much as he should at home.    Pertinent History BPH, hx of falls, MS, HTN    How long can you sit comfortably? no limits    How long can you stand comfortably? 2 min    How long can you walk comfortably? 5 steps with holding on to furniture    Patient Stated Goals Work on strengthening              [facility] PT Assessment - 02/11/20 0001      Timed Up and Go Test   Normal TUG (seconds) --   2 min 31.5 sec; wide and slow turn             Standing:   weight shifting L<>R x 10  alternating UE reach over head x10  bilat with sec hold/stretch  weight shift forward/backward x10,   Foot slide forward/back x10 bilat Amb 20'x2 RW with shoe cover on L foot Turning x6 with cueing for improved tight turns/transfers                     PT Short Term Goals - 01/26/20 1906      PT SHORT TERM GOAL #1   Title Patient will be able to ambulate 13' with rolling walker to improve household ambulation.    Baseline 40' max with rollator    Time 4    Period Weeks    Status Revised    Target Date 02/23/20             PT Long Term Goals - 02/11/20 1640      PT LONG TERM GOAL #1   Title Pt will improve TUG score to </= 1 min and 30 sec for improved home mobility    Baseline 2 min 31.5 sec (02/11/20) with shoe cover over L foot to assist with sliding foot forward    Time 8  Period Weeks    Status Revised      PT LONG TERM GOAL #2   Title Patient will demo 0.2m/s improvement in 10 m/s with rolling walker to improve household ambulation    Baseline TBD    Time 8    Period Weeks    Status Revised                 Plan - 02/11/20 1638    Clinical Impression Statement Treatment focused on improving gait, endurance, and turning for improved pt safety at home. Pt able to slide L foot forward using shoe cover; however, has difficulty progressing it with his shoe rubber against the tile. Pt demos decreased ability to advance L LE with fatigue. Pt still not obtaining enough L hip or knee initiation for improved foot clearance. Pt needs cueing to advance RW, L LE, and then R LE.    Personal Factors and Comorbidities Comorbidity 2;Fitness;Time since onset of injury/illness/exacerbation;Transportation    Comorbidities hx of falls, HTN, MS    Examination-Activity Limitations Bed Mobility;Carry;Locomotion Level;Dressing;Stairs;Stand;Transfers;Squat    Examination-Participation Restrictions Cleaning;Community Activity;Meal Prep;Laundry;Shop    Stability/Clinical Decision Making  Unstable/Unpredictable    Rehab Potential Fair    PT Frequency 1x / week    PT Duration 8 weeks    PT Treatment/Interventions ADLs/Self Care Home Management;Gait training;Stair training;Functional mobility training;Therapeutic activities;Therapeutic exercise;Balance training;Wheelchair mobility training;Passive range of motion;Orthotic Fit/Training;Energy conservation;Joint Manipulations;Manual techniques;Electrical Stimulation    PT Next Visit Plan Working on standing, pre-gait, and gait activities as able, and safe transfers. Working on improving L foot clearance    PT Home Exercise Plan Access Code: [email or web address]; heel slide, seated clam shell, knee to chest and lower trunk rotations    Consulted and Agree with Plan of Care Patient           Patient will benefit from skilled therapeutic intervention in order to improve the following deficits and impairments:  Abnormal gait, Decreased activity tolerance, Decreased balance, Decreased endurance, Decreased safety awareness, Decreased range of motion, Decreased mobility, Decreased strength, Difficulty walking, Increased edema, Impaired flexibility, Impaired tone, Impaired vision/preception  Visit Diagnosis: Abnormal posture  Difficulty in walking, not elsewhere classified  Muscle weakness (generalized)  Other abnormalities of gait and mobility  Multiple sclerosis (HCC)  Unsteadiness on feet     Problem List Patient Active Problem List   Diagnosis Date Noted  . 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  . Abnormality of gait 04/22/2013  . HTN (hypertension) 06/29/2012  . Multiple sclerosis (HCC) 06/29/2012  . Benign prostatic hyperplasia with urinary obstruction 06/29/2012    [facility] April Ma L Tali Coster PT, DPT 02/11/2020, 4:47 PM  [facility] [address] [place], [place], [address] Phone: [phone]   Fax:  [phone]  Name: Randy Shaw MRN:  [id number] Date of Birth: 08-Apr-1961

## 2020-02-18 ENCOUNTER — Ambulatory Visit: Payer: 59 | Admitting: Physical Therapy

## 2020-02-18 ENCOUNTER — Other Ambulatory Visit: Payer: Self-pay

## 2020-02-18 DIAGNOSIS — M6281 Muscle weakness (generalized): Secondary | ICD-10-CM

## 2020-02-18 DIAGNOSIS — R293 Abnormal posture: Secondary | ICD-10-CM | POA: Diagnosis not present

## 2020-02-18 DIAGNOSIS — G35 Multiple sclerosis: Secondary | ICD-10-CM

## 2020-02-18 DIAGNOSIS — R262 Difficulty in walking, not elsewhere classified: Secondary | ICD-10-CM

## 2020-02-18 DIAGNOSIS — R2689 Other abnormalities of gait and mobility: Secondary | ICD-10-CM

## 2020-02-18 DIAGNOSIS — R2681 Unsteadiness on feet: Secondary | ICD-10-CM

## 2020-02-18 NOTE — Therapy (Signed)
[facility] [address] [place], [place], [address] Phone: [phone]   Fax:  [phone]  Physical Therapy Treatment  Patient Details  Name: Randy Shaw MRN: [id number] Date of Birth: 02-12-1961 Referring Provider (PT): Margie Ege, NP   Encounter Date: 02/18/2020   PT End of Session - 02/18/20 1536    Visit Number 19    Number of Visits 25    Date for PT Re-Evaluation 03/22/20    Authorization Type Eval 09/12/19, Recert 10/07/19, Recert 12/15/19 (1x wk/12 sessions); recert 01/26/20    Progress Note Due on Visit 23    PT Start Time 1447    PT Stop Time 1535    PT Time Calculation (min) 48 min    Equipment Utilized During Treatment Gait belt    Activity Tolerance Patient tolerated treatment well;Patient limited by fatigue    Behavior During Therapy [facility] for tasks assessed/performed           Past Medical History:  Diagnosis Date  . Abnormality of gait 04/22/2013  . Asthma   . BPH (benign prostatic hyperplasia)   . Hypertension   . Multiple sclerosis (HCC)   . 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 none      There were no vitals filed for this visit.   Subjective Assessment - 02/18/20 1725    Subjective Pt states he has been practicing standing a little bit at home. No other new issues to report.    Pertinent History BPH, hx of falls, MS, HTN    How long can you sit comfortably? no limits    How long can you stand comfortably? 2 min    How long can you walk comfortably? 5 steps with holding on to furniture    Patient Stated Goals Work on strengthening             Seated:  SLR 5x5  Standing:  SLR 2x5  Alternating UE reach x10 bilat  Weight shift L<>R x10  Gait:  Amb ~40' RW CGA/min A at times to assist with progressing L LE. Focus on pt utilizing hip flexors to advance L foot. Cues for sequencing. Toe-up brace used on L foot  to decrease toe drag. 3 sitting rest breaks due to hip flexion fatigue.                           PT Short Term Goals - 01/26/20 1906      PT SHORT TERM GOAL #1   Title Patient will be able to ambulate 34' with rolling walker to improve household ambulation.    Baseline 40' max with rollator    Time 4    Period Weeks    Status Revised    Target Date 02/23/20             PT Long Term Goals - 02/11/20 1640      PT LONG TERM GOAL #1   Title Pt will improve TUG score to </= 1 min and 30 sec for improved home mobility    Baseline 2 min 31.5 sec (02/11/20) with shoe cover over L foot to assist with sliding foot forward    Time 8    Period Weeks    Status Revised      PT LONG TERM GOAL #2   Title Patient will demo 0.61m/s improvement in 10 m/s with rolling walker  to improve household ambulation    Baseline TBD    Time 8    Period Weeks    Status Revised                 Plan - 02/18/20 1726    Clinical Impression Statement Treatment session focused heavily on gait, endurance, and strengthening. Pt able to progress L foot without shoe cover; however, he is unable to perform this consistently. This was improved with use of toe-up brace. Pt able to amb short distance today with less foot drag/sliding. Pt limited due to fatigue.    Personal Factors and Comorbidities Comorbidity 2;Fitness;Time since onset of injury/illness/exacerbation;Transportation    Comorbidities hx of falls, HTN, MS    Examination-Activity Limitations Bed Mobility;Carry;Locomotion Level;Dressing;Stairs;Stand;Transfers;Squat    Examination-Participation Restrictions Cleaning;Community Activity;Meal Prep;Laundry;Shop    Stability/Clinical Decision Making Unstable/Unpredictable    Rehab Potential Fair    PT Frequency 1x / week    PT Duration 8 weeks    PT Treatment/Interventions ADLs/Self Care Home Management;Gait training;Stair training;Functional mobility training;Therapeutic  activities;Therapeutic exercise;Balance training;Wheelchair mobility training;Passive range of motion;Orthotic Fit/Training;Energy conservation;Joint Manipulations;Manual techniques;Electrical Stimulation    PT Next Visit Plan Working on standing, pre-gait, and gait activities as able, and safe transfers. Working on improving L foot clearance in standing    PT Home Exercise Plan seated clam shell, heel slide, SLR in seated, SLR in standing    Consulted and Agree with Plan of Care Patient           Patient will benefit from skilled therapeutic intervention in order to improve the following deficits and impairments:  Abnormal gait, Decreased activity tolerance, Decreased balance, Decreased endurance, Decreased safety awareness, Decreased range of motion, Decreased mobility, Decreased strength, Difficulty walking, Increased edema, Impaired flexibility, Impaired tone, Impaired vision/preception  Visit Diagnosis: Abnormal posture  Difficulty in walking, not elsewhere classified  Muscle weakness (generalized)  Other abnormalities of gait and mobility  Multiple sclerosis (HCC)  Unsteadiness on feet     Problem List Patient Active Problem List   Diagnosis Date Noted  . 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  . Abnormality of gait 04/22/2013  . HTN (hypertension) 06/29/2012  . Multiple sclerosis (HCC) 06/29/2012  . Benign prostatic hyperplasia with urinary obstruction 06/29/2012    Deep Bonawitz April Ma L Shaquavia Whisonant PT, DPT 02/18/2020, 5:31 PM  [facility] [address] [place], [place], [address] Phone: [phone]   Fax:  [phone]  Name: Randy Shaw MRN: [id number] Date of Birth: 10-04-1960

## 2020-02-27 ENCOUNTER — Other Ambulatory Visit: Payer: Self-pay

## 2020-02-27 ENCOUNTER — Ambulatory Visit: Payer: 59

## 2020-02-27 DIAGNOSIS — R2681 Unsteadiness on feet: Secondary | ICD-10-CM

## 2020-02-27 DIAGNOSIS — M6281 Muscle weakness (generalized): Secondary | ICD-10-CM

## 2020-02-27 DIAGNOSIS — R2689 Other abnormalities of gait and mobility: Secondary | ICD-10-CM

## 2020-02-27 DIAGNOSIS — R262 Difficulty in walking, not elsewhere classified: Secondary | ICD-10-CM

## 2020-02-27 DIAGNOSIS — R293 Abnormal posture: Secondary | ICD-10-CM | POA: Diagnosis not present

## 2020-02-27 DIAGNOSIS — G35 Multiple sclerosis: Secondary | ICD-10-CM

## 2020-02-27 NOTE — Therapy (Signed)
[facility] [address] [place], [place], [address] Phone: [phone]   Fax:  [phone]  Physical Therapy Treatment  Patient Details  Name: Randy Shaw MRN: [id number] Date of Birth: [birth year] Referring Provider (PT): Margie Ege, NP   Encounter Date: 02/27/2020   PT End of Session - 02/27/20 1632    Visit Number 20    Number of Visits 25    Date for PT Re-Evaluation 03/22/20    Authorization Type Eval 09/12/19, Recert 10/07/19, Recert 12/15/19 (1x wk/12 sessions); recert 01/26/20    Progress Note Due on Visit 23    PT Start Time 1530    PT Stop Time 1615    PT Time Calculation (min) 45 min    Equipment Utilized During Treatment Gait belt    Activity Tolerance Patient tolerated treatment well;Patient limited by fatigue    Behavior During Therapy [facility] for tasks assessed/performed           Past Medical History:  Diagnosis Date  . Abnormality of gait 04/22/2013  . Asthma   . BPH (benign prostatic hyperplasia)   . Hypertension   . Multiple sclerosis (HCC)   . 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 none      There were no vitals filed for this visit.   Subjective Assessment - 02/27/20 1540    Subjective Doing okay. No new falls. Found his AFO and today is the first day that he is wearing it.                  Seated manually stretched L heel cords Seated manually resisted hip abduction and adduction: 20x bil Seated LAQ: 20x L only Seated hamstring curs: no fasciilitation in hamstring noted  Gait training: 3 x 30 feet with 2 WW CGA and occaisonal min A to advaance L LE forward, cues required for longer L step length and shorter R step length                     PT Short Term Goals - 01/26/20 1906      PT SHORT TERM GOAL #1   Title Patient will be able to ambulate 61' with rolling walker to improve household  ambulation.    Baseline 40' max with rollator    Time 4    Period Weeks    Status Revised    Target Date 02/23/20             PT Long Term Goals - 02/11/20 1640      PT LONG TERM GOAL #1   Title Pt will improve TUG score to </= 1 min and 30 sec for improved home mobility    Baseline 2 min 31.5 sec (02/11/20) with shoe cover over L foot to assist with sliding foot forward    Time 8    Period Weeks    Status Revised      PT LONG TERM GOAL #2   Title Patient will demo 0.63m/s improvement in 10 m/s with rolling walker to improve household ambulation    Baseline TBD    Time 8    Period Weeks    Status Revised                 Plan - 02/27/20 1629    Clinical Impression Statement pt demo improved ability to clear his L foot with wearing of L AFO and L  Swedish knee cage. Patient able to ambulate with 3 x 30 feet with mostly CGA and occiasonal min A to advance L LE forwrd. Pt was reporting disocmfort in anteriro shin from the strap. Padding was missing on the strap so foam padding was placed on strap to cushion and pt reported improved comfort with that. Patient was educated to start with one hour of wear time and gadually progress while observing for any skin break downs.    Personal Factors and Comorbidities Comorbidity 2;Fitness;Time since onset of injury/illness/exacerbation;Transportation    Comorbidities hx of falls, HTN, MS    Examination-Activity Limitations Bed Mobility;Carry;Locomotion Level;Dressing;Stairs;Stand;Transfers;Squat    Examination-Participation Restrictions Cleaning;Community Activity;Meal Prep;Laundry;Shop    Stability/Clinical Decision Making Unstable/Unpredictable    Rehab Potential Fair    PT Frequency 1x / week    PT Duration 8 weeks    PT Treatment/Interventions ADLs/Self Care Home Management;Gait training;Stair training;Functional mobility training;Therapeutic activities;Therapeutic exercise;Balance training;Wheelchair mobility training;Passive  range of motion;Orthotic Fit/Training;Energy conservation;Joint Manipulations;Manual techniques;Electrical Stimulation    PT Next Visit Plan Continue to utilize AFO and swedish knee cage. Work on step to pattern with longer L step length to improve L hip flexor fascilitation and shoulder R leg step length where pt brings R foot to L foot    PT Home Exercise Plan seated clam shell, heel slide, SLR in seated, SLR in standing    Consulted and Agree with Plan of Care Patient           Patient will benefit from skilled therapeutic intervention in order to improve the following deficits and impairments:  Abnormal gait, Decreased activity tolerance, Decreased balance, Decreased endurance, Decreased safety awareness, Decreased range of motion, Decreased mobility, Decreased strength, Difficulty walking, Increased edema, Impaired flexibility, Impaired tone, Impaired vision/preception  Visit Diagnosis: Abnormal posture  Difficulty in walking, not elsewhere classified  Muscle weakness (generalized)  Other abnormalities of gait and mobility  Multiple sclerosis (HCC)  Unsteadiness on feet     Problem List Patient Active Problem List   Diagnosis Date Noted  . 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  . Abnormality of gait 04/22/2013  . HTN (hypertension) 06/29/2012  . Multiple sclerosis (HCC) 06/29/2012  . Benign prostatic hyperplasia with urinary obstruction 06/29/2012    [name] 02/27/2020, 4:33 PM  [facility] [address] [place], [place], [address] Phone: [phone]   Fax:  [phone]  Name: Randy Shaw MRN: [id number] Date of Birth: 1960/10/09

## 2020-03-03 ENCOUNTER — Encounter: Payer: Self-pay | Admitting: Physical Therapy

## 2020-03-03 ENCOUNTER — Ambulatory Visit: Payer: 59 | Attending: Neurology | Admitting: Physical Therapy

## 2020-03-03 ENCOUNTER — Other Ambulatory Visit: Payer: Self-pay

## 2020-03-03 DIAGNOSIS — M6281 Muscle weakness (generalized): Secondary | ICD-10-CM | POA: Insufficient documentation

## 2020-03-03 DIAGNOSIS — R262 Difficulty in walking, not elsewhere classified: Secondary | ICD-10-CM | POA: Insufficient documentation

## 2020-03-03 DIAGNOSIS — G35 Multiple sclerosis: Secondary | ICD-10-CM | POA: Diagnosis present

## 2020-03-03 DIAGNOSIS — R2681 Unsteadiness on feet: Secondary | ICD-10-CM | POA: Diagnosis present

## 2020-03-03 DIAGNOSIS — R293 Abnormal posture: Secondary | ICD-10-CM | POA: Insufficient documentation

## 2020-03-03 DIAGNOSIS — R2689 Other abnormalities of gait and mobility: Secondary | ICD-10-CM | POA: Diagnosis present

## 2020-03-03 NOTE — Therapy (Signed)
[facility] [address] [place], [place], [address] Phone: [phone]   Fax:  [phone]  Physical Therapy Treatment  Patient Details  Name: Randy Shaw MRN: [id number] Date of Birth: [birth year] Referring Provider (PT): Margie Ege, NP   Encounter Date: 03/03/2020   PT End of Session - 03/03/20 1718    Visit Number 21    Number of Visits 25    Date for PT Re-Evaluation 03/22/20    Authorization Type Eval 09/12/19, Recert 10/07/19, Recert 12/15/19 (1x wk/12 sessions); recert 01/26/20    Progress Note Due on Visit 23    PT Start Time 1445    PT Stop Time 1530    PT Time Calculation (min) 45 min    Equipment Utilized During Treatment Gait belt    Activity Tolerance Patient tolerated treatment well;Patient limited by fatigue    Behavior During Therapy [facility] for tasks assessed/performed           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    Past Surgical History:  Procedure Laterality Date   none      There were no vitals filed for this visit.   Subjective Assessment - 03/03/20 1453    Subjective Pt reports he found his AFO and it had worked really well. Pt states he's been increasing his standing time and estimates he's been standing for a total of 10 minutes.    Pertinent History BPH, hx of falls, MS, HTN    How long can you sit comfortably? no limits    How long can you stand comfortably? 2 min    How long can you walk comfortably? 5 steps with holding on to furniture    Patient Stated Goals Work on strengthening    Currently in Pain? No/denies                 Ambulated 115' total CGA with RW swedish knee cage and AFO on L LE. 50' with no rest break, 25'x2 with obstacle negotiation  going around cones and working on  turning with seated rest break in between. 15' to return to power chair at end of session but required 1 sitting rest break due to fatigue. Cues to keep L LE from dragging too far behind. Turning x 6 reps with RW and min v/cs for foot placement and sequencing On bottom of ramp: Work on step forward and backward bilat 2x10 with RW; min A at times to stabilize RW.                      PT Education - 03/03/20 1717    Education Details Discussed increased standing time to improve his muscle endurance    Person(s) Educated Patient    Methods Explanation    Comprehension Verbalized understanding            PT Short Term Goals - 01/26/20 1906      PT SHORT TERM GOAL #1   Title Patient will be able to ambulate 54' with rolling walker to improve household ambulation.    Baseline 40' max with rollator    Time 4    Period Weeks    Status Revised    Target Date 02/23/20  PT Long Term Goals - 02/11/20 1640      PT LONG TERM GOAL #1   Title Pt will improve TUG score to </= 1 min and 30 sec for improved home mobility    Baseline 2 min 31.5 sec (02/11/20) with shoe cover over L foot to assist with sliding foot forward    Time 8    Period Weeks    Status Revised      PT LONG TERM GOAL #2   Title Patient will demo 0.40m/s improvement in 10 m/s with rolling walker to improve household ambulation    Baseline TBD    Time 8    Period Weeks    Status Revised                 Plan - 03/03/20 1515    Clinical Impression Statement Treatment focused on continuing to improve endurance with gait. Pt with improved foot clearance with AFO and able to demonstrate increased gait speed and obstacle negotiation. Worked on stepping on/off bottom of incline ramp to further challenge foot clearance with stepping.    Personal Factors and Comorbidities Comorbidity 2;Fitness;Time since onset of injury/illness/exacerbation;Transportation    Comorbidities hx of falls, HTN,  MS    Examination-Activity Limitations Bed Mobility;Carry;Locomotion Level;Dressing;Stairs;Stand;Transfers;Squat    Examination-Participation Restrictions Cleaning;Community Activity;Meal Prep;Laundry;Shop    Stability/Clinical Decision Making Unstable/Unpredictable    Rehab Potential Fair    PT Frequency 1x / week    PT Duration 8 weeks    PT Treatment/Interventions ADLs/Self Care Home Management;Gait training;Stair training;Functional mobility training;Therapeutic activities;Therapeutic exercise;Balance training;Wheelchair mobility training;Passive range of motion;Orthotic Fit/Training;Energy conservation;Joint Manipulations;Manual techniques;Electrical Stimulation    PT Next Visit Plan Continue to utilize AFO and swedish knee cage. Work on step to pattern with longer L step length to improve L hip flexor fascilitation and shoulder R leg step length where pt brings R foot to L foot    PT Home Exercise Plan seated clam shell, heel slide, SLR in seated, SLR in standing    Consulted and Agree with Plan of Care Patient           Patient will benefit from skilled therapeutic intervention in order to improve the following deficits and impairments:  Abnormal gait, Decreased activity tolerance, Decreased balance, Decreased endurance, Decreased safety awareness, Decreased range of motion, Decreased mobility, Decreased strength, Difficulty walking, Increased edema, Impaired flexibility, Impaired tone, Impaired vision/preception  Visit Diagnosis: Abnormal posture  Difficulty in walking, not elsewhere classified  Muscle weakness (generalized)  Other abnormalities of gait and mobility  Multiple sclerosis (HCC)  Unsteadiness on feet     Problem List Patient Active Problem List   Diagnosis Date Noted   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   Abnormality of gait 04/22/2013   HTN (hypertension) 06/29/2012   Multiple sclerosis (HCC) 06/29/2012   Benign prostatic hyperplasia  with urinary obstruction 06/29/2012    Randy Shaw PT, DPT 03/03/2020, 5:19 PM  [facility] [address] [place], [place], [address] Phone: [phone]   Fax:  [phone]  Name: Randy Shaw MRN: [id number] Date of Birth: 03-Aug-1960

## 2020-03-07 ENCOUNTER — Encounter: Payer: Self-pay | Admitting: Cardiology

## 2020-03-10 ENCOUNTER — Ambulatory Visit: Payer: 59 | Admitting: Physical Therapy

## 2020-03-17 ENCOUNTER — Ambulatory Visit: Payer: 59 | Admitting: Physical Therapy

## 2020-03-17 ENCOUNTER — Other Ambulatory Visit: Payer: Self-pay

## 2020-03-17 ENCOUNTER — Encounter: Payer: Self-pay | Admitting: Physical Therapy

## 2020-03-17 DIAGNOSIS — R2689 Other abnormalities of gait and mobility: Secondary | ICD-10-CM

## 2020-03-17 DIAGNOSIS — R293 Abnormal posture: Secondary | ICD-10-CM

## 2020-03-17 DIAGNOSIS — R2681 Unsteadiness on feet: Secondary | ICD-10-CM

## 2020-03-17 DIAGNOSIS — G35 Multiple sclerosis: Secondary | ICD-10-CM

## 2020-03-17 DIAGNOSIS — R262 Difficulty in walking, not elsewhere classified: Secondary | ICD-10-CM

## 2020-03-17 DIAGNOSIS — M6281 Muscle weakness (generalized): Secondary | ICD-10-CM

## 2020-03-17 NOTE — Therapy (Signed)
[facility] [address] [place], [place], [address] Phone: [phone]   Fax:  [phone]  Physical Therapy Treatment and Re-Certification  Patient Details  Name: Randy Shaw MRN: [id number] Date of Birth: 10/24/1960 Referring Provider (PT): Sarah Slack, NP   Encounter Date: 03/17/2020   PT End of Session - 03/17/20 1533    Visit Number 22    Number of Visits 29    Date for PT Re-Evaluation 04/28/20    Authorization Type Eval 09/12/19, Recert 10/07/19, Recert 12/15/19 (1x wk/12 sessions); recert 01/26/20; recert 03/17/20    Progress Note Due on Visit 23    PT Start Time 1445    PT Stop Time 1530    PT Time Calculation (min) 45 min    Equipment Utilized During Treatment Gait belt    Activity Tolerance Patient tolerated treatment well;Patient limited by fatigue    Behavior During Therapy WFL for tasks assessed/performed           Past Medical History:  Diagnosis Date  . Abnormality of gait 04/22/2013  . Asthma   . BPH (benign prostatic hyperplasia)   . Hypertension   . Multiple sclerosis (HCC)   . 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 none      There were no vitals filed for this visit.   Subjective Assessment - 03/17/20 1449    Subjective Pt states he was cleaning the bathroom, missed his chair and went down on to his knees and could not get back up. He states he was not in peril but was just stuck. Pt has been working on standing at home.    Pertinent History BPH, hx of falls, MS, HTN    How long can you sit comfortably? no limits    How long can you stand comfortably? 2 min    How long can you walk comfortably? 5 steps with holding on to furniture    Patient Stated Goals Work on strengthening; walking with the walker    Currently in Pain? No/denies              [facility] PT Assessment - 03/17/20 0001      Ambulation/Gait    Ambulation/Gait Assistance 4: Min guard;5: Supervision    Ambulation Distance (Feet) 100 Feet    Assistive device Rolling walker    Gait Pattern Step-through pattern;Decreased step length - left;Decreased stride length;Decreased dorsiflexion - left;Left foot flat;Trunk flexed;Left circumduction   decreased left foot clearance with fatigue   Ambulation Surface Level;Indoor    Gait velocity 0.2 ft/sec    Gait Comments Cues for sequencing and RW progression             Attempted TUG; however, pt too fatigued after amb to be able to progress L LE by end of session                      PT Short Term Goals - 03/17/20 1840      PT SHORT TERM GOAL #1   Title Patient will be able to ambulate 80' with rolling walker to improve household ambulation.    Baseline 40' max with rollator    Time 4    Period Weeks    Status Achieved    Target Date 02/23/20             PT Long Term Goals - 03/17/20 1841        PT LONG TERM GOAL #1   Title Pt will improve TUG score to </= 1 min and 30 sec for improved home mobility    Baseline 2 min 31.5 sec (02/11/20) with shoe cover over L foot to assist with sliding foot forward    Time 8    Period Weeks    Status Revised    Target Date 05/12/20      PT LONG TERM GOAL #2   Title Patient will demo 0.10m/s improvement in 10 MWT with rolling walker to improve household ambulation    Baseline 0.06 m/s (03/17/20)    Time 8    Period Weeks    Status Revised    Target Date 05/12/20      PT LONG TERM GOAL #3   Title Pt would like to be able to amb at least 10 min with RW for improved home mobility    Baseline Currently not ambulating    Time 8    Period Weeks    Status New    Target Date 05/12/20      PT LONG TERM GOAL #4   Title Pt's goal to perform floor to sit transfer with CGA to be able to get up by himself at home.    Time 8    Period Weeks    Status Revised    Target Date 05/12/20                 Plan -  03/17/20 1836    Clinical Impression Statement Re-assessed pt's goals. At this time, discussed paring down PT to every other week due to pt finances. Discussed goals at length with pt and pt states that he would like to be able to tolerate 10 to 15 minutes of ambulating with the RW and practicing floor transfers since pt had difficulty with this at home. Pt has met STG #1. Attempted to assess LTG #1; however, pt too fatigued after ambulating 100' to progress L LE enough for TUG assessment without PT assist.    Personal Factors and Comorbidities Comorbidity 2;Fitness;Time since onset of injury/illness/exacerbation;Transportation    Comorbidities hx of falls, HTN, MS    Examination-Activity Limitations Bed Mobility;Carry;Locomotion Level;Dressing;Stairs;Stand;Transfers;Squat    Examination-Participation Restrictions Cleaning;Community Activity;Meal Prep;Laundry;Shop    Stability/Clinical Decision Making Unstable/Unpredictable    Rehab Potential Fair    PT Frequency Other (comment)   Every other week   PT Duration 8 weeks    PT Treatment/Interventions ADLs/Self Care Home Management;Gait training;Stair training;Functional mobility training;Therapeutic activities;Therapeutic exercise;Balance training;Wheelchair mobility training;Passive range of motion;Orthotic Fit/Training;Energy conservation;Joint Manipulations;Manual techniques;Electrical Stimulation    PT Next Visit Plan Continue to utilize AFO and swedish knee cage. Work on step to pattern with longer L step length to improve L hip flexor fascilitation and shoulder R leg step length where pt brings R foot to L foot    PT Home Exercise Plan seated clam shell, heel slide, SLR in seated, SLR in standing    Consulted and Agree with Plan of Care Patient           Patient will benefit from skilled therapeutic intervention in order to improve the following deficits and impairments:  Abnormal gait, Decreased activity tolerance, Decreased balance,  Decreased endurance, Decreased safety awareness, Decreased range of motion, Decreased mobility, Decreased strength, Difficulty walking, Increased edema, Impaired flexibility, Impaired tone, Impaired vision/preception  Visit Diagnosis: Abnormal posture  Difficulty in walking, not elsewhere classified  Muscle weakness (generalized)  Other abnormalities of gait and mobility  Multiple sclerosis (  HCC)  Unsteadiness on feet     Problem List Patient Active Problem List   Diagnosis Date Noted  . 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  . Abnormality of gait 04/22/2013  . HTN (hypertension) 06/29/2012  . Multiple sclerosis (HCC) 06/29/2012  . Benign prostatic hyperplasia with urinary obstruction 06/29/2012    Gellen April Ma L Nonato PT, DPT 03/17/2020, 6:48 PM  [facility] [address] [place], [place], [address] Phone: [phone]   Fax:  [phone]  Name: Randy Shaw MRN: [id number] Date of Birth: 07/25/1960   

## 2020-03-31 ENCOUNTER — Other Ambulatory Visit: Payer: Self-pay

## 2020-03-31 ENCOUNTER — Ambulatory Visit: Payer: 59 | Attending: Neurology | Admitting: Physical Therapy

## 2020-03-31 DIAGNOSIS — G35 Multiple sclerosis: Secondary | ICD-10-CM | POA: Insufficient documentation

## 2020-03-31 DIAGNOSIS — R262 Difficulty in walking, not elsewhere classified: Secondary | ICD-10-CM

## 2020-03-31 DIAGNOSIS — M6281 Muscle weakness (generalized): Secondary | ICD-10-CM

## 2020-03-31 DIAGNOSIS — R293 Abnormal posture: Secondary | ICD-10-CM

## 2020-03-31 DIAGNOSIS — R2689 Other abnormalities of gait and mobility: Secondary | ICD-10-CM | POA: Diagnosis present

## 2020-03-31 DIAGNOSIS — R2681 Unsteadiness on feet: Secondary | ICD-10-CM

## 2020-03-31 DIAGNOSIS — G35D Multiple sclerosis, unspecified: Secondary | ICD-10-CM

## 2020-03-31 NOTE — Therapy (Signed)
[facility] [address] [place], [place], [address] Phone: [phone]   Fax:  [phone]  Physical Therapy Treatment  Patient Details  Name: Randy Shaw MRN: [id number] Date of Birth: [birth year] Referring Provider (PT): Margie Ege, NP   Encounter Date: 03/31/2020   PT End of Session - 03/31/20 1528    Visit Number 23    Number of Visits 29    Date for PT Re-Evaluation 04/28/20    Authorization Type Eval 09/12/19, Recert 10/07/19, Recert 12/15/19 (1x wk/12 sessions); recert 01/26/20; recert 03/17/20    Progress Note Due on Visit 23    PT Start Time 1450    PT Stop Time 1530    PT Time Calculation (min) 40 min    Equipment Utilized During Treatment Gait belt    Activity Tolerance Patient tolerated treatment well;Patient limited by fatigue    Behavior During Therapy [facility] for tasks assessed/performed           Past Medical History:  Diagnosis Date  . Abnormality of gait 04/22/2013  . Asthma   . BPH (benign prostatic hyperplasia)   . Hypertension   . Multiple sclerosis (HCC)   . 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 none      There were no vitals filed for this visit.   Subjective Assessment - 03/31/20 1453    Subjective Pt reports he has a spot on the side of his baby toe on his left. He believes it is caused by his shoe. Pt states getting around at home very little with the walker. Pt reports he's been standing a lot more for 5-10 minutes. He hasn't made any measurable distance in walking.    Pertinent History BPH, hx of falls, MS, HTN    How long can you sit comfortably? no limits    How long can you stand comfortably? 2 min    How long can you walk comfortably? 5 steps with holding on to furniture    Patient Stated Goals Work on strengthening; walking with the walker    Currently in Pain? No/denies               Amb 50'x2 with  RW CGA; cues for step to pattern and not dragging L foot. 1 seated rest break due to fatigue. Slow gait speed. Unable to perform TUG due to pt fatigue -- pt with difficulty advancing L LE with increased fatigue.  Turning x 4 with SBA; less v/cs required  Tricep dips from low perch on high/low mat x10 reps  Dip down to 6" step with airex foam, dip up onto high/low mat table x 5 reps             PT Education - 03/31/20 1750    Education Details Discussed one technique for floor to chair transfers; discussed learning/progressing getting to the floor safely and strengthening first prior to practicing full technique    Person(s) Educated Patient    Methods Explanation;Demonstration;Verbal cues;Tactile cues    Comprehension Verbalized understanding;Returned demonstration;Verbal cues required            PT Short Term Goals - 03/17/20 1840      PT SHORT TERM GOAL #1   Title Patient will be able to ambulate 20' with rolling walker to improve household ambulation.    Baseline 40' max with rollator    Time 4    Period Weeks  Status Achieved    Target Date 02/23/20             PT Long Term Goals - 03/17/20 1841      PT LONG TERM GOAL #1   Title Pt will improve TUG score to </= 1 min and 30 sec for improved home mobility    Baseline 2 min 31.5 sec (02/11/20) with shoe cover over L foot to assist with sliding foot forward    Time 8    Period Weeks    Status Revised    Target Date 05/12/20      PT LONG TERM GOAL #2   Title Patient will demo 0.41m/s improvement in 10 MWT with rolling walker to improve household ambulation    Baseline 0.06 m/s (03/17/20)    Time 8    Period Weeks    Status Revised    Target Date 05/12/20      PT LONG TERM GOAL #3   Title Pt would like to be able to amb at least 10 min with RW for improved home mobility    Baseline Currently not ambulating    Time 8    Period Weeks    Status New    Target Date 05/12/20      PT LONG TERM GOAL #4    Title Pt's goal to perform floor to sit transfer with CGA to be able to get up by himself at home.    Time 8    Period Weeks    Status Revised    Target Date 05/12/20                 Plan - 03/31/20 1742    Clinical Impression Statement Treatment focused on improving gait, endurance, and strength for floor to chair transfers. Pt with decreased L tricep strength -- encouraged pt work on dips at home and increase amb at home with his son supervising. PT will continue to progress pt towards his goals as able.    Personal Factors and Comorbidities Comorbidity 2;Fitness;Time since onset of injury/illness/exacerbation;Transportation    Comorbidities hx of falls, HTN, MS    Examination-Activity Limitations Bed Mobility;Carry;Locomotion Level;Dressing;Stairs;Stand;Transfers;Squat    Examination-Participation Restrictions Cleaning;Community Activity;Meal Prep;Laundry;Shop    Stability/Clinical Decision Making Unstable/Unpredictable    Rehab Potential Fair    PT Frequency Other (comment)   Every other week   PT Duration 8 weeks    PT Treatment/Interventions ADLs/Self Care Home Management;Gait training;Stair training;Functional mobility training;Therapeutic activities;Therapeutic exercise;Balance training;Wheelchair mobility training;Passive range of motion;Orthotic Fit/Training;Energy conservation;Joint Manipulations;Manual techniques;Electrical Stimulation    PT Next Visit Plan Continue to utilize AFO and swedish knee cage. Perform TUG if able. Continue to work on gait/endurance. Continue to progress pt to floor transfers as able.    PT Home Exercise Plan seated clam shell, heel slide, SLR in seated, SLR in standing; tricep dips at EOB, pt to walk 2-3 laps with RW at home with son supervision    Consulted and Agree with Plan of Care Patient           Patient will benefit from skilled therapeutic intervention in order to improve the following deficits and impairments:  Abnormal gait,  Decreased activity tolerance, Decreased balance, Decreased endurance, Decreased safety awareness, Decreased range of motion, Decreased mobility, Decreased strength, Difficulty walking, Increased edema, Impaired flexibility, Impaired tone, Impaired vision/preception  Visit Diagnosis: Abnormal posture  Difficulty in walking, not elsewhere classified  Muscle weakness (generalized)  Other abnormalities of gait and mobility  Multiple sclerosis (HCC)  Unsteadiness on feet     Problem List Patient Active Problem List   Diagnosis Date Noted  . 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  . Abnormality of gait 04/22/2013  . HTN (hypertension) 06/29/2012  . Multiple sclerosis (HCC) 06/29/2012  . Benign prostatic hyperplasia with urinary obstruction 06/29/2012    Brayla Pat April Ma L Viggo Perko PT, DPT 03/31/2020, 5:51 PM  [facility] [address] [place], [place], [address] Phone: [phone]   Fax:  [phone]  Name: RAIJON LINDFORS MRN: [id number] Date of Birth: 01/28/1961

## 2020-04-04 ENCOUNTER — Other Ambulatory Visit: Payer: Self-pay | Admitting: Family Medicine

## 2020-04-04 DIAGNOSIS — I10 Essential (primary) hypertension: Secondary | ICD-10-CM

## 2020-04-04 NOTE — Telephone Encounter (Signed)
Requested Prescriptions  Pending Prescriptions Disp Refills  . hydrochlorothiazide (MICROZIDE) 12.5 MG capsule [Pharmacy Med Name: HYDROCHLOROTHIAZIDE 12.5MG  CAPSULES] 90 capsule 0    Sig: TAKE 1 CAPSULE(12.5 MG) BY MOUTH DAILY     Cardiovascular: Diuretics - Thiazide Passed - 04/04/2020  6:29 AM      Passed - Ca in normal range and within 360 days    Calcium  Date Value Ref Range Status  02/05/2020 9.6 8.6 - 10.2 mg/dL Final   Calcium, Ion  Date Value Ref Range Status  01/31/2009 1.16 1.12 - 1.32 mmol/L Final         Passed - Cr in normal range and within 360 days    Creat  Date Value Ref Range Status  07/30/2013 0.89 0.50 - 1.35 mg/dL Final   Creatinine, Ser  Date Value Ref Range Status  02/05/2020 0.89 0.76 - 1.27 mg/dL Final         Passed - K in normal range and within 360 days    Potassium  Date Value Ref Range Status  02/05/2020 4.1 3.5 - 5.2 mmol/L Final         Passed - Na in normal range and within 360 days    Sodium  Date Value Ref Range Status  02/05/2020 144 134 - 144 mmol/L Final         Passed - Last BP in normal range    BP Readings from Last 1 Encounters:  02/05/20 120/72         Passed - Valid encounter within last 6 months    Recent Outpatient Visits          4 months ago Tinea pedis of left foot   [facility], Asencion Partridge, MD   5 months ago Essential hypertension   [facility], Asencion Partridge, MD   1 year ago Hyperlipidemia, unspecified hyperlipidemia type   [facility], Asencion Partridge, MD   1 year ago Hyperlipidemia, unspecified hyperlipidemia type   [facility], Asencion Partridge, MD   2 years ago Essential hypertension   [facility], Asencion Partridge, MD

## 2020-04-14 ENCOUNTER — Ambulatory Visit: Payer: 59 | Admitting: Physical Therapy

## 2020-04-28 ENCOUNTER — Ambulatory Visit: Payer: 59 | Admitting: Physical Therapy

## 2020-04-28 ENCOUNTER — Other Ambulatory Visit: Payer: Self-pay

## 2020-04-28 DIAGNOSIS — R2681 Unsteadiness on feet: Secondary | ICD-10-CM

## 2020-04-28 DIAGNOSIS — G35 Multiple sclerosis: Secondary | ICD-10-CM

## 2020-04-28 DIAGNOSIS — R293 Abnormal posture: Secondary | ICD-10-CM | POA: Diagnosis not present

## 2020-04-28 DIAGNOSIS — R2689 Other abnormalities of gait and mobility: Secondary | ICD-10-CM

## 2020-04-28 DIAGNOSIS — R262 Difficulty in walking, not elsewhere classified: Secondary | ICD-10-CM

## 2020-04-28 DIAGNOSIS — M6281 Muscle weakness (generalized): Secondary | ICD-10-CM

## 2020-04-28 NOTE — Patient Instructions (Signed)
Access Code: [email or web address] URL: [email or web address] Date: 04/28/2020 Prepared by: Vernon Prey April Kirstie Peri  Exercises Standing with Head Rotation - 1 x daily - 7 x weekly - 3 sets - 5 reps Standing with Head Nod - 1 x daily - 7 x weekly - 3 sets - 5 reps Alternating Step Forward with Support - 1 x daily - 7 x weekly - 2 sets - 10 reps Standing Alternating Shoulder Flexion - 1 x daily - 7 x weekly - 2 sets - 10 reps

## 2020-04-28 NOTE — Therapy (Signed)
[facility] [address] [place], [place], [address] Phone: [phone]   Fax:  [phone]  Physical Therapy Treatment  Patient Details  Name: Randy Shaw MRN: [id number] Date of Birth: [birth year] Referring Provider (PT): Margie Ege, NP   Encounter Date: 04/28/2020   PT End of Session - 04/28/20 1536    Visit Number 24    Number of Visits 29    Date for PT Re-Evaluation 04/28/20    Authorization Type Eval 09/12/19, Recert 10/07/19, Recert 12/15/19 (1x wk/12 sessions); recert 01/26/20; recert 03/17/20    Progress Note Due on Visit 23    PT Start Time 1445    PT Stop Time 1530    PT Time Calculation (min) 45 min    Equipment Utilized During Treatment Gait belt    Activity Tolerance Patient tolerated treatment well;Patient limited by fatigue    Behavior During Therapy [facility] for tasks assessed/performed           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    Past Surgical History:  Procedure Laterality Date   none      There were no vitals filed for this visit.   Subjective Assessment - 04/28/20 1444    Subjective Pt reports he took a fall in the beginning of December and had a huge bruise on the left side of his leg. Pt states it kept him from doing a lot of movement. Pt states it happened while he was standing at the sink and is not sure what happened but he landed on his left side. Son was able to help him get up. Pt states he has not been able to do any of the exercises or walking with the walker. He reports it is not yet normal but getting better.    Pertinent History BPH, hx of falls, MS, HTN    How long can you sit comfortably? no limits    How long can you stand comfortably? 2 min    How long can you walk comfortably? 5 steps  with holding on to furniture    Patient Stated Goals Work on strengthening; walking with the walker    Currently in Pain? No/denies              [facility] PT Assessment - 04/28/20 0001      Ambulation/Gait   Ambulation Distance (Feet) 20 Feet   x3 reps     Timed Up and Go Test   Normal TUG (seconds) --   trial 1: 4 min 4 sec; trial 2: 5 min 39 sec; attempted trial 3 but pt required assist to progress L LE                        [facility] Adult PT Treatment/Exercise - 04/28/20 0001      Transfers   Sit to Stand 4: Min guard    Sit to Stand Details Manual facilitation for weight shifting;Manual facilitation for weight bearing;Tactile cues for weight shifting    Sit to Stand Details (indicate cue type and reason) cues for controlled descent    Five time sit to stand comments  1 min 1 sec      Ambulation/Gait   Ambulation/Gait Assistance 4: Min guard;4: Min assist  Ambulation/Gait Assistance Details Required min A as L LE fatigued with increased amb    Assistive device Rolling walker    Gait Pattern Decreased step length - left;Decreased dorsiflexion - left;Left foot flat;Trunk flexed;Left circumduction;Step-to pattern;Decreased hip/knee flexion - left;Decreased weight shift to left    Ambulation Surface Level;Indoor    Gait Comments Cues for sequencing and RW progression               Balance Exercises - 04/28/20 0001      Balance Exercises: Standing   Standing Eyes Opened Wide (BOA);Head turns;Solid surface;10 secs   10 sec holding upright balance; 3x5 head nods and head turns each; alternating UE flexion x10 with UE support            PT Education - 04/28/20 1819    Education Details Pt instructed to work on pregait and balance activities at home if he still does not feel well enough to perform ambulation with his RW. Updated pt's HEP accordingly.            PT Short Term Goals - 03/17/20 1840      PT SHORT TERM GOAL #1   Title Patient will be able  to ambulate 16' with rolling walker to improve household ambulation.    Baseline 40' max with rollator    Time 4    Period Weeks    Status Achieved    Target Date 02/23/20             PT Long Term Goals - 03/17/20 1841      PT LONG TERM GOAL #1   Title Pt will improve TUG score to </= 1 min and 30 sec for improved home mobility    Baseline 2 min 31.5 sec (02/11/20) with shoe cover over L foot to assist with sliding foot forward    Time 8    Period Weeks    Status Revised    Target Date 05/12/20      PT LONG TERM GOAL #2   Title Patient will demo 0.74m/s improvement in 10 MWT with rolling walker to improve household ambulation    Baseline 0.06 m/s (03/17/20)    Time 8    Period Weeks    Status Revised    Target Date 05/12/20      PT LONG TERM GOAL #3   Title Pt would like to be able to amb at least 10 min with RW for improved home mobility    Baseline Currently not ambulating    Time 8    Period Weeks    Status New    Target Date 05/12/20      PT LONG TERM GOAL #4   Title Pt's goal to perform floor to sit transfer with CGA to be able to get up by himself at home.    Time 8    Period Weeks    Status Revised    Target Date 05/12/20                 Plan - 04/28/20 1816    Clinical Impression Statement Pt demonstrates decreased function -- pt unable to amb as far or as long as previous session with even less L LE foot clearance and step length. Gait speed has decreased as well. Pt requires cueing for more efficient turning. These changes appears likely to be the result of pt's decreased mobility after his earlier fall. Treatment session focused primarily on gait and balance. Encouraged pt to work on  balance/standing exercises at home to regain mobility lost.    Personal Factors and Comorbidities Comorbidity 2;Fitness;Time since onset of injury/illness/exacerbation;Transportation    Comorbidities hx of falls, HTN, MS    Examination-Activity Limitations Bed  Mobility;Carry;Locomotion Level;Dressing;Stairs;Stand;Transfers;Squat    Examination-Participation Restrictions Cleaning;Community Activity;Meal Prep;Laundry;Shop    Stability/Clinical Decision Making Unstable/Unpredictable    Rehab Potential Fair    PT Frequency Other (comment)   Every other week   PT Duration 8 weeks    PT Treatment/Interventions ADLs/Self Care Home Management;Gait training;Stair training;Functional mobility training;Therapeutic activities;Therapeutic exercise;Balance training;Wheelchair mobility training;Passive range of motion;Orthotic Fit/Training;Energy conservation;Joint Manipulations;Manual techniques;Electrical Stimulation    PT Next Visit Plan Continue to utilize AFO and swedish knee cage. Perform TUG if able. Continue to work on gait/endurance. Continue to progress pt to floor transfers as able. Continue to work on balance as able    PT Home Exercise Plan Access Code [email or web address]; tricep dips at EOB, pt to walk 2-3 laps with RW at home with son supervision    Consulted and Agree with Plan of Care Patient           Patient will benefit from skilled therapeutic intervention in order to improve the following deficits and impairments:  Abnormal gait,Decreased activity tolerance,Decreased balance,Decreased endurance,Decreased safety awareness,Decreased range of motion,Decreased mobility,Decreased strength,Difficulty walking,Increased edema,Impaired flexibility,Impaired tone,Impaired vision/preception  Visit Diagnosis: Abnormal posture  Difficulty in walking, not elsewhere classified  Muscle weakness (generalized)  Other abnormalities of gait and mobility  Multiple sclerosis (HCC)  Unsteadiness on feet     Problem List Patient Active Problem List   Diagnosis Date Noted   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   Abnormality of gait 04/22/2013   HTN (hypertension) 06/29/2012   Multiple sclerosis (HCC) 06/29/2012   Benign prostatic hyperplasia with  urinary obstruction 06/29/2012    Abrahm Mancia April Ma L Hilde Churchman PT, DPT 04/28/2020, 6:24 PM  [facility] [address] [place], [place], [address] Phone: [phone]   Fax:  [phone]  Name: Randy Shaw MRN: [id number] Date of Birth: 1960-11-11

## 2020-06-10 ENCOUNTER — Telehealth: Payer: Self-pay | Admitting: Emergency Medicine

## 2020-06-10 NOTE — Telephone Encounter (Signed)
PA started for [employer] on CMM.  Key: [id number] Awaiting determination from [employer].

## 2020-06-13 ENCOUNTER — Other Ambulatory Visit: Payer: Self-pay | Admitting: Family Medicine

## 2020-06-13 DIAGNOSIS — I10 Essential (primary) hypertension: Secondary | ICD-10-CM

## 2020-06-13 NOTE — Telephone Encounter (Signed)
Patient is due 6 month follow-up- request after hours- courtesy RF sent with note to schedule appointment.

## 2020-06-15 NOTE — Telephone Encounter (Signed)
The request has been approved. The authorization is effective from 06/10/2020 to 06/09/2021, as long as the member is enrolled in their current health plan.

## 2020-07-01 ENCOUNTER — Other Ambulatory Visit: Payer: Self-pay

## 2020-07-01 ENCOUNTER — Emergency Department (HOSPITAL_BASED_OUTPATIENT_CLINIC_OR_DEPARTMENT_OTHER)
Admission: EM | Admit: 2020-07-01 | Discharge: 2020-07-02 | Disposition: A | Discharge status: Home / Self Care | Payer: 59 | Attending: Emergency Medicine | Admitting: Emergency Medicine

## 2020-07-01 DIAGNOSIS — Z91010 Allergy to peanuts: Secondary | ICD-10-CM | POA: Diagnosis not present

## 2020-07-01 DIAGNOSIS — J45909 Unspecified asthma, uncomplicated: Secondary | ICD-10-CM | POA: Insufficient documentation

## 2020-07-01 DIAGNOSIS — I10 Essential (primary) hypertension: Secondary | ICD-10-CM | POA: Diagnosis not present

## 2020-07-01 DIAGNOSIS — Z79899 Other long term (current) drug therapy: Secondary | ICD-10-CM | POA: Insufficient documentation

## 2020-07-01 DIAGNOSIS — R339 Retention of urine, unspecified: Secondary | ICD-10-CM | POA: Insufficient documentation

## 2020-07-01 NOTE — ED Provider Notes (Signed)
[facility] EMERGENCY DEPARTMENT Provider Note   CSN: [id number] Arrival date & time: 07/01/20  2314     History Chief Complaint  Patient presents with  . Urinary Retention    Randy Shaw is a 60 y.o. male.  Patient presents to the emergency department with urinary retention.  Patient reports that he has not urinated in nearly 24 hours.  Patient has history of MS.  He reports requiring Foley catheters in the past secondary to urinary retention.  Patient with lower abdominal/pelvic discomfort.        Past Medical History:  Diagnosis Date  . Abnormality of gait 04/22/2013  . Asthma   . BPH (benign prostatic hyperplasia)   . Hypertension   . Multiple sclerosis (HCC)   . 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    Patient Active Problem List   Diagnosis Date Noted  . 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  . Abnormality of gait 04/22/2013  . HTN (hypertension) 06/29/2012  . Multiple sclerosis (HCC) 06/29/2012  . Benign prostatic hyperplasia with urinary obstruction 06/29/2012    Past Surgical History:  Procedure Laterality Date  . none         Family History  Problem Relation Age of Onset  . Cancer Mother        breast  . Stroke Father   . Cancer - Colon Father   . Multiple sclerosis Neg Hx     Social History   Tobacco Use  . Smoking status: Never Smoker  . Smokeless tobacco: Never Used  Substance Use Topics  . Alcohol use: Yes    Alcohol/week: 0.0 standard drinks    Comment: occasionally wine cooler  . Drug use: No    Home Medications Prior to Admission medications   Medication Sig Start Date End Date Taking? Authorizing Provider  Armodafinil 150 MG tablet Take 1 tablet (150 mg total) by mouth daily. 02/05/20   Glean Salvo, NP  baclofen (LIORESAL) 20 MG tablet TAKE 1 TABLET BY MOUTH IN THE MORNING, 1 TABLET AT NOON, AND 2 TABLETS AT NIGHT 08/06/19   Glean Salvo, NP  hydrochlorothiazide (MICROZIDE) 12.5 MG capsule  TAKE 1 CAPSULE(12.5 MG) BY MOUTH DAILY 06/13/20   Shade Flood, MD  MYRBETRIQ 50 MG TB24 tablet  02/16/19   [provider]  tamsulosin (FLOMAX) 0.4 MG CAPS capsule Take 1 capsule (0.4 mg total) by mouth daily. 07/01/14   Tonye Pearson, MD  TECFIDERA 240 MG CPDR Take 1 capsule (240 mg total) by mouth 2 (two) times daily. 02/05/20   Glean Salvo, NP  tiZANidine (ZANAFLEX) 2 MG tablet Take 1 tablet (2 mg total) by mouth every 8 (eight) hours as needed for muscle spasms. 02/05/20   Glean Salvo, NP    Allergies    Peanut-containing drug products and Zanaflex [tizanidine hcl]  Review of Systems   Review of Systems  Genitourinary: Positive for decreased urine volume.  All other systems reviewed and are negative.   Physical Exam Updated Vital Signs BP (!) 184/106   Pulse (!) 111   Temp 99 F (37.2 C) (Oral)   Resp 18   SpO2 95%   Physical Exam Vitals and nursing note reviewed.  Constitutional:      General: He is not in acute distress.    Appearance: Normal appearance. He is well-developed and well-nourished.  HENT:     Head: Normocephalic and atraumatic.     Right Ear: Hearing normal.     Left  Ear: Hearing normal.     Nose: Nose normal.     Mouth/Throat:     Mouth: Oropharynx is clear and moist and mucous membranes are normal.  Eyes:     Extraocular Movements: EOM normal.     Conjunctiva/sclera: Conjunctivae normal.     Pupils: Pupils are equal, round, and reactive to light.  Cardiovascular:     Rate and Rhythm: Regular rhythm.     Heart sounds: S1 normal and S2 normal. No murmur heard. No friction rub. No gallop.   Pulmonary:     Effort: Pulmonary effort is normal. No respiratory distress.     Breath sounds: Normal breath sounds.  Chest:     Chest wall: No tenderness.  Abdominal:     General: Bowel sounds are normal.     Palpations: Abdomen is soft. There is no hepatosplenomegaly.     Tenderness: There is abdominal tenderness in the suprapubic area.  There is no guarding or rebound. Negative signs include Murphy's sign and McBurney's sign.     Hernia: No hernia is present.  Musculoskeletal:        General: Normal range of motion.     Cervical back: Normal range of motion and neck supple.  Skin:    General: Skin is warm, dry and intact.     Findings: No rash.     Nails: There is no cyanosis.  Neurological:     Mental Status: He is alert and oriented to person, place, and time.     GCS: GCS eye subscore is 4. GCS verbal subscore is 5. GCS motor subscore is 6.     Cranial Nerves: No cranial nerve deficit.     Sensory: No sensory deficit.     Coordination: Coordination normal.     Deep Tendon Reflexes: Strength normal.  Psychiatric:        Mood and Affect: Mood and affect normal.        Speech: Speech normal.        Behavior: Behavior normal.        Thought Content: Thought content normal.     ED Results / Procedures / Treatments   Labs (all labs ordered are listed, but only abnormal results are displayed) Labs Reviewed  URINALYSIS, ROUTINE W REFLEX MICROSCOPIC - Abnormal; Notable for the following components:      Result Value   Ketones, ur 15 (*)    All other components within normal limits  BASIC METABOLIC PANEL - Abnormal; Notable for the following components:   Potassium 3.2 (*)    Glucose, Bld 154 (*)    BUN 21 (*)    All other components within normal limits    EKG None  Radiology No results found.  Procedures Procedures   Medications Ordered in ED Medications - No data to display  ED Course  I have reviewed the triage vital signs and the nursing notes.  Pertinent labs & imaging results that were available during my care of the patient were reviewed by me and considered in my medical decision making (see chart for details).    MDM Rules/Calculators/A&P                          Patient presents with acute urinary retention.  He does have urinary difficulty in the past, on Flomax and managed by Dr.  Annabell Howells.  Unclear if this is secondary to outlet obstruction or his MS.  He does not have any other  new numbness, tingling, weakness or signs of his MS worsening at this time..  A Foley catheter was placed to drain his bladder.  Renal function is normal.  No signs of infection.  Patient will be discharged with catheter in place, follow-up with his urologist and [facility] neurology.  Final Clinical Impression(s) / ED Diagnoses Final diagnoses:  Urinary retention    Rx / DC Orders ED Discharge Orders    None       Donyel Nester, Canary Brim, MD 07/02/20 [phone]

## 2020-07-01 NOTE — ED Triage Notes (Signed)
Reports urinary retention x24 hours. Hx MS

## 2020-07-02 LAB — URINALYSIS, ROUTINE W REFLEX MICROSCOPIC
Bilirubin Urine: NEGATIVE
Glucose, UA: NEGATIVE mg/dL
Hgb urine dipstick: NEGATIVE
Ketones, ur: 15 mg/dL — AB
Leukocytes,Ua: NEGATIVE
Nitrite: NEGATIVE
Protein, ur: NEGATIVE mg/dL
Specific Gravity, Urine: 1.025 (ref 1.005–1.030)
pH: 5 (ref 5.0–8.0)

## 2020-07-02 LAB — BASIC METABOLIC PANEL
Anion gap: 15 (ref 5–15)
BUN: 21 mg/dL — ABNORMAL HIGH (ref 6–20)
CO2: 22 mmol/L (ref 22–32)
Calcium: 9.6 mg/dL (ref 8.9–10.3)
Chloride: 102 mmol/L (ref 98–111)
Creatinine, Ser: 1.05 mg/dL (ref 0.61–1.24)
GFR, Estimated: >60 mL/min (ref >60)
Glucose, Bld: 154 mg/dL — ABNORMAL HIGH (ref 70–99)
Potassium: 3.2 mmol/L — ABNORMAL LOW (ref 3.5–5.1)
Sodium: 139 mmol/L (ref 135–145)

## 2020-07-18 ENCOUNTER — Other Ambulatory Visit: Payer: Self-pay

## 2020-07-18 ENCOUNTER — Encounter (HOSPITAL_BASED_OUTPATIENT_CLINIC_OR_DEPARTMENT_OTHER): Payer: Self-pay | Admitting: Emergency Medicine

## 2020-07-18 ENCOUNTER — Emergency Department (HOSPITAL_BASED_OUTPATIENT_CLINIC_OR_DEPARTMENT_OTHER)
Admission: EM | Admit: 2020-07-18 | Discharge: 2020-07-18 | Disposition: A | Discharge status: Home / Self Care | Payer: 59 | Attending: Emergency Medicine | Admitting: Emergency Medicine

## 2020-07-18 DIAGNOSIS — R339 Retention of urine, unspecified: Secondary | ICD-10-CM | POA: Diagnosis not present

## 2020-07-18 DIAGNOSIS — J45909 Unspecified asthma, uncomplicated: Secondary | ICD-10-CM | POA: Diagnosis not present

## 2020-07-18 DIAGNOSIS — I10 Essential (primary) hypertension: Secondary | ICD-10-CM | POA: Insufficient documentation

## 2020-07-18 DIAGNOSIS — R310 Gross hematuria: Secondary | ICD-10-CM | POA: Diagnosis not present

## 2020-07-18 DIAGNOSIS — Z91010 Allergy to peanuts: Secondary | ICD-10-CM | POA: Insufficient documentation

## 2020-07-18 DIAGNOSIS — Z79899 Other long term (current) drug therapy: Secondary | ICD-10-CM | POA: Insufficient documentation

## 2020-07-18 LAB — URINALYSIS, MICROSCOPIC (REFLEX)

## 2020-07-18 LAB — URINALYSIS, ROUTINE W REFLEX MICROSCOPIC
Bilirubin Urine: NEGATIVE
Glucose, UA: NEGATIVE mg/dL
Ketones, ur: NEGATIVE mg/dL
Leukocytes,Ua: NEGATIVE
Nitrite: NEGATIVE
Protein, ur: NEGATIVE mg/dL
Specific Gravity, Urine: 1.025 (ref 1.005–1.030)
pH: 6 (ref 5.0–8.0)

## 2020-07-18 NOTE — ED Triage Notes (Signed)
Reports his catheter was removed on Friday.  Was able to void okay until yesterday.  Last time he was able to void was Saturday morning.  Notably uncomfortable in triage.

## 2020-07-18 NOTE — ED Provider Notes (Signed)
[facility] EMERGENCY DEPARTMENT Provider Note  CSN: [id number] Arrival date & time: 07/18/20 6381  Chief Complaint(s) Urinary Retention  HPI Randy Shaw is a 60 y.o. male with a past medical history listed below including BPH who recently had acute urinary retention requiring a Foley.  Patient followed up with urology Friday and had the Foley removed.  He was able to void that day and the following morning however has not been able to void for 2 days.  Came in for retention and increased suprapubic distention and discomfort.  No nausea or vomiting.  No fevers or chills.  No other physical complaints.  HPI  Past Medical History Past Medical History:  Diagnosis Date  . Abnormality of gait 04/22/2013  . Asthma   . BPH (benign prostatic hyperplasia)   . Hypertension   . Multiple sclerosis (HCC)   . 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   Patient Active Problem List   Diagnosis Date Noted  . 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  . Abnormality of gait 04/22/2013  . HTN (hypertension) 06/29/2012  . Multiple sclerosis (HCC) 06/29/2012  . Benign prostatic hyperplasia with urinary obstruction 06/29/2012   Home Medication(s) Prior to Admission medications   Medication Sig Start Date End Date Taking? Authorizing Provider  Armodafinil 150 MG tablet Take 1 tablet (150 mg total) by mouth daily. 02/05/20   Glean Salvo, NP  baclofen (LIORESAL) 20 MG tablet TAKE 1 TABLET BY MOUTH IN THE MORNING, 1 TABLET AT NOON, AND 2 TABLETS AT NIGHT 08/06/19   Glean Salvo, NP  hydrochlorothiazide (MICROZIDE) 12.5 MG capsule TAKE 1 CAPSULE(12.5 MG) BY MOUTH DAILY 06/13/20   Shade Flood, MD  MYRBETRIQ 50 MG TB24 tablet  02/16/19   [provider]  tamsulosin (FLOMAX) 0.4 MG CAPS capsule Take 1 capsule (0.4 mg total) by mouth daily. 07/01/14   Tonye Pearson, MD  TECFIDERA 240 MG CPDR Take 1 capsule (240 mg total) by mouth 2 (two) times daily. 02/05/20    Glean Salvo, NP  tiZANidine (ZANAFLEX) 2 MG tablet Take 1 tablet (2 mg total) by mouth every 8 (eight) hours as needed for muscle spasms. 02/05/20   Glean Salvo, NP                                                                                                                                    Past Surgical History Past Surgical History:  Procedure Laterality Date  . none     Family History Family History  Problem Relation Age of Onset  . Cancer Mother        breast  . Stroke Father   . Cancer - Colon Father   . Multiple sclerosis Neg Hx     Social History Social History   Tobacco Use  . Smoking status: Never Smoker  . Smokeless tobacco: Never Used  Substance Use Topics  . Alcohol use:  Yes    Alcohol/week: 0.0 standard drinks    Comment: occasionally wine cooler  . Drug use: No   Allergies Peanut-containing drug products and Zanaflex [tizanidine hcl]  Review of Systems Review of Systems All other systems are reviewed and are negative for acute change except as noted in the HPI  Physical Exam Vital Signs  I have reviewed the triage vital signs BP (!) 160/98 (BP Location: Right Arm)   Pulse (!) 103   Temp 98.5 F (36.9 C) (Oral)   Resp 18   Ht 6\' 3"  (1.905 m)   Wt 95.3 kg   SpO2 97%   BMI 26.25 kg/m   Physical Exam Vitals reviewed.  Constitutional:      General: He is not in acute distress.    Appearance: He is well-developed. He is not diaphoretic.  HENT:     Head: Normocephalic and atraumatic.     Jaw: No trismus.     Right Ear: External ear normal.     Left Ear: External ear normal.     Nose: Nose normal.  Eyes:     General: No scleral icterus.    Conjunctiva/sclera: Conjunctivae normal.  Neck:     Trachea: Phonation normal.  Cardiovascular:     Rate and Rhythm: Normal rate and regular rhythm.  Pulmonary:     Effort: Pulmonary effort is normal. No respiratory distress.     Breath sounds: No stridor.  Abdominal:     General: There is  distension.     Tenderness: There is abdominal tenderness in the suprapubic area.  Musculoskeletal:        General: Normal range of motion.     Cervical back: Normal range of motion.  Neurological:     Mental Status: He is alert and oriented to person, place, and time.     Comments: Baseline lower extremity weakness from MS.   Psychiatric:        Behavior: Behavior normal.     ED Results and Treatments Labs (all labs ordered are listed, but only abnormal results are displayed) Labs Reviewed  URINALYSIS, ROUTINE W REFLEX MICROSCOPIC - Abnormal; Notable for the following components:      Result Value   Hgb urine dipstick MODERATE (*)    All other components within normal limits  URINALYSIS, MICROSCOPIC (REFLEX) - Abnormal; Notable for the following components:   Bacteria, UA RARE (*)    All other components within normal limits                                                                                                                         EKG  EKG Interpretation  Date/Time:    Ventricular Rate:    PR Interval:    QRS Duration:   QT Interval:    QTC Calculation:   R Axis:     Text Interpretation:        Radiology No results found.  Pertinent labs & imaging results that were available  during my care of the patient were reviewed by me and considered in my medical decision making (see chart for details).  Medications Ordered in ED Medications - No data to display                                                                                                                                  Procedures Procedures  (including critical care time)  Medical Decision Making / ED Course I have reviewed the nursing notes for this encounter and the patient's prior records (if available in EHR or on provided paperwork).   Randy Shaw was evaluated in Emergency Department on 07/18/2020 for the symptoms described in the history of present illness. He was evaluated in the  context of the global COVID-19 pandemic, which necessitated consideration that the patient might be at risk for infection with the SARS-CoV-2 virus that causes COVID-19. Institutional protocols and algorithms that pertain to the evaluation of patients at risk for COVID-19 are in a state of rapid change based on information released by regulatory bodies including the CDC and federal and state organizations. These policies and algorithms were followed during the patient's care in the ED.  Work-up consistent with urinary retention.  Foley placed.  UA without evidence of infection.  Urology follow-up       Final Clinical Impression(s) / ED Diagnoses Final diagnoses:  Urinary retention  Gross hematuria   The patient appears reasonably screened and/or stabilized for discharge and I doubt any other medical condition or other [facility] requiring further screening, evaluation, or treatment in the ED at this time prior to discharge. Safe for discharge with strict return precautions.  Disposition: Discharge  Condition: Good  I have discussed the results, Dx and Tx plan with the patient/family who expressed understanding and agree(s) with the plan. Discharge instructions discussed at length. The patient/family was given strict return precautions who verbalized understanding of the instructions. No further questions at time of discharge.    ED Discharge Orders    None       Follow Up: Bjorn Pippin, MD [address] [place] [address] [phone]  Call  to schedule an appointment for close follow up      This chart was dictated using voice recognition software.  Despite best efforts to proofread,  errors can occur which can change the documentation meaning.   Nira Conn, MD 07/18/20 [phone]

## 2020-07-30 ENCOUNTER — Telehealth: Payer: Self-pay | Admitting: Neurology

## 2020-07-30 NOTE — Telephone Encounter (Signed)
Pt called stating that he is needing equipment for his wheelchair and he would like to speak to the RN. Please advise.

## 2020-08-04 ENCOUNTER — Other Ambulatory Visit: Payer: Self-pay | Admitting: Neurology

## 2020-08-04 DIAGNOSIS — G35 Multiple sclerosis: Secondary | ICD-10-CM

## 2020-08-09 NOTE — Telephone Encounter (Signed)
Faxed signed order for repair of wheelchair to [employer]  OK transmission received

## 2020-08-10 ENCOUNTER — Encounter: Payer: Self-pay | Admitting: Neurology

## 2020-08-10 ENCOUNTER — Ambulatory Visit (INDEPENDENT_AMBULATORY_CARE_PROVIDER_SITE_OTHER): Payer: 59 | Admitting: Neurology

## 2020-08-10 VITALS — BP 132/74 | HR 86 | Ht 72.0 in | Wt 200.0 lb

## 2020-08-10 DIAGNOSIS — R269 Unspecified abnormalities of gait and mobility: Secondary | ICD-10-CM

## 2020-08-10 DIAGNOSIS — G35 Multiple sclerosis: Secondary | ICD-10-CM

## 2020-08-10 DIAGNOSIS — N319 Neuromuscular dysfunction of bladder, unspecified: Secondary | ICD-10-CM

## 2020-08-10 DIAGNOSIS — G8250 Quadriplegia, unspecified: Secondary | ICD-10-CM | POA: Diagnosis not present

## 2020-08-10 DIAGNOSIS — Z5181 Encounter for therapeutic drug level monitoring: Secondary | ICD-10-CM

## 2020-08-10 MED ORDER — ARMODAFINIL 150 MG PO TABS: 150.0000 mg | ORAL_TABLET | Freq: Every day | ORAL | 1 refills | Status: DC

## 2020-08-10 MED ORDER — BACLOFEN 20 MG PO TABS: ORAL_TABLET | ORAL | 3 refills | Status: DC

## 2020-08-10 NOTE — Progress Notes (Signed)
Reason for visit: Multiple sclerosis, gait disorder, neurogenic bladder  Randy Shaw is an 60 y.o. male  History of present illness:  Randy Shaw is a 60 year old right-handed black male with a history of multiple sclerosis.  The patient has a significant problem with weakness in the legs, left greater than right.  He has fallen on occasion when trying to transfer from the bed to the wheelchair.  He has noted over the last several weeks that he has had some problems with voiding the bladder, he has been to the emergency room on 2 occasions on 01 July 2020, and then again on 18 July 2020.  The patient had a Foley catheter placed for about a week each time, but he has not been able to void the bladder once the catheter was removed.  He is followed through urology, he is seen by Dr. Annabell Howells.  The patient denies any low back pain or pain down the legs on either side.  He does have some altered sensation of the bladder and bowel, he has some urgency with the bowels, this is chronic for him.  There is some question of benign prostate enlargement.  He has not noted any new numbness or weakness of the arms or new vision changes, he does have chronic double vision when looking to the right.  He last had MRI of the brain and cervical spinal cord about a year ago.  He remains on Tecfidera.  Past Medical History:  Diagnosis Date  . Abnormality of gait 04/22/2013  . Asthma   . BPH (benign prostatic hyperplasia)   . Hypertension   . Multiple sclerosis (HCC)   . 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 none      Family History  Problem Relation Age of Onset  . Cancer Mother        breast  . Stroke Father   . Cancer - Colon Father   . Multiple sclerosis Neg Hx     Social history:  reports that he has never smoked. He has never used smokeless tobacco. He reports previous alcohol use. He reports that he does not use drugs.     Allergies  Allergen Reactions  . Peanut-Containing Drug Products Shortness Of Breath  . Zanaflex [Tizanidine Hcl] Shortness Of Breath    Medications:  Prior to Admission medications   Medication Sig Start Date End Date Taking? Authorizing Provider  Armodafinil 150 MG tablet Take 1 tablet (150 mg total) by mouth daily. 02/05/20  Yes Glean Salvo, NP  baclofen (LIORESAL) 20 MG tablet TAKE 1 TABLET BY MOUTH IN THE MORNING, 1 TABLET AT NOON, AND 2 TABLETS AT NIGHT 08/06/19  Yes Glean Salvo, NP  hydrochlorothiazide (MICROZIDE) 12.5 MG capsule TAKE 1 CAPSULE(12.5 MG) BY MOUTH DAILY 06/13/20  Yes Shade Flood, MD  MYRBETRIQ 50 MG TB24 tablet  02/16/19  Yes [provider]  tamsulosin (FLOMAX) 0.4 MG CAPS capsule Take 1 capsule (0.4 mg total) by mouth daily. 07/01/14  Yes Tonye Pearson, MD  TECFIDERA 240 MG CPDR Take 1 capsule (240 mg total) by mouth 2 (two) times daily. 02/05/20  Yes Glean Salvo, NP  tiZANidine (ZANAFLEX) 2 MG tablet Take 1 tablet (2 mg total) by mouth every 8 (eight) hours as needed for muscle spasms. 02/05/20  Yes Glean Salvo, NP    ROS:  Out of a complete 14 system review of symptoms, the  patient complains only of the following symptoms, and all other reviewed systems are negative.  Difficulty voiding bladder Weakness, walking difficulty Double vision  Blood pressure 132/74, pulse 86, height 6' (1.829 m), weight 200 lb (90.7 kg).  Physical Exam  General: The patient is alert and cooperative at the time of the examination.  Skin: No significant peripheral edema is noted.   Neurologic Exam  Mental status: The patient is alert and oriented x 3 at the time of the examination. The patient has apparent normal recent and remote memory, with an apparently normal attention span and concentration ability.   Cranial nerves: Facial symmetry is present. Speech is normal, no aphasia or dysarthria is noted. Extraocular movements are full, with exception  that with rightward gaze, there is incomplete adduction of the left eye. Visual fields are full.  Pupils are equal, round, and reactive to light.  Discs are flat bilaterally.  Motor: The patient has relatively good strength in both upper extremities.  With the left lower extremity, there is 4 -/5 strength with hip flexion, 4/5 strength with knee flexion extension.  The right leg, there is 4+/5 strength throughout.  Sensory examination: Soft touch sensation is symmetric on the face, arms, and legs.  Coordination: The patient has some slight dysmetria finger-nose-finger bilaterally.  The patient has difficulty performing heel-to-shin bilaterally, worse on the left.  Gait and station: The patient was not ambulated, he is wheelchair-bound.  Reflexes: Deep tendon reflexes are symmetric.   Assessment/Plan:  1.  Multiple sclerosis  2.  Gait disorder  3.  Neurogenic bowel and bladder  The patient has had a recent change in bladder function.  I suppose this could be related to an MS exacerbation, we will check MRI of the thoracic spinal cord.  The patient will be following up with urology.  He will remain on Tecfidera, we will check blood work today.  Prescriptions were sent in for his armodafinil and for baclofen.  He will check back in 6 months.  Marlan Palau MD 08/10/2020 12:42 PM  [facility] [address] [place], [place] [address]  Phone [phone] Fax [phone]

## 2020-08-11 ENCOUNTER — Telehealth: Payer: Self-pay | Admitting: Neurology

## 2020-08-11 LAB — CBC WITH DIFFERENTIAL/PLATELET
Basophils Absolute: 0 10*3/uL (ref 0.0–0.2)
Basos: 1 %
EOS (ABSOLUTE): 0.1 10*3/uL (ref 0.0–0.4)
Eos: 2 %
Hematocrit: 38.8 % (ref 37.5–51.0)
Hemoglobin: 12.9 g/dL — ABNORMAL LOW (ref 13.0–17.7)
Immature Grans (Abs): 0.1 10*3/uL (ref 0.0–0.1)
Immature Granulocytes: 1 %
Lymphocytes Absolute: 2.1 10*3/uL (ref 0.7–3.1)
Lymphs: 35 %
MCH: 27.4 pg (ref 26.6–33.0)
MCHC: 33.2 g/dL (ref 31.5–35.7)
MCV: 83 fL (ref 79–97)
Monocytes Absolute: 0.4 10*3/uL (ref 0.1–0.9)
Monocytes: 6 %
Neutrophils Absolute: 3.5 10*3/uL (ref 1.4–7.0)
Neutrophils: 55 %
Platelets: 356 10*3/uL (ref 150–450)
RBC: 4.7 x10E6/uL (ref 4.14–5.80)
RDW: 12.3 % (ref 11.6–15.4)
WBC: 6.1 10*3/uL (ref 3.4–10.8)

## 2020-08-11 LAB — COMPREHENSIVE METABOLIC PANEL
ALT: 58 IU/L — ABNORMAL HIGH (ref 0–44)
AST: 51 IU/L — ABNORMAL HIGH (ref 0–40)
Albumin/Globulin Ratio: 1.3 (ref 1.2–2.2)
Albumin: 4.1 g/dL (ref 3.8–4.9)
Alkaline Phosphatase: 72 IU/L (ref 44–121)
BUN/Creatinine Ratio: 23 (ref 10–24)
BUN: 20 mg/dL (ref 8–27)
Bilirubin Total: 0.3 mg/dL (ref 0.0–1.2)
CO2: 26 mmol/L (ref 20–29)
Calcium: 9.5 mg/dL (ref 8.6–10.2)
Chloride: 102 mmol/L (ref 96–106)
Creatinine, Ser: 0.87 mg/dL (ref 0.76–1.27)
Globulin, Total: 3.1 g/dL (ref 1.5–4.5)
Glucose: 135 mg/dL — ABNORMAL HIGH (ref 65–99)
Potassium: 3.5 mmol/L (ref 3.5–5.2)
Sodium: 146 mmol/L — ABNORMAL HIGH (ref 134–144)
Total Protein: 7.2 g/dL (ref 6.0–8.5)
eGFR: 99 mL/min/{1.73_m2} (ref >59)

## 2020-08-11 NOTE — Telephone Encounter (Signed)
[facility]: [id number] (exp. 08/11/20 to 09/09/20) order sent to GI. They will reach out to the patient to schedule.

## 2020-08-16 ENCOUNTER — Other Ambulatory Visit: Payer: Self-pay

## 2020-08-16 ENCOUNTER — Ambulatory Visit
Admission: RE | Admit: 2020-08-16 | Discharge: 2020-08-16 | Disposition: A | Discharge status: Home / Self Care | Payer: 59 | Source: Ambulatory Visit | Attending: Neurology | Admitting: Neurology

## 2020-08-16 ENCOUNTER — Telehealth: Payer: Self-pay | Admitting: Neurology

## 2020-08-16 DIAGNOSIS — G35 Multiple sclerosis: Secondary | ICD-10-CM

## 2020-08-16 DIAGNOSIS — N319 Neuromuscular dysfunction of bladder, unspecified: Secondary | ICD-10-CM

## 2020-08-16 MED ORDER — PREDNISONE 10 MG PO TABS: ORAL_TABLET | ORAL | 0 refills | Status: DC

## 2020-08-16 MED ORDER — GADOBENATE DIMEGLUMINE 529 MG/ML IV SOLN
20.0000 mL | Freq: Once | INTRAVENOUS | Status: AC | PRN
  Administered 2020-08-16: 20 mL via INTRAVENOUS

## 2020-08-16 NOTE — Telephone Encounter (Signed)
I called the patient.  MRI of the thoracic spine does not include the conus medullaris.  However, the patient does have multiple T2 abnormalities along the thoracic spinal cord, it is possible that the recent change in bladder function could be related to an MS exacerbation.  I will give him a trial on prednisone over the next 2 weeks to see if his bladder function returns, he is actively being followed through urology.     MRI thoracic 08/16/20:  IMPRESSION: Scattered foci of abnormal T2 signal within the thoracic spinal cord. No cord compression. Findings are consistent with the clinical diagnosis of multiple sclerosis. No lesions show contrast enhancement. Therefore, acute versus chronic abnormality is not clearly discernible.

## 2020-08-24 NOTE — Telephone Encounter (Signed)
Certificate of Medical necessity for ME faxed to [employer] [facility] [employer]  OK transmission received

## 2020-09-08 ENCOUNTER — Other Ambulatory Visit: Payer: Self-pay | Admitting: Family Medicine

## 2020-09-08 DIAGNOSIS — I10 Essential (primary) hypertension: Secondary | ICD-10-CM

## 2020-09-09 NOTE — Telephone Encounter (Signed)
Certificate of Medical Necessity for ME regarding swing-away joystick mount faxed to [employer] [facility]  OK transmission received

## 2020-11-11 ENCOUNTER — Telehealth: Payer: Self-pay | Admitting: Neurology

## 2020-11-11 DIAGNOSIS — Z5181 Encounter for therapeutic drug level monitoring: Secondary | ICD-10-CM

## 2020-11-11 NOTE — Telephone Encounter (Signed)
I called the patient.  The patient is coming to get CBC and comprehensive metabolic profile on Tecfidera.

## 2020-11-12 ENCOUNTER — Other Ambulatory Visit: Payer: Self-pay | Admitting: Family Medicine

## 2020-11-12 DIAGNOSIS — I10 Essential (primary) hypertension: Secondary | ICD-10-CM

## 2020-11-16 ENCOUNTER — Other Ambulatory Visit: Payer: Self-pay

## 2020-11-16 ENCOUNTER — Other Ambulatory Visit (INDEPENDENT_AMBULATORY_CARE_PROVIDER_SITE_OTHER): Payer: Self-pay

## 2020-11-16 DIAGNOSIS — Z5181 Encounter for therapeutic drug level monitoring: Secondary | ICD-10-CM

## 2020-11-16 DIAGNOSIS — Z0289 Encounter for other administrative examinations: Secondary | ICD-10-CM

## 2020-11-17 LAB — COMPREHENSIVE METABOLIC PANEL
ALT: 12 IU/L (ref 0–44)
AST: 13 IU/L (ref 0–40)
Albumin/Globulin Ratio: 1.8 (ref 1.2–2.2)
Albumin: 4.3 g/dL (ref 3.8–4.9)
Alkaline Phosphatase: 67 IU/L (ref 44–121)
BUN/Creatinine Ratio: 21 (ref 10–24)
BUN: 20 mg/dL (ref 8–27)
Bilirubin Total: 0.5 mg/dL (ref 0.0–1.2)
CO2: 30 mmol/L — ABNORMAL HIGH (ref 20–29)
Calcium: 9.5 mg/dL (ref 8.6–10.2)
Chloride: 104 mmol/L (ref 96–106)
Creatinine, Ser: 0.96 mg/dL (ref 0.76–1.27)
Globulin, Total: 2.4 g/dL (ref 1.5–4.5)
Glucose: 143 mg/dL — ABNORMAL HIGH (ref 65–99)
Potassium: 3.5 mmol/L (ref 3.5–5.2)
Sodium: 146 mmol/L — ABNORMAL HIGH (ref 134–144)
Total Protein: 6.7 g/dL (ref 6.0–8.5)
eGFR: 90 mL/min/{1.73_m2} (ref >59)

## 2020-11-17 LAB — CBC WITH DIFFERENTIAL/PLATELET
Basophils Absolute: 0.1 10*3/uL (ref 0.0–0.2)
Basos: 1 %
EOS (ABSOLUTE): 0.2 10*3/uL (ref 0.0–0.4)
Eos: 3 %
Hematocrit: 39 % (ref 37.5–51.0)
Hemoglobin: 13 g/dL (ref 13.0–17.7)
Immature Grans (Abs): 0 10*3/uL (ref 0.0–0.1)
Immature Granulocytes: 0 %
Lymphocytes Absolute: 1.4 10*3/uL (ref 0.7–3.1)
Lymphs: 31 %
MCH: 27 pg (ref 26.6–33.0)
MCHC: 33.3 g/dL (ref 31.5–35.7)
MCV: 81 fL (ref 79–97)
Monocytes Absolute: 0.4 10*3/uL (ref 0.1–0.9)
Monocytes: 8 %
Neutrophils Absolute: 2.5 10*3/uL (ref 1.4–7.0)
Neutrophils: 57 %
Platelets: 183 10*3/uL (ref 150–450)
RBC: 4.81 x10E6/uL (ref 4.14–5.80)
RDW: 12.3 % (ref 11.6–15.4)
WBC: 4.4 10*3/uL (ref 3.4–10.8)

## 2021-02-12 ENCOUNTER — Other Ambulatory Visit: Payer: Self-pay | Admitting: Neurology

## 2021-02-12 DIAGNOSIS — G35 Multiple sclerosis: Secondary | ICD-10-CM

## 2021-02-14 ENCOUNTER — Encounter: Payer: Self-pay | Admitting: Adult Health

## 2021-02-14 ENCOUNTER — Ambulatory Visit: Payer: 59 | Admitting: Adult Health

## 2021-02-14 VITALS — BP 114/71 | HR 80 | Ht 72.0 in

## 2021-02-14 DIAGNOSIS — G35 Multiple sclerosis: Secondary | ICD-10-CM | POA: Diagnosis not present

## 2021-02-14 NOTE — Progress Notes (Signed)
PATIENT: Randy Shaw DOB: 1960/12/18  REASON FOR VISIT: follow up HISTORY FROM: patient   HISTORY OF PRESENT ILLNESS: Today 02/14/21 Randy Shaw  is a 60 y.o. male with a history of MS. He is here today for a follow up. He remains on the Tecfidera 240 mg twice daily and Baclofen 1 tab in the morning, 1 tab at noon, and 2 tabs at bedtime. He does not take the tizanidine at this time. He recently experienced some urinary retention in which he required a 2 week dose of prednisone. He endorses that his retention issues have resolved and overall his MS is doing well. He reports that he is mostly wheelchair bound and rarely walks. He is currently living with his son. Patient states he is having some LUE stiffness and pain with movement. He has not tried any OTC medications for this and was encouraged to do so. If pain and stiffness persist, encouraged patient to speak with his PCP for further workup.      HISTORY  Copied from Dr. Anne Hahn note 08/10/2020 Randy Shaw is a 60 year old right-handed black male with a history of multiple sclerosis.  The patient has a significant problem with weakness in the legs, left greater than right.  He has fallen on occasion when trying to transfer from the bed to the wheelchair.  He has noted over the last several weeks that he has had some problems with voiding the bladder, he has been to the emergency room on 2 occasions on 01 July 2020, and then again on 18 July 2020.  The patient had a Foley catheter placed for about a week each time, but he has not been able to void the bladder once the catheter was removed.  He is followed through urology, he is seen by Dr. Annabell Howells.  The patient denies any low back pain or pain down the legs on either side.  He does have some altered sensation of the bladder and bowel, he has some urgency with the bowels, this is chronic for him.  There is some question of benign prostate enlargement.  He has not noted any new numbness or weakness  of the arms or new vision changes, he does have chronic double vision when looking to the right.  He last had MRI of the brain and cervical spinal cord about a year ago.  He remains on Tecfidera.  REVIEW OF SYSTEMS: Out of a complete 14 system review of symptoms, the patient complains only of the following symptoms, and all other reviewed systems are negative.  ALLERGIES: Allergies  Allergen Reactions   Peanut-Containing Drug Products Shortness Of Breath   Zanaflex [Tizanidine Hcl] Shortness Of Breath    HOME MEDICATIONS: Outpatient Medications Prior to Visit  Medication Sig Dispense Refill   Armodafinil 150 MG tablet Take 1 tablet (150 mg total) by mouth daily. 90 tablet 1   baclofen (LIORESAL) 20 MG tablet TAKE 1 TABLET BY MOUTH IN THE MORNING, 1 TABLET AT NOON, AND 2 TABLETS AT NIGHT 360 tablet 3   losartan-hydrochlorothiazide (HYZAAR) 50-12.5 MG tablet Take 1 tablet by mouth daily.     MYRBETRIQ 50 MG TB24 tablet      tamsulosin (FLOMAX) 0.4 MG CAPS capsule Take 1 capsule (0.4 mg total) by mouth daily. 30 capsule 0   TECFIDERA 240 MG CPDR Take 1 capsule (240 mg total) by mouth 2 (two) times daily. 60 capsule 11   tiZANidine (ZANAFLEX) 2 MG tablet Take 1 tablet (2 mg total) by  mouth every 8 (eight) hours as needed for muscle spasms. 30 tablet 0   hydrochlorothiazide (MICROZIDE) 12.5 MG capsule TAKE 1 CAPSULE(12.5 MG) BY MOUTH DAILY 30 capsule 0   predniSONE (DELTASONE) 10 MG tablet Begin taking 6 tablets daily, taper by one tablet every other day until off the medication. 42 tablet 0   No facility-administered medications prior to visit.    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    PAST SURGICAL HISTORY: Past Surgical History:  Procedure Laterality Date   none      FAMILY  HISTORY: Family History  Problem Relation Age of Onset   Cancer Mother        breast   Stroke Father    Cancer - Colon Father    Multiple sclerosis Neg Hx     SOCIAL HISTORY: Social History   Socioeconomic History   Marital status: Divorced    Spouse name: n/a   Number of children: 3   Years of education: 12   Highest education level: Not on file  Occupational History   Occupation: disabled/retired 1999-[place]    Comment: Event organiser  Tobacco Use   Smoking status: Never   Smokeless tobacco: Never  Substance and Sexual Activity   Alcohol use: Not Currently    Alcohol/week: 0.0 standard drinks    Comment: occasionally wine cooler   Drug use: No   Sexual activity: Never  Other Topics Concern   Not on file  Social History Narrative   Lives alone, house.     Right Handed   Drinks 2-3 cups caffeine   Social Determinants of Health   Financial Resource Strain: Not on file  Food Insecurity: Not on file  Transportation Needs: Not on file  Physical Activity: Not on file  Stress: Not on file  Social Connections: Not on file  Intimate Partner Violence: Not on file      PHYSICAL EXAM  Vitals:   02/14/21 1252  Height: 6' (1.829 m)   Body mass index is 27.12 kg/m.  Generalized: Well developed, in no acute distress   Neurological examination  Mentation: Alert oriented to time, place, history taking. Follows all commands speech and language fluent Cranial nerve II-XII: Pupils were equal round reactive to light. Extraocular movements were full, visual field were full on confrontational test. Facial sensation and strength were normal. Uvula tongue midline. Head turning and shoulder shrug  were normal and symmetric. Motor: The motor testing reveals 5/5 strength in RUE and RLE and 4/5 on the LUE and LLE. Good motor tone in the upper extrmeitites and the RLE, Increased  motor tone in the LLE. Foot drop noted to the LLE. Sensory: Sensory testing is intact to soft  touch on all 4 extremities. No evidence of extinction is noted.  Coordination: Cerebellar testing reveals ataxia bilaterally in the finger-nose-finger. The patient has difficulty performing heel-to-shin bilaterally, worse on the left. Gait and station: Not ambulated, patient is wheelchair bound.  Reflexes: Deep tendon reflexes are symmetric and normal bilaterally.   DIAGNOSTIC DATA (LABS, IMAGING, TESTING) - I reviewed patient records, labs, notes, testing and imaging myself where available.  Lab Results  Component Value Date   WBC 4.4 11/16/2020   HGB 13.0 11/16/2020   HCT 39.0 11/16/2020   MCV 81 11/16/2020  PLT 183 11/16/2020      Component Value Date/Time   NA 146 (H) 11/16/2020 1340   K 3.5 11/16/2020 1340   CL 104 11/16/2020 1340   CO2 30 (H) 11/16/2020 1340   GLUCOSE 143 (H) 11/16/2020 1340   GLUCOSE 154 (H) 07/01/2020 2342   BUN 20 11/16/2020 1340   CREATININE 0.96 11/16/2020 1340   CREATININE 0.89 07/30/2013 0952   CALCIUM 9.5 11/16/2020 1340   PROT 6.7 11/16/2020 1340   ALBUMIN 4.3 11/16/2020 1340   AST 13 11/16/2020 1340   ALT 12 11/16/2020 1340   ALKPHOS 67 11/16/2020 1340   BILITOT 0.5 11/16/2020 1340   GFRNONAA >60 07/01/2020 2342   GFRNONAA >89 07/30/2013 0952   GFRAA 107 02/05/2020 1144   GFRAA >89 07/30/2013 0952   Lab Results  Component Value Date   CHOL 233 (H) 01/01/2019   HDL 67 01/01/2019   LDLCALC 149 (H) 01/01/2019   TRIG 98 01/01/2019   CHOLHDL 3.5 01/01/2019   Lab Results  Component Value Date   HGBA1C 5.4 10/27/2019   Lab Results  Component Value Date   VITAMINB12 343 07/06/2017   Lab Results  Component Value Date   TSH 3.570 07/06/2017      ASSESSMENT AND PLAN 60 y.o. year old male  has a past medical history of Abnormality of gait (04/22/2013), Asthma, BPH (benign prostatic hyperplasia), Hypertension, Multiple sclerosis (HCC), and Quadriplegia and quadriparesis (HCC) (03/08/2016). here with .    1. Multiple sclerosis  -  Continue Tecfidera 240 mg twice daily. -Continue Baclofen 20 mg 1 tab in the morning, 1 tab at noon, and 2 tabs at bedtime. -Repeat imaging in 2023 -Follow up in 6 months or sooner if needed.   Butch Penny, MSN, NP-C 02/14/2021, 12:59 PM [facility] [address], [address] [place], [place] [address] [phone]

## 2021-03-03 ENCOUNTER — Telehealth: Payer: Self-pay | Admitting: *Deleted

## 2021-03-03 ENCOUNTER — Observation Stay (HOSPITAL_COMMUNITY)
Admission: EM | Admit: 2021-03-03 | Discharge: 2021-03-04 | Disposition: A | Discharge status: Home / Self Care | Payer: 59 | Attending: Internal Medicine | Admitting: Internal Medicine

## 2021-03-03 ENCOUNTER — Emergency Department (HOSPITAL_COMMUNITY): Payer: 59

## 2021-03-03 ENCOUNTER — Observation Stay (HOSPITAL_COMMUNITY): Payer: 59

## 2021-03-03 DIAGNOSIS — R569 Unspecified convulsions: Principal | ICD-10-CM

## 2021-03-03 DIAGNOSIS — Z79899 Other long term (current) drug therapy: Secondary | ICD-10-CM | POA: Insufficient documentation

## 2021-03-03 DIAGNOSIS — Z91010 Allergy to peanuts: Secondary | ICD-10-CM | POA: Diagnosis not present

## 2021-03-03 DIAGNOSIS — Y900 Blood alcohol level of less than 20 mg/100 ml: Secondary | ICD-10-CM | POA: Diagnosis not present

## 2021-03-03 DIAGNOSIS — J45909 Unspecified asthma, uncomplicated: Secondary | ICD-10-CM | POA: Diagnosis not present

## 2021-03-03 DIAGNOSIS — R464 Slowness and poor responsiveness: Secondary | ICD-10-CM | POA: Diagnosis present

## 2021-03-03 DIAGNOSIS — I10 Essential (primary) hypertension: Secondary | ICD-10-CM | POA: Diagnosis not present

## 2021-03-03 DIAGNOSIS — U071 COVID-19: Secondary | ICD-10-CM | POA: Diagnosis not present

## 2021-03-03 LAB — CBC WITH DIFFERENTIAL/PLATELET
Abs Immature Granulocytes: 0.01 10*3/uL (ref 0.00–0.07)
Basophils Absolute: 0 10*3/uL (ref 0.0–0.1)
Basophils Relative: 1 %
Eosinophils Absolute: 0.2 10*3/uL (ref 0.0–0.5)
Eosinophils Relative: 3 %
HCT: 38.3 % — ABNORMAL LOW (ref 39.0–52.0)
Hemoglobin: 12.4 g/dL — ABNORMAL LOW (ref 13.0–17.0)
Immature Granulocytes: 0 %
Lymphocytes Relative: 40 %
Lymphs Abs: 2.5 10*3/uL (ref 0.7–4.0)
MCH: 27.9 pg (ref 26.0–34.0)
MCHC: 32.4 g/dL (ref 30.0–36.0)
MCV: 86.1 fL (ref 80.0–100.0)
Monocytes Absolute: 0.5 10*3/uL (ref 0.1–1.0)
Monocytes Relative: 9 %
Neutro Abs: 3 10*3/uL (ref 1.7–7.7)
Neutrophils Relative %: 47 %
Platelets: 224 10*3/uL (ref 150–400)
RBC: 4.45 MIL/uL (ref 4.22–5.81)
RDW: 13 % (ref 11.5–15.5)
WBC: 6.3 10*3/uL (ref 4.0–10.5)
nRBC: 0 % (ref 0.0–0.2)

## 2021-03-03 LAB — D-DIMER, QUANTITATIVE: D-Dimer, Quant: 0.57 ug/mL-FEU — ABNORMAL HIGH (ref 0.00–0.50)

## 2021-03-03 LAB — HIV ANTIBODY (ROUTINE TESTING W REFLEX): HIV Screen 4th Generation wRfx: NONREACTIVE

## 2021-03-03 LAB — COMPREHENSIVE METABOLIC PANEL
ALT: 24 U/L (ref 0–44)
AST: 24 U/L (ref 15–41)
Albumin: 3.7 g/dL (ref 3.5–5.0)
Alkaline Phosphatase: 54 U/L (ref 38–126)
Anion gap: 9 (ref 5–15)
BUN: 10 mg/dL (ref 8–23)
CO2: 28 mmol/L (ref 22–32)
Calcium: 9.3 mg/dL (ref 8.9–10.3)
Chloride: 104 mmol/L (ref 98–111)
Creatinine, Ser: 0.99 mg/dL (ref 0.61–1.24)
GFR, Estimated: >60 mL/min (ref >60)
Glucose, Bld: 101 mg/dL — ABNORMAL HIGH (ref 70–99)
Potassium: 3.5 mmol/L (ref 3.5–5.1)
Sodium: 141 mmol/L (ref 135–145)
Total Bilirubin: 0.7 mg/dL (ref 0.3–1.2)
Total Protein: 6.5 g/dL (ref 6.5–8.1)

## 2021-03-03 LAB — TROPONIN I (HIGH SENSITIVITY)
Troponin I (High Sensitivity): 7 ng/L (ref <18)
Troponin I (High Sensitivity): 6 ng/L (ref <18)

## 2021-03-03 LAB — URINALYSIS, ROUTINE W REFLEX MICROSCOPIC
Bilirubin Urine: NEGATIVE
Glucose, UA: NEGATIVE mg/dL
Hgb urine dipstick: NEGATIVE
Ketones, ur: NEGATIVE mg/dL
Leukocytes,Ua: NEGATIVE
Nitrite: NEGATIVE
Protein, ur: NEGATIVE mg/dL
Specific Gravity, Urine: 1.013 (ref 1.005–1.030)
pH: 6 (ref 5.0–8.0)

## 2021-03-03 LAB — RAPID URINE DRUG SCREEN, HOSP PERFORMED
Amphetamines: NOT DETECTED
Barbiturates: NOT DETECTED
Benzodiazepines: NOT DETECTED
Cocaine: NOT DETECTED
Opiates: NOT DETECTED
Tetrahydrocannabinol: NOT DETECTED

## 2021-03-03 LAB — TSH: TSH: 1.582 u[IU]/mL (ref 0.350–4.500)

## 2021-03-03 LAB — PROCALCITONIN: Procalcitonin: <0.1 ng/mL

## 2021-03-03 LAB — CBG MONITORING, ED: Glucose-Capillary: 121 mg/dL — ABNORMAL HIGH (ref 70–99)

## 2021-03-03 LAB — ETHANOL: Alcohol, Ethyl (B): <10 mg/dL (ref <10)

## 2021-03-03 LAB — LACTIC ACID, PLASMA
Lactic Acid, Venous: 1.5 mmol/L (ref 0.5–1.9)
Lactic Acid, Venous: 1.3 mmol/L (ref 0.5–1.9)

## 2021-03-03 LAB — RESP PANEL BY RT-PCR (FLU A&B, COVID) ARPGX2
Influenza A by PCR: NEGATIVE
Influenza B by PCR: NEGATIVE
SARS Coronavirus 2 by RT PCR: POSITIVE — AB

## 2021-03-03 LAB — LACTATE DEHYDROGENASE: LDH: 149 U/L (ref 98–192)

## 2021-03-03 LAB — FIBRINOGEN: Fibrinogen: 306 mg/dL (ref 210–475)

## 2021-03-03 LAB — FERRITIN: Ferritin: 124 ng/mL (ref 24–336)

## 2021-03-03 MED ORDER — TAMSULOSIN HCL 0.4 MG PO CAPS
0.4000 mg | ORAL_CAPSULE | Freq: Every day | ORAL | Status: DC
  Administered 2021-03-04: 0.4 mg via ORAL
  Filled 2021-03-03 (×2): qty 1

## 2021-03-03 MED ORDER — ENOXAPARIN SODIUM 40 MG/0.4ML IJ SOSY
40.0000 mg | PREFILLED_SYRINGE | INTRAMUSCULAR | Status: DC
  Administered 2021-03-03: 40 mg via SUBCUTANEOUS
  Filled 2021-03-03: qty 0.4

## 2021-03-03 MED ORDER — DIMETHYL FUMARATE 240 MG PO CPDR: 1.0000 | DELAYED_RELEASE_CAPSULE | Freq: Two times a day (BID) | ORAL | Status: DC

## 2021-03-03 MED ORDER — TIZANIDINE HCL 4 MG PO TABS: 2.0000 mg | ORAL_TABLET | Freq: Three times a day (TID) | ORAL | Status: DC | PRN

## 2021-03-03 MED ORDER — MOLNUPIRAVIR EUA 200MG CAPSULE: 4.0000 | ORAL_CAPSULE | Freq: Two times a day (BID) | ORAL | 0 refills | Status: AC

## 2021-03-03 MED ORDER — SODIUM CHLORIDE 0.9 % IV SOLN
75.0000 mL/h | INTRAVENOUS | Status: DC
  Administered 2021-03-03: 75 mL/h via INTRAVENOUS

## 2021-03-03 MED ORDER — CHLORHEXIDINE GLUCONATE 0.12% ORAL RINSE (MEDLINE KIT): 15.0000 mL | Freq: Two times a day (BID) | OROMUCOSAL | Status: DC

## 2021-03-03 MED ORDER — HYDROCHLOROTHIAZIDE 12.5 MG PO TABS
12.5000 mg | ORAL_TABLET | Freq: Every day | ORAL | Status: DC
  Administered 2021-03-04: 12.5 mg via ORAL
  Filled 2021-03-03 (×2): qty 1

## 2021-03-03 MED ORDER — BACLOFEN 20 MG PO TABS
20.0000 mg | ORAL_TABLET | Freq: Every day | ORAL | Status: DC
  Filled 2021-03-03: qty 1

## 2021-03-03 MED ORDER — LOSARTAN POTASSIUM-HCTZ 50-12.5 MG PO TABS: 1.0000 | ORAL_TABLET | Freq: Every day | ORAL | Status: DC

## 2021-03-03 MED ORDER — LACTATED RINGERS IV BOLUS
1000.0000 mL | Freq: Once | INTRAVENOUS | Status: AC
  Administered 2021-03-03: 1000 mL via INTRAVENOUS

## 2021-03-03 MED ORDER — ARMODAFINIL 150 MG PO TABS: 150.0000 mg | ORAL_TABLET | Freq: Every day | ORAL | Status: DC

## 2021-03-03 MED ORDER — LOSARTAN POTASSIUM 50 MG PO TABS
50.0000 mg | ORAL_TABLET | Freq: Every day | ORAL | Status: DC
  Administered 2021-03-04: 50 mg via ORAL
  Filled 2021-03-03 (×2): qty 1

## 2021-03-03 MED ORDER — ORAL CARE MOUTH RINSE: 15.0000 mL | OROMUCOSAL | Status: DC

## 2021-03-03 MED ORDER — MIRABEGRON ER 25 MG PO TB24
25.0000 mg | ORAL_TABLET | Freq: Every day | ORAL | Status: DC
  Filled 2021-03-03 (×2): qty 1

## 2021-03-03 NOTE — Discharge Instructions (Addendum)
All the blood work today, CAT scan of the head and chest x-ray were normal.  You did test positive for COVID and you are given a prescription for the COVID medication but I would only recommend taking that if you start having symptoms.  It will be important for you to follow-up with the neurologist so you can have EEG testing done.  If you have any further signs of seizures return to the emergency room.

## 2021-03-03 NOTE — Progress Notes (Signed)
EEG complete - results pending 

## 2021-03-03 NOTE — ED Notes (Signed)
Pt family member requesting that another family member be contacted by MD regarding pt care. Pt consented to RN that family member be updated. MD Plunkett notified.

## 2021-03-03 NOTE — Telephone Encounter (Signed)
[employer] access [place] form completed, to MM/NP to review and signature.

## 2021-03-03 NOTE — ED Provider Notes (Addendum)
[facility] EMERGENCY DEPARTMENT Provider Note   CSN: [id number] Arrival date & time: 03/03/21  0719     History Chief Complaint  Patient presents with   Unresponsive     Randy Shaw is a 61 y.o. male.  Patient is a 60 year old male with a history of MS, quadriplegia and wheelchair-bound, hypertension who is presenting today with paramedics due to being found unresponsive in his bed.  His son called 911 when he went to check on him this morning and he could not be aroused.  He had sonorous respirations and blood around his face.  Son is not currently present for further history.  EMS reports that when they arrived patient was not responsive to voice or painful stimuli but was breathing spontaneously.  His oxygen saturation has been normal throughout transit and heart rate has been in the 120 range.  Patient has started become more responsive since they arrived.  He is now starting to pull on things and will respond to his name but is still altered which per his family is not baseline.  No other history is present at this time.  EMS did not note a history of seizures.  The history is provided by medical records and the EMS personnel. The history is limited by the condition of the patient.      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   Abnormality of gait 04/22/2013   HTN (hypertension) 06/29/2012   Multiple sclerosis (HCC) 06/29/2012   Benign prostatic hyperplasia with urinary obstruction 06/29/2012    Past Surgical History:  Procedure Laterality Date   none         Family History  Problem Relation Age of Onset   Cancer Mother        breast   Stroke Father    Cancer -  Colon Father    Multiple sclerosis Neg Hx     Social History   Tobacco Use   Smoking status: Never   Smokeless tobacco: Never  Substance Use Topics   Alcohol use: Not Currently    Alcohol/week: 0.0 standard drinks    Comment: occasionally wine cooler   Drug use: No    Home Medications Prior to Admission medications   Medication Sig Start Date End Date Taking? Authorizing Provider  Armodafinil 150 MG tablet Take 1 tablet (150 mg total) by mouth daily. 08/10/20   York Spaniel, MD  baclofen (LIORESAL) 20 MG tablet TAKE 1 TABLET BY MOUTH IN THE MORNING, 1 TABLET AT NOON, AND 2 TABLETS AT NIGHT 08/10/20   York Spaniel, MD  losartan-hydrochlorothiazide (HYZAAR) 50-12.5 MG tablet Take 1 tablet by mouth daily. 01/12/21   [provider]  MYRBETRIQ 50 MG TB24 tablet  02/16/19   [provider]  tamsulosin (FLOMAX) 0.4 MG CAPS capsule Take 1 capsule (0.4 mg total) by mouth daily. 07/01/14   Tonye Pearson, MD  TECFIDERA 240 MG CPDR TAKE 1 CAPSULE BY MOUTH TWICE A DAY 02/14/21   Butch Penny, NP  tiZANidine (ZANAFLEX) 2 MG tablet Take 1 tablet (2 mg total) by mouth every 8 (eight) hours as needed for muscle spasms. 02/05/20   Glean Salvo, NP  Allergies    Peanut-containing drug products and Zanaflex [tizanidine hcl]  Review of Systems   Review of Systems  All other systems reviewed and are negative.  Physical Exam Updated Vital Signs BP 134/87   Pulse (!) 102   Temp 98.4 F (36.9 C) (Oral)   Resp 14   SpO2 96%   Physical Exam Vitals and nursing note reviewed.  Constitutional:      General: He is not in acute distress.    Appearance: He is well-developed.  HENT:     Head: Normocephalic and atraumatic.     Right Ear: Tympanic membrane normal.     Left Ear: Tympanic membrane normal.     Nose: Nose normal.     Mouth/Throat:     Comments: Blood present around the lips and mouth no active bleeding identified.  Patient not following instruction  and difficult to evaluate the tongue at this time Eyes:     Conjunctiva/sclera: Conjunctivae normal.     Pupils: Pupils are equal, round, and reactive to light.  Cardiovascular:     Rate and Rhythm: Regular rhythm. Tachycardia present.     Heart sounds: No murmur heard. Pulmonary:     Effort: Pulmonary effort is normal. No respiratory distress.     Breath sounds: Normal breath sounds. No wheezing or rales.     Comments: Minimal abrasions noted over the chest/abdomen Abdominal:     General: There is no distension.     Palpations: Abdomen is soft.     Tenderness: There is no abdominal tenderness. There is no guarding or rebound.  Musculoskeletal:        General: No tenderness. Normal range of motion.     Cervical back: Normal range of motion and neck supple.  Skin:    General: Skin is warm and dry.     Findings: No erythema or rash.  Neurological:     Mental Status: He is alert.     Comments: Patient is awake and agitated.  He occasionally will follow commands.  He is noted to move bilateral upper extremity without difficulty and purposefully.  Minimal movement of the lower extremities.  Psychiatric:     Comments: Mildly agitated    ED Results / Procedures / Treatments   Labs (all labs ordered are listed, but only abnormal results are displayed) Labs Reviewed  RESP PANEL BY RT-PCR (FLU A&B, COVID) ARPGX2 - Abnormal; Notable for the following components:      Result Value   SARS Coronavirus 2 by RT PCR POSITIVE (*)    All other components within normal limits  CBC WITH DIFFERENTIAL/PLATELET - Abnormal; Notable for the following components:   Hemoglobin 12.4 (*)    HCT 38.3 (*)    All other components within normal limits  COMPREHENSIVE METABOLIC PANEL - Abnormal; Notable for the following components:   Glucose, Bld 101 (*)    All other components within normal limits  CBG MONITORING, ED - Abnormal; Notable for the following components:   Glucose-Capillary 121 (*)    All  other components within normal limits  ETHANOL  RAPID URINE DRUG SCREEN, HOSP PERFORMED  URINALYSIS, ROUTINE W REFLEX MICROSCOPIC    EKG EKG Interpretation  Date/Time:  Thursday March 03 2021 07:36:28 EDT Ventricular Rate:  125 PR Interval:  165 QRS Duration: 85 QT Interval:  343 QTC Calculation: 495 R Axis:   -18 Text Interpretation: Sinus tachycardia Probable left atrial enlargement Borderline left axis deviation Abnormal R-wave progression, early transition Borderline T wave  abnormalities Borderline prolonged QT interval No previous tracing Confirmed by Gwyneth Sprout ([id number]) on 03/03/2021 7:57:58 AM  Radiology CT Head Wo Contrast  Result Date: 03/03/2021 CLINICAL DATA:  A 60 year old male presents with mental status changes of unknown cause. EXAM: CT HEAD WITHOUT CONTRAST TECHNIQUE: Contiguous axial images were obtained from the base of the skull through the vertex without intravenous contrast. COMPARISON:  MRI of the brain of November of 2021. FINDINGS: Brain: No evidence of acute infarction, hemorrhage, hydrocephalus, extra-axial collection or mass lesion/mass effect. Signs of atrophy and chronic microvascular ischemic change with periventricular white matter changes in this patient with reported history of multiple sclerosis. Grossly similar compared to previous MRI. Vascular: No hyperdense vessel or unexpected calcification. Skull: Normal. Negative for fracture or focal lesion. Sinuses/Orbits: Visualized paranasal sinuses and orbits are unremarkable. Other: None IMPRESSION: No acute intracranial abnormality. Signs of atrophy and chronic microvascular ischemic change with periventricular white matter changes in this patient with reported history of multiple sclerosis. Grossly similar compared to previous MRI. Electronically Signed   By: Donzetta Kohut M.D.   On: 03/03/2021 08:13   DG Chest Port 1 View  Result Date: 03/03/2021 CLINICAL DATA:  Altered mental status EXAM: PORTABLE  CHEST 1 VIEW COMPARISON:  None. FINDINGS: Heart size and mediastinal contours are within normal limits. No suspicious pulmonary opacities identified. No pleural effusion or pneumothorax visualized. No acute osseous abnormality appreciated. IMPRESSION: No acute intrathoracic process identified. Electronically Signed   By: Jannifer Hick M.D.   On: 03/03/2021 07:55    Procedures Procedures   Medications Ordered in ED Medications  lactated ringers bolus 1,000 mL (0 mLs Intravenous Stopped 03/03/21 0950)    ED Course  I have reviewed the triage vital signs and the nursing notes.  Pertinent labs & imaging results that were available during my care of the patient were reviewed by me and considered in my medical decision making (see chart for details).    MDM Rules/Calculators/A&P                           60 year old male presenting today after being found unresponsive in his bed.  Initially unresponsive for EMS but patient is now awake but mildly agitated and not following commands consistently.  Patient does have blood around his mouth and suspect tongue biting.  Concerned that patient may have had an unwitnessed seizure.  Does have a history of MS but no reported seizure history.  He takes baclofen and Tecfidera and several weeks ago have been on prednisone but no other new medications.  Patient is afebrile here but will evaluate for any infectious causes that would lead to seizure.  Also will do head CT.  We will continue to monitor patient to ensure he returns to baseline mental status which is alert and oriented x3 with no cognitive disability.  10:33 AM Pt is now awake and alert and reports he feels fine.  No recent illness.  No other c/o.  No prior hx of seizures.  Labs reassuring today except pt tested positive for covid.  No signs of UTI or PNA.  Will discuss with neurology.  Pt is asymptomatic with covid at this tiime.   11:18 AM Neurology (Dr. Jerrell Belfast) recommended outpt follow up but  no anticonvulsants at this time.  12:17 PM Spoke with patient's cousin Randy Shaw who reports concerns about patient going home related to his safety and possibility of having another seizure and injuring himself.  She  still does not feel that patient is completely at his baseline.  Patient's son is also concerned.  Discussed with the patient and will talk with the hospitalist but did not guarantee admission.  MDM   Amount and/or Complexity of Data Reviewed Clinical lab tests: ordered and reviewed Tests in the radiology section of CPT: ordered and reviewed Tests in the medicine section of CPT: reviewed and ordered Independent visualization of images, tracings, or specimens: yes     Final Clinical Impression(s) / ED Diagnoses Final diagnoses:  New onset seizure (HCC)  COVID    Rx / DC Orders ED Discharge Orders          Ordered    molnupiravir EUA (LAGEVRIO) 200 mg CAPS capsule  2 times daily        03/03/21 1120             Gwyneth Sprout, MD 03/03/21 1122    Gwyneth Sprout, MD 03/03/21 1239

## 2021-03-03 NOTE — Procedures (Signed)
Patient Name: BAILEY KOLBE  MRN: [id number]  Epilepsy Attending: Charlsie Quest  Referring Physician/Provider: Dr Mikey College Date: 03/03/2021 Duration: 22.45 mis  Patient history: 60yo M with new onset seizure. EEG to evaluate for seizure  Level of alertness: Awake  AEDs during EEG study: None  Technical aspects: This EEG study was done with scalp electrodes positioned according to the 10-20 International system of electrode placement. Electrical activity was acquired at a sampling rate of 500Hz  and reviewed with a high frequency filter of 70Hz  and a low frequency filter of 1Hz . EEG data were recorded continuously and digitally stored.   Description: No posterior dominant rhythm was seen. There is an excessive amount of 15 to 18 Hz beta activity distributed symmetrically and diffusely. Physiologic photic driving was not seen during photic stimulation.  Hyperventilation was not performed.     ABNORMALITY - Excessive beta, generalized  IMPRESSION: This study is within normal limits. No seizures or epileptiform discharges were seen throughout the recording. The excessive beta activity seen in the background is most likely due to the effect of medications like benzodiazepine and is a benign EEG pattern.  Baltasar Twilley 

## 2021-03-03 NOTE — ED Triage Notes (Signed)
Pt found down unresponsive by family member. Per EMS pt has mouth trauma and was contracted upon arrival . Pt became responsive while en route to hospital, PT A & O x 1 (self).   Hx of MS.

## 2021-03-03 NOTE — Progress Notes (Signed)
Urinary retention of bladder scan of 827 ml, straight cath x1.  PVR Q shift for one day.

## 2021-03-03 NOTE — ED Notes (Signed)
Informed Dr. Chipper Herb of son's concern that pt has some trouble urinating. Pt urinated twice 4 hours ago per son.

## 2021-03-03 NOTE — ED Notes (Signed)
Pt had stool. Pt cleaned, depends changed.

## 2021-03-03 NOTE — H&P (Signed)
History and Physical    Randy Shaw KDX:[id number] DOB: 1960-11-25 DOA: 03/03/2021  PCP: Renford Dills, MD (Confirm with patient/family/NH records and if not entered, this has to be entered at [facility] point of entry) Patient coming from: Home  I have personally briefly reviewed patient's old medical records in [facility]  Chief Complaint: Feeling ok  HPI: Randy Shaw is a 60 y.o. male with medical history significant of MS in remission with chronic left leg weakness and ambulation dysfunction, HTN, BPH, presented with new onset of seizure.  Patient reported he has been feeling fine this morning then it appeared that the patient loss of consciousness of the fall on the floor.  Unsure about how much time LOC was.  He was able to recover consciousness himself then called his son, son at bedside reporting patient was very confused when calling.  Patient did admit biting right side of his tongue and buccal mucosa, no loss control of bowel movement or urine incontinence.  He denies any cough, no shortness of breath no fever chills.  No numbness or weakness of any of the limbs.  No new medication recently.  He received COVID vaccination x1 last year.   EMS arrived and found patient was awake but very confused. Vital sign and finger stick normal.  ED Course: Patient was noted to still have postictal confusion, borderline tachycardia.  Afebrile.  COVID-positive, CBC, BMP within normal limits. UDS negative.  Alcohol negative.  Neurology consult, recommended no antiseizure treatment.  Review of Systems: As per HPI otherwise 14 point review of systems negative.    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 He has  never used smokeless tobacco. He reports that he does not currently use alcohol. He reports that he does not use drugs.  Allergies  Allergen Reactions   Peanut-Containing Drug Products Shortness Of Breath   Zanaflex [Tizanidine Hcl] Shortness Of Breath    Family History  Problem Relation Age of Onset   Cancer Mother        breast   Stroke Father    Cancer - Colon Father    Multiple sclerosis Neg Hx      Prior to Admission medications   Medication Sig Start Date End Date Taking? Authorizing Provider  molnupiravir EUA (LAGEVRIO) 200 mg CAPS capsule Take 4 capsules (800 mg total) by mouth 2 (two) times daily for 5 days. 03/03/21 03/08/21 Yes Plunkett, Alphonzo Lemmings, MD  Armodafinil 150 MG tablet Take 1 tablet (150 mg total) by mouth daily. 08/10/20   York Spaniel, MD  baclofen (LIORESAL) 20 MG tablet TAKE 1 TABLET BY MOUTH IN THE MORNING, 1 TABLET AT NOON, AND 2 TABLETS AT NIGHT 08/10/20   York Spaniel, MD  losartan-hydrochlorothiazide (HYZAAR) 50-12.5 MG tablet Take 1 tablet by mouth daily. 01/12/21   [provider]  MYRBETRIQ 50 MG TB24 tablet  02/16/19   [provider]  tamsulosin (FLOMAX) 0.4 MG CAPS capsule Take 1 capsule (0.4 mg total) by mouth daily. 07/01/14   Tonye Pearson, MD  TECFIDERA 240 MG CPDR TAKE 1 CAPSULE BY MOUTH TWICE A DAY 02/14/21   Butch Penny, NP  tiZANidine (ZANAFLEX) 2 MG tablet Take 1 tablet (2 mg total) by mouth every 8 (eight) hours as needed for muscle spasms. 02/05/20   Glean Salvo, NP    Physical Exam: Vitals:   03/03/21 1045 03/03/21 1115 03/03/21 1208 03/03/21 1230  BP: 132/88 124/89 136/90 (!) 130/91  Pulse: (!) 106 (!) 109 (!) 109 (!) 103  Resp: 18 (!) 25 13 (!) 21  Temp:      TempSrc:      SpO2: 99% 96% 95% 96%    Constitutional: NAD, calm, comfortable Vitals:   03/03/21 1045 03/03/21 1115 03/03/21 1208 03/03/21 1230  BP: 132/88 124/89 136/90 (!) 130/91  Pulse: (!) 106 (!) 109 (!) 109 (!) 103  Resp: 18 (!) 25  13 (!) 21  Temp:      TempSrc:      SpO2: 99% 96% 95% 96%   Eyes: PERRL, lids and conjunctivae normal ENMT: Mucous membranes are moist. Posterior pharynx clear of any exudate or lesions.Normal dentition.  Biting mark on right-sided buccal mucosa Neck: normal, supple, no masses, no thyromegaly Respiratory: clear to auscultation bilaterally, no wheezing, no crackles. Normal respiratory effort. No accessory muscle use.  Cardiovascular: Regular rate and rhythm, no murmurs / rubs / gallops. No extremity edema. 2+ pedal pulses. No carotid bruits.  Abdomen: no tenderness, no masses palpated. No hepatosplenomegaly. Bowel sounds positive.  Musculoskeletal: no clubbing / cyanosis. No joint deformity upper and lower extremities. Good ROM, no contractures. Normal muscle tone.  Skin: no rashes, lesions, ulcers. No induration Neurologic: CN 2-12 grossly intact. Sensation intact, DTR normal. Strength 4/5 in left leg compared to 5/5 on the right leg, which is chronic psychiatric: Normal judgment and insight. Alert and oriented x 3. Normal mood.     Labs on Admission: I have personally reviewed following labs and imaging studies  CBC: Recent Labs  Lab 03/03/21 0815  WBC 6.3  NEUTROABS 3.0  HGB 12.4*  HCT 38.3*  MCV 86.1  PLT 224   Basic Metabolic Panel: Recent Labs  Lab 03/03/21 0815  NA 141  K 3.5  CL 104  CO2 28  GLUCOSE 101*  BUN 10  CREATININE 0.99  CALCIUM 9.3   GFR: CrCl cannot be calculated (Unknown ideal weight.). Liver Function Tests: Recent Labs  Lab 03/03/21 0815  AST 24  ALT 24  ALKPHOS 54  BILITOT 0.7  PROT 6.5  ALBUMIN 3.7   No results for input(s): LIPASE, AMYLASE in the last 168 hours. No results for input(s): AMMONIA in the last 168 hours. Coagulation Profile: No results for input(s): INR, PROTIME in the last 168 hours. Cardiac Enzymes: No results for input(s): CKTOTAL, CKMB, CKMBINDEX, TROPONINI in the last 168 hours. BNP (last 3 results) No results for  input(s): PROBNP in the last 8760 hours. HbA1C: No results for input(s): HGBA1C in the last 72 hours. CBG: Recent Labs  Lab 03/03/21 0727  GLUCAP 121*   Lipid Profile: No results for input(s): CHOL, HDL, LDLCALC, TRIG, CHOLHDL, LDLDIRECT in the last 72 hours. Thyroid Function Tests: No results for input(s): TSH, T4TOTAL, FREET4, T3FREE, THYROIDAB in the last 72 hours. Anemia Panel: No results for input(s): VITAMINB12, FOLATE, FERRITIN, TIBC, IRON, RETICCTPCT in the last 72 hours. Urine analysis:    Component Value Date/Time   COLORURINE YELLOW 03/03/2021 0947   APPEARANCEUR CLEAR 03/03/2021 0947   APPEARANCEUR Turbid (A) 07/06/2017 0827   LABSPEC 1.013 03/03/2021 0947   PHURINE 6.0 03/03/2021 0947   GLUCOSEU NEGATIVE 03/03/2021 7209  HGBUR NEGATIVE 03/03/2021 0947   BILIRUBINUR NEGATIVE 03/03/2021 0947   BILIRUBINUR Negative 07/06/2017 0827   KETONESUR NEGATIVE 03/03/2021 0947   PROTEINUR NEGATIVE 03/03/2021 0947   UROBILINOGEN 0.2 08/08/2014 1147   NITRITE NEGATIVE 03/03/2021 0947   LEUKOCYTESUR NEGATIVE 03/03/2021 0947    Radiological Exams on Admission: CT Head Wo Contrast  Result Date: 03/03/2021 CLINICAL DATA:  A 59 year old male presents with mental status changes of unknown cause. EXAM: CT HEAD WITHOUT CONTRAST TECHNIQUE: Contiguous axial images were obtained from the base of the skull through the vertex without intravenous contrast. COMPARISON:  MRI of the brain of November of 2021. FINDINGS: Brain: No evidence of acute infarction, hemorrhage, hydrocephalus, extra-axial collection or mass lesion/mass effect. Signs of atrophy and chronic microvascular ischemic change with periventricular white matter changes in this patient with reported history of multiple sclerosis. Grossly similar compared to previous MRI. Vascular: No hyperdense vessel or unexpected calcification. Skull: Normal. Negative for fracture or focal lesion. Sinuses/Orbits: Visualized paranasal sinuses and  orbits are unremarkable. Other: None IMPRESSION: No acute intracranial abnormality. Signs of atrophy and chronic microvascular ischemic change with periventricular white matter changes in this patient with reported history of multiple sclerosis. Grossly similar compared to previous MRI. Electronically Signed   By: Donzetta Kohut M.D.   On: 03/03/2021 08:13   DG Chest Port 1 View  Result Date: 03/03/2021 CLINICAL DATA:  Altered mental status EXAM: PORTABLE CHEST 1 VIEW COMPARISON:  None. FINDINGS: Heart size and mediastinal contours are within normal limits. No suspicious pulmonary opacities identified. No pleural effusion or pneumothorax visualized. No acute osseous abnormality appreciated. IMPRESSION: No acute intrathoracic process identified. Electronically Signed   By: Jannifer Hick M.D.   On: 03/03/2021 07:55    EKG: Independently reviewed. Sinus tachy.  Assessment/Plan Active Problems:   Seizure (HCC)  (please populate well all problems here in Problem List. (For example, if patient is on BP meds at home and you resume or decide to hold them, it is a problem that needs to be her. Same for CAD, COPD, HLD and so on)  New onset of seizure -Order EEG, outpatient MRI as per neurology. -UDS, alcohol negative, patient denied regularly drinking alcohol. -He does not drive at all. -Telemetry observation 24 hours, likely can be discharged tomorrow. -Other Ddx, syncope less likely given the clinical presentation, EKG no PR changes and QTC prolongation borderline. Echo last year was benign. MS flareup less likely, given his past episodes all involved some kinds of muscle spasm. COVID infection less likely causing CNS acute changes before other systemic vial symptoms.  Sinus tachycardia -Lactate level, check TSH.  Might be post ictal changes.  COVID infection -Respiratory symptoms, no hypoxia, no fever, no acute infiltrate on chest x-ray. -COVID labs sent, monitor off antibiotic treatment for  now.  MS -In remission.  Most recent neurology follow-up was 2 weeks ago, and was considered stable. On Tecfidera 240 mg twice daily and Baclofen 1 tab in the morning, 1 tab at noon, and 2 tabs at bedtime. -Left sided leg weakness is chronic.  HTN -Continue ARB and HCTZ.  BPH -Stable, continue Flomax  DVT prophylaxis: Lovenox Code Status: Full Code Family Communication: Son at bedside Disposition Plan: Expect less than 2 midnight hospital stay Consults called: Neurology Admission status: Tele obs   Emeline General MD [facility] Pager [phone]  03/03/2021, 12:57 PM

## 2021-03-04 DIAGNOSIS — G35 Multiple sclerosis: Secondary | ICD-10-CM

## 2021-03-04 DIAGNOSIS — R338 Other retention of urine: Secondary | ICD-10-CM | POA: Diagnosis not present

## 2021-03-04 DIAGNOSIS — R569 Unspecified convulsions: Secondary | ICD-10-CM | POA: Diagnosis not present

## 2021-03-04 LAB — CBC WITH DIFFERENTIAL/PLATELET
Abs Immature Granulocytes: 0.04 10*3/uL (ref 0.00–0.07)
Basophils Absolute: 0 10*3/uL (ref 0.0–0.1)
Basophils Relative: 0 %
Eosinophils Absolute: 0 10*3/uL (ref 0.0–0.5)
Eosinophils Relative: 0 %
HCT: 35.2 % — ABNORMAL LOW (ref 39.0–52.0)
Hemoglobin: 11.5 g/dL — ABNORMAL LOW (ref 13.0–17.0)
Immature Granulocytes: 1 %
Lymphocytes Relative: 21 %
Lymphs Abs: 1.8 10*3/uL (ref 0.7–4.0)
MCH: 28 pg (ref 26.0–34.0)
MCHC: 32.7 g/dL (ref 30.0–36.0)
MCV: 85.9 fL (ref 80.0–100.0)
Monocytes Absolute: 0.7 10*3/uL (ref 0.1–1.0)
Monocytes Relative: 8 %
Neutro Abs: 5.9 10*3/uL (ref 1.7–7.7)
Neutrophils Relative %: 70 %
Platelets: 205 10*3/uL (ref 150–400)
RBC: 4.1 MIL/uL — ABNORMAL LOW (ref 4.22–5.81)
RDW: 13.2 % (ref 11.5–15.5)
WBC: 8.5 10*3/uL (ref 4.0–10.5)
nRBC: 0 % (ref 0.0–0.2)

## 2021-03-04 LAB — COMPREHENSIVE METABOLIC PANEL
ALT: 18 U/L (ref 0–44)
AST: 19 U/L (ref 15–41)
Albumin: 3.1 g/dL — ABNORMAL LOW (ref 3.5–5.0)
Alkaline Phosphatase: 53 U/L (ref 38–126)
Anion gap: 9 (ref 5–15)
BUN: 9 mg/dL (ref 8–23)
CO2: 25 mmol/L (ref 22–32)
Calcium: 9 mg/dL (ref 8.9–10.3)
Chloride: 107 mmol/L (ref 98–111)
Creatinine, Ser: 0.9 mg/dL (ref 0.61–1.24)
GFR, Estimated: >60 mL/min (ref >60)
Glucose, Bld: 100 mg/dL — ABNORMAL HIGH (ref 70–99)
Potassium: 3.4 mmol/L — ABNORMAL LOW (ref 3.5–5.1)
Sodium: 141 mmol/L (ref 135–145)
Total Bilirubin: 1.1 mg/dL (ref 0.3–1.2)
Total Protein: 5.8 g/dL — ABNORMAL LOW (ref 6.5–8.1)

## 2021-03-04 LAB — MAGNESIUM: Magnesium: 1.9 mg/dL (ref 1.7–2.4)

## 2021-03-04 LAB — D-DIMER, QUANTITATIVE: D-Dimer, Quant: 0.43 ug/mL-FEU (ref 0.00–0.50)

## 2021-03-04 LAB — FERRITIN: Ferritin: 112 ng/mL (ref 24–336)

## 2021-03-04 LAB — PHOSPHORUS: Phosphorus: 3.7 mg/dL (ref 2.5–4.6)

## 2021-03-04 LAB — C-REACTIVE PROTEIN: CRP: 0.8 mg/dL (ref <1.0)

## 2021-03-04 MED ORDER — POTASSIUM CHLORIDE CRYS ER 20 MEQ PO TBCR
40.0000 meq | EXTENDED_RELEASE_TABLET | Freq: Once | ORAL | Status: AC
  Administered 2021-03-04: 40 meq via ORAL
  Filled 2021-03-04: qty 2

## 2021-03-04 NOTE — ED Notes (Signed)
Bladder scan showed 645 volume, per orders an in and out was attempted but unsuccessful MD notified

## 2021-03-04 NOTE — ED Notes (Signed)
Patient leaving with Benedetto Goad provided by family at this time.

## 2021-03-04 NOTE — ED Notes (Signed)
Pt incontinent of stool. Pt cleaned up and bed changed.

## 2021-03-04 NOTE — Discharge Summary (Signed)
Physician Discharge Summary  AM[facility] STURTEVANT [id number] DOB: [birth year] DOA: 03/03/2021  PCP: Renford Dills, MD  Admit date: 03/03/2021 Discharge date: 03/04/2021  Admitted From: Home  Discharge disposition: Home   Recommendations for Outpatient Follow-Up:   Follow up with your primary care provider in one week.  Check CBC, BMP, magnesium in the next visit Follow-up with neurology Dr. Anne Hahn and urology Dr Annabell Howells as outpatient.  Discharge Diagnosis:   Active Problems:   New onset seizure [facility])   Discharge Condition: Improved.  Diet recommendation: Low sodium, heart healthy.    Wound care: None.  Code status: Full.   History of Present Illness:   Randy Shaw is a 60 y.o. male with past medical history of multiple sclerosis in remission with chronic left leg weakness and ambulatory dysfunction, hypertension, BPH presented to hospital with loss of consciousness in suspicion for seizure with tongue bite.  EMS arrived and found patient was awake but very confused. Vital sign and finger stick normal.  In the ED patient was noted to be confused tachycardic but afebrile.  Neurology was consulted but recommended no AED.  Hospital Course:   Following conditions were addressed during hospitalization as listed below,  New onset of seizure No AED as per neurology.  Patient will follow-up with his neurologist as outpatient.  Patient was observed more than 24 hours and did not have more seizures.  He did without any changes.  2D echocardiogram last year was within normal limits.  BPH with acute urinary retention. On Flomax.  During hospitalization patient required a Foley catheter placement.  Of note patient does have history of urinary retention in the past and was on prolonged Foley catheter followed by self-catheterization.  He follows up with Dr. Annabell Howells urology.  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  Continue Myrbetriq  Sinus tachycardia Presentation.  Resolved.   Asymptomatic COVID infection Patient did not have any respiratory symptoms hypoxia fever infiltrate.  History of multiple sclerosis on treatment patient will be considered for molnupiravir on discharge.  Multiple sclerosis In remission as per the patient.  Had neurology follow-up 2 weeks back.  Continue  Tecfidera 240 mg twice daily and Baclofen 1 tab in the morning, 1 tab at noon, and 2 tabs at bedtime.Patient does have left leg weakness which is chronic.   Essential hypertension Continue medications from home.   Disposition.  At this time, patient is stable for disposition with outpatient neurology and urology follow-up.  Medical Consultants:   Verbal consult with neurology  Procedures:    Foley catheter placement Subjective:   Today, patient was seen and examined at bedside.  Denies any further seizures headache dizziness lightheadedness.  Needed Foley catheter for acute urinary retention.  Discharge Exam:   Vitals:   03/04/21 0600 03/04/21 0900  BP: 123/73 (!) 120/93  Pulse: 77 90  Resp: 15 16  Temp:    SpO2: 97% 98%   Vitals:   03/04/21 0400 03/04/21 0500 03/04/21 0600 03/04/21 0900  BP: 110/76 118/73 123/73 (!) 120/93  Pulse: 80 74 77 90  Resp: 13  15 16   Temp:      TempSrc:      SpO2: 95% 98% 97% 98%   General: Alert awake, not in obvious distress HENT: pupils equally reacting to light,  No scleral pallor or icterus noted. Oral mucosa is moist.  Chest:  Clear  breath sounds.  Diminished breath sounds bilaterally. No crackles or wheezes.  CVS: S1 &S2 heard. No murmur.  Regular rate and rhythm. Abdomen: Soft, nontender, nondistended.  Bowel sounds are heard.  Foley catheter in place. Extremities: No cyanosis, clubbing or edema.  Peripheral pulses are palpable. Psych: Alert, awake and oriented, normal mood CNS:  No cranial nerve  deficits.  Chronic left lower extremity weakness. Skin: Warm and dry.  No rashes noted.  The results of significant diagnostics from this hospitalization (including imaging, microbiology, ancillary and laboratory) are listed below for reference.     Diagnostic Studies:   CT Head Wo Contrast  Result Date: 03/03/2021 CLINICAL DATA:  A 60 year old male presents with mental status changes of unknown cause. EXAM: CT HEAD WITHOUT CONTRAST TECHNIQUE: Contiguous axial images were obtained from the base of the skull through the vertex without intravenous contrast. COMPARISON:  MRI of the brain of November of 2021. FINDINGS: Brain: No evidence of acute infarction, hemorrhage, hydrocephalus, extra-axial collection or mass lesion/mass effect. Signs of atrophy and chronic microvascular ischemic change with periventricular white matter changes in this patient with reported history of multiple sclerosis. Grossly similar compared to previous MRI. Vascular: No hyperdense vessel or unexpected calcification. Skull: Normal. Negative for fracture or focal lesion. Sinuses/Orbits: Visualized paranasal sinuses and orbits are unremarkable. Other: None IMPRESSION: No acute intracranial abnormality. Signs of atrophy and chronic microvascular ischemic change with periventricular white matter changes in this patient with reported history of multiple sclerosis. Grossly similar compared to previous MRI. Electronically Signed   By: Zetta Bills M.D.   On: 03/03/2021 08:13   DG Chest Port 1 View  Result Date: 03/03/2021 CLINICAL DATA:  Altered mental status EXAM: PORTABLE CHEST 1 VIEW COMPARISON:  None. FINDINGS: Heart size and mediastinal contours are within normal limits. No suspicious pulmonary opacities identified. No pleural effusion or pneumothorax visualized. No acute osseous abnormality appreciated. IMPRESSION: No acute intrathoracic process identified. Electronically Signed   By: Ofilia Neas M.D.   On: 03/03/2021  07:55   EEG adult  Result Date: 03/03/2021 Lora Havens, MD     03/03/2021  9:38 PM Patient Name: Randy Shaw MRN: [id number] Epilepsy Attending: Lora Havens Referring Physician/Provider: Dr Wynetta Fines Date: 03/03/2021 Duration: 22.45 mis Patient history: 60yo M with new onset seizure. EEG to evaluate for seizure Level of alertness: Awake AEDs during EEG study: None Technical aspects: This EEG study was done with scalp electrodes positioned according to the 10-20 International system of electrode placement. Electrical activity was acquired at a sampling rate of 500Hz  and reviewed with a high frequency filter of 70Hz  and a low frequency filter of 1Hz . EEG data were recorded continuously and digitally stored. Description: No posterior dominant rhythm was seen. There is an excessive amount of 15 to 18 Hz beta activity distributed symmetrically and diffusely. Physiologic photic driving was not seen during photic stimulation.  Hyperventilation was not performed.   ABNORMALITY - Excessive beta, generalized IMPRESSION: This study is within normal limits. No seizures or epileptiform discharges were seen throughout the recording. The excessive beta activity seen in the background is most likely due to the effect of medications like benzodiazepine and is a benign EEG pattern. Lora Havens     Labs:   Basic Metabolic Panel: Recent Labs  Lab 03/03/21 0815 03/04/21 0540  NA 141 141  K 3.5 3.4*  CL 104 107  CO2 28 25  GLUCOSE 101* 100*  BUN 10 9  CREATININE 0.99 0.90  CALCIUM 9.3 9.0  MG  --  1.9  PHOS  --  3.7   GFR CrCl cannot be calculated (Unknown ideal weight.). Liver Function Tests: Recent Labs  Lab 03/03/21 0815 03/04/21 0540  AST 24 19  ALT 24 18  ALKPHOS 54 53  BILITOT 0.7 1.1  PROT 6.5 5.8*  ALBUMIN 3.7 3.1*   No results for input(s): LIPASE, AMYLASE in the last 168 hours. No results for input(s): AMMONIA in the last 168 hours. Coagulation profile No results for  input(s): INR, PROTIME in the last 168 hours.  CBC: Recent Labs  Lab 03/03/21 0815 03/04/21 0540  WBC 6.3 8.5  NEUTROABS 3.0 5.9  HGB 12.4* 11.5*  HCT 38.3* 35.2*  MCV 86.1 85.9  PLT 224 205   Cardiac Enzymes: No results for input(s): CKTOTAL, CKMB, CKMBINDEX, TROPONINI in the last 168 hours. BNP: Invalid input(s): POCBNP CBG: Recent Labs  Lab 03/03/21 0727  GLUCAP 121*   D-Dimer Recent Labs    03/03/21 1256 03/04/21 0614  DDIMER 0.57* 0.43   Hgb A1c No results for input(s): HGBA1C in the last 72 hours. Lipid Profile No results for input(s): CHOL, HDL, LDLCALC, TRIG, CHOLHDL, LDLDIRECT in the last 72 hours. Thyroid function studies Recent Labs    03/03/21 1307  TSH 1.582   Anemia work up Recent Labs    03/03/21 1256 03/04/21 0540  FERRITIN 124 112   Microbiology Recent Results (from the past 240 hour(s))  Resp Panel by RT-PCR (Flu A&B, Covid) Nasopharyngeal Swab     Status: Abnormal   Collection Time: 03/03/21  7:29 AM   Specimen: Nasopharyngeal Swab; Nasopharyngeal(NP) swabs in vial transport medium  Result Value Ref Range Status   SARS Coronavirus 2 by RT PCR POSITIVE (A) NEGATIVE Final    Comment: RESULT CALLED TO, READ BACK BY AND VERIFIED WITH: RN JANE L 0930 [id number] FCP (NOTE) SARS-CoV-2 target nucleic acids are DETECTED.  The SARS-CoV-2 RNA is generally detectable in upper respiratory specimens during the acute phase of infection. Positive results are indicative of the presence of the identified virus, but do not rule out bacterial infection or co-infection with other pathogens not detected by the test. Clinical correlation with patient history and other diagnostic information is necessary to determine patient infection status. The expected result is Negative.  Fact Sheet for Patients: EntrepreneurPulse.com.au  Fact Sheet for Healthcare Providers: IncredibleEmployment.be  This test is not yet approved  or cleared by the [place] FDA and  has been authorized for detection and/or diagnosis of SARS-CoV-2 by FDA under an Emergency Use Authorization (EUA).  This EUA will remain in effect (meaning this test can be used) f or the duration of  the COVID-19 declaration under Section 564(b)(1) of the Act, 21 U.S.C. section 360bbb-3(b)(1), unless the authorization is terminated or revoked sooner.     Influenza A by PCR NEGATIVE NEGATIVE Final   Influenza B by PCR NEGATIVE NEGATIVE Final    Comment: (NOTE) The Xpert Xpress SARS-CoV-2/FLU/RSV plus assay is intended as an aid in the diagnosis of influenza from Nasopharyngeal swab specimens and should not be used as a sole basis for treatment. Nasal washings and aspirates are unacceptable for Xpert Xpress SARS-CoV-2/FLU/RSV testing.  Fact Sheet for Patients: EntrepreneurPulse.com.au  Fact Sheet for Healthcare Providers: IncredibleEmployment.be  This test is not yet approved or cleared by the [place] FDA and has been authorized for detection and/or diagnosis of SARS-CoV-2 by FDA under an Emergency Use Authorization (EUA). This EUA will remain in effect (  meaning this test can be used) for the duration of the COVID-19 declaration under Section 564(b)(1) of the Act, 21 U.S.C. section 360bbb-3(b)(1), unless the authorization is terminated or revoked.  Performed at [facility], [place] [address] [place], [place] [address]      Discharge Instructions:   Discharge Instructions     Call MD for:  severe uncontrolled pain   Complete by: As directed    Call MD for:  temperature >100.4   Complete by: As directed    Diet general   Complete by: As directed    Discharge instructions   Complete by: As directed    Follow-up with your primary care physician in 1 week.  Follow-up with your neurologist Dr. Jannifer Franklin in 1 week to discuss about multiple sclerosis/seizure patient.  Continue Foley catheter  on discharge and follow-up with Dr. Rayann Heman urologist as outpatient for Foley catheter management..  Seek medical attention for worsening symptoms.   Increase activity slowly   Complete by: As directed       Allergies as of 03/04/2021       Reactions   Peanut-containing Drug Products Shortness Of Breath   Zanaflex [tizanidine Hcl] Shortness Of Breath        Medication List     TAKE these medications    Armodafinil 150 MG tablet Take 1 tablet (150 mg total) by mouth daily.   baclofen 20 MG tablet Commonly known as: LIORESAL TAKE 1 TABLET BY MOUTH IN THE MORNING, 1 TABLET AT NOON, AND 2 TABLETS AT NIGHT What changed:  how much to take how to take this when to take this additional instructions   losartan-hydrochlorothiazide 50-12.5 MG tablet Commonly known as: HYZAAR Take 1 tablet by mouth daily.   molnupiravir EUA 200 mg Caps capsule Commonly known as: LAGEVRIO Take 4 capsules (800 mg total) by mouth 2 (two) times daily for 5 days.   Myrbetriq 50 MG Tb24 tablet Generic drug: mirabegron ER Take 50 mg by mouth daily.   tamsulosin 0.4 MG Caps capsule Commonly known as: FLOMAX Take 1 capsule (0.4 mg total) by mouth daily.   Tecfidera 240 MG Cpdr Generic drug: Dimethyl Fumarate TAKE 1 CAPSULE BY MOUTH TWICE A DAY What changed:  how much to take when to take this   tiZANidine 2 MG tablet Commonly known as: ZANAFLEX Take 1 tablet (2 mg total) by mouth every 8 (eight) hours as needed for muscle spasms.        Follow-up Information     Kathrynn Ducking, MD .   Specialty: Neurology Why: call today to schedule a follow up appointment.  Tell them you were in the ER and had a seizure and need follow up Contact information: [address] [place] [address] [phone]         Irine Seal, MD. Schedule an appointment as soon as possible for a visit in 1 week(s).   Specialty: Urology Why: for foley catheter managment Contact information: [place] [address] [phone]         Seward Carol, MD Follow up.   Specialty: Internal Medicine Contact information: [address] [place] [address] [phone]         Buford Dresser, MD .   Specialty: Cardiology Contact information: [address] [place] [address] [phone]                  Time coordinating discharge: 39 minutes  Signed:  Renuka Farfan  [facility] 03/04/2021, 10:43 AM

## 2021-03-04 NOTE — ED Notes (Signed)
Patient given discharge instructions and education at this time. Patient awaiting transportation at this time. Patient unable to wait in the lobby due to mobility issues.

## 2021-03-04 NOTE — ED Notes (Signed)
Per MD pt needs  a 14 french foley

## 2021-03-04 NOTE — ED Notes (Signed)
Pt took his personal dose of Tecfidera (dimethyl fumarate).

## 2021-03-04 NOTE — Progress Notes (Signed)
[facility] night cross cover note:  I was contacted by RN regarding this patient who is admitted earlier on 03/03/2021 for new onset seizures, with RN conveying that the patient continues to express inability to urinate.  He was noted earlier to have a postvoid residual scan of greater than 800 cc prompting straight cath x1.  Since that time, the patient has been unable to void in spite of progressive urinary urgency.  I placed order for every 6 hours postvoid bladder scans with as needed straight cath for PVR greater than 400 cc, with first scan revealing 645 cc.  Staff subsequently attempted I&O catheter, but unable to successfully place as they continued to meet resistance in the process of trying to advance the catheter.  Ultimately, RN able to successfully place 14 French Foley catheter, which has subsequently been draining well.   Per chart review, it is noted that the patient has a history of BPH for which he is on Flomax at home, with medical history also notable for MS in remission.  Initial consideration given to urinary retention as a consequence of neurogenic bladder in the setting of MS, however, given interval difficulty with catheter placement, source appears to be more consistent with bladder outlet obstruction.  Unclear if there is any additional contribution from extrinsic relative obstruction such as from constipation.    Newton Pigg, DO Hospitalist

## 2021-03-08 ENCOUNTER — Telehealth: Payer: Self-pay | Admitting: *Deleted

## 2021-03-08 NOTE — Telephone Encounter (Signed)
Pt called regarding which pharmacy Rx was e-scribed to.  RNCM reviewed chart to access After Visit Summary and found that Rx was printed. Pt states he has misplaced printed Rx and asked RNCM to call in.  RNCM called in as written.  Advised pt to pick up in 1 hour.

## 2021-03-09 ENCOUNTER — Encounter: Payer: Self-pay | Admitting: Adult Health

## 2021-03-09 NOTE — Telephone Encounter (Signed)
This was cancelled as other form was found and had been completed by Dr. Anne Hahn.

## 2021-03-10 ENCOUNTER — Ambulatory Visit (INDEPENDENT_AMBULATORY_CARE_PROVIDER_SITE_OTHER): Payer: 59 | Admitting: Neurology

## 2021-03-10 ENCOUNTER — Encounter: Payer: Self-pay | Admitting: Neurology

## 2021-03-10 VITALS — BP 128/84 | HR 88

## 2021-03-10 DIAGNOSIS — G35 Multiple sclerosis: Secondary | ICD-10-CM | POA: Diagnosis not present

## 2021-03-10 DIAGNOSIS — G40909 Epilepsy, unspecified, not intractable, without status epilepticus: Secondary | ICD-10-CM

## 2021-03-10 DIAGNOSIS — R339 Retention of urine, unspecified: Secondary | ICD-10-CM | POA: Insufficient documentation

## 2021-03-10 DIAGNOSIS — R32 Unspecified urinary incontinence: Secondary | ICD-10-CM | POA: Insufficient documentation

## 2021-03-10 DIAGNOSIS — R5383 Other fatigue: Secondary | ICD-10-CM

## 2021-03-10 MED ORDER — LEVETIRACETAM 500 MG PO TABS: 500.0000 mg | ORAL_TABLET | Freq: Two times a day (BID) | ORAL | 11 refills | Status: DC

## 2021-03-10 NOTE — Progress Notes (Signed)
Chief Complaint  Patient presents with   New Patient (Initial Visit)    Rm 14, alone, new onset seizure, states he is feeling well today       ASSESSMENT AND PLAN  Randy Shaw is a 60 y.o. male   Active Secondary multiple sclerosis  With significant brain, cervical, thoracic spine involvement  Despite stable MRI findings, he continues to have worsening symptoms, including newly developed left INO, worsening left arm and leg weakness,   Patient has been on Tecfidera since 2015,  With his continued worsening neurological symptoms, likely active secondary multiple sclerosis  Had extensive discussion with patient, we decided to proceed with ocrelizumab infusion  Laboratory evaluation     New onset seizure on March 03, 2021  MRI of brain with and without contrast to rule out new structural abnormality  EEG  Keep Keppra 500 mg twice daily   DIAGNOSTIC DATA (LABS, IMAGING, TESTING) - I reviewed patient records, labs, notes, testing and imaging myself where available. I personally reviewed MRI of the brain with without contrast May 2021 - Multiple supratentorial and infratentorial chronic demyelinating plaques.  - No acute plaques. - Stable from 07/17/17.   MRI  cervical spine (with and without) demonstrating: - Multiple chronic demyelinating plaques from C2 down to C7.  - No acute plaques. No change from 07/17/17.   MRI thoracic w/wo Scattered foci of abnormal T2 signal within the thoracic spinal cord. No cord compression. Findings are consistent with the clinical diagnosis of multiple sclerosis. No lesions show contrast enhancement. Therefore, acute versus chronic abnormality is not clearly discernible.   Laboratory evaluation in November 2022: Normal magnesium, phosphate, ferritin level 112, C-reactive protein 0.8, CMP, potassium 3.4, creatinine 0.9, CBC hemoglobin of 11.5,, normal TSH 1.5, negative HIV, UDS  MEDICAL HISTORY:  Randy Shaw, is a 60 year old male,  previous patient of Dr. Anne Hahn for relapsing remitting multiple sclerosis, return for visit, also with new onset seizure March 03, 2021  I reviewed and summarized the referring note.PMHX Hypertension Relapsing remitting multiple sclerosis  Patient was diagnosed with remitting multiple sclerosis in July 1995, he presented with right arm numbness, later developed numbness in his left arm, progressive gait difficulty since 2000,.  He also has double vision. He was treated with Avonex in 1995 for one year, was switched to betaseron from 1996 till 2015, when he started to take Tecfidra.  Last flare up was around 2005.   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  The patient uses a wheelchair primarily since 2005. The patient is able to ambulate using a walker but primarily uses the scooter.   Personally reviewed MRI of the brain with and without contrast in May 2021, multiple supra and infratentorial chronic demyelinating plaque, no change compared to March 2019 MRI of cervical spine with without contrast, multiple chronic demyelinating plaque from C2-C7, no change MRI of thoracic spine with without contrast August 16, 2020, scattered foci of abnormal T2 signal within the thoracic spinal cord, no cord compression, consistent with clinical diagnosis of multiple sclerosis,   Despite no imaging changes, patient continues complain some worsening symptoms since 2021, has worsening double vision, could no longer move left eye past midline, in addition, he noticed increased weakness of left arm and leg, could no longer lifting up left foot,  Also noticed increased difficulty urinate, required Foley catheter now,  New onset seizure March 03, 2021, that was witnessed by his son, now is  treated with  Keppra 500 mg twice a day  He lives with his son, but still quite independent in his daily activity, can transfer himself,  PHYSICAL EXAM:   Vitals:   03/10/21 1128  BP:  128/84  Pulse: 88   PHYSICAL EXAMNIATION:  Gen: NAD, conversant, well nourised, well groomed                     Cardiovascular: Regular rate rhythm, no peripheral edema, warm, nontender. Eyes: Conjunctivae clear without exudates or hemorrhage Neck: Supple, no carotid bruits. Pulmonary: Clear to auscultation bilaterally   NEUROLOGICAL EXAM:  MENTAL STATUS: Speech:    Speech is normal; fluent and spontaneous with normal comprehension.  Cognition:     Orientation to time, place and person     Normal recent and remote memory     Normal Attention span and concentration     Normal Language, naming, repeating,spontaneous speech     Fund of knowledge   CRANIAL NERVES: CN II: Visual fields are full to confrontation. Pupils are round equal and briskly reactive to light. CN III, IV, VI: Ptosis, left INO, CN V: Facial sensation is intact to light touch CN VII: Face is symmetric with normal eye closure  CN VIII: Hearing is normal to causal conversation. CN IX, X: Phonation is normal. CN XI: Head turning and shoulder shrug are intact  MOTOR: Patient has mild bilateral hands weak grip 4/5, increased left lower extremity weakness, could not extend left leg, fairly free movement of right leg  REFLEXES: Reflexes are 2+ and symmetric at the biceps, triceps, knees, and ankles. Plantar responses are flexor.  SENSORY: Intact to light touch,   COORDINATION: There is no trunk or limb dysmetria noted.  GAIT/STANCE: Deferred  REVIEW OF SYSTEMS:  Full 14 system review of systems performed and notable only for as above All other review of systems were negative.   ALLERGIES: Allergies  Allergen Reactions   Peanut-Containing Drug Products Shortness Of Breath   Zanaflex [Tizanidine Hcl] Shortness Of Breath    HOME MEDICATIONS: Current Outpatient Medications  Medication Sig Dispense Refill   Armodafinil 150 MG tablet Take 1 tablet (150 mg total) by mouth daily. 90 tablet 1   baclofen  (LIORESAL) 20 MG tablet TAKE 1 TABLET BY MOUTH IN THE MORNING, 1 TABLET AT NOON, AND 2 TABLETS AT NIGHT (Patient taking differently: Take 20-40 mg by mouth See admin instructions. 20mg  in the morning, 20mg  at noon,and 40mg  at bedtime) 360 tablet 3   losartan-hydrochlorothiazide (HYZAAR) 50-12.5 MG tablet Take 1 tablet by mouth daily.     MYRBETRIQ 50 MG TB24 tablet Take 50 mg by mouth daily.     tamsulosin (FLOMAX) 0.4 MG CAPS capsule Take 1 capsule (0.4 mg total) by mouth daily. 30 capsule 0   TECFIDERA 240 MG CPDR TAKE 1 CAPSULE BY MOUTH TWICE A DAY (Patient taking differently: Take 240 mg by mouth in the morning and at bedtime.) 60 capsule 11   tiZANidine (ZANAFLEX) 2 MG tablet Take 1 tablet (2 mg total) by mouth every 8 (eight) hours as needed for muscle spasms. 30 tablet 0   No current facility-administered medications for this visit.    PAST MEDICAL HISTORY: Past Medical History:  Diagnosis Date   Abnormality of gait 04/22/2013   Asthma    BPH (benign prostatic hyperplasia)    Hypertension    Multiple sclerosis ([place])    Quadriplegia and quadriparesis ([place]) 03/08/2016    PAST SURGICAL HISTORY: Past Surgical History:  Procedure  Laterality Date   none      FAMILY HISTORY: Family History  Problem Relation Age of Onset   Cancer Mother        breast   Stroke Father    Cancer - Colon Father    Multiple sclerosis Neg Hx     SOCIAL HISTORY: Social History   Socioeconomic History   Marital status: Divorced    Spouse name: n/a   Number of children: 3   Years of education: 12   Highest education level: Not on file  Occupational History   Occupation: disabled/retired 1999-[place]    Comment: Education officer, museum  Tobacco Use   Smoking status: Never   Smokeless tobacco: Never  Substance and Sexual Activity   Alcohol use: Not Currently    Alcohol/week: 0.0 standard drinks    Comment: occasionally wine cooler   Drug use: No   Sexual activity: Never  Other Topics Concern    Not on file  Social History Narrative   Lives alone, house.     Right Handed   Drinks 2-3 cups caffeine   Social Determinants of Health   Financial Resource Strain: Not on file  Food Insecurity: Not on file  Transportation Needs: Not on file  Physical Activity: Not on file  Stress: Not on file  Social Connections: Not on file  Intimate Partner Violence: Not on file    Total time spent reviewing the chart, obtaining history, examined patient, ordering tests, documentation, consultations and family, care coordination was 47 minutes    Marcial Pacas, M.D. Ph.D.  [facility] [address] [place], [place] [address] Ph: [phone] Fax: [phone]  CC:  Seward Carol, MD [address] [place],  [place] [address]  Seward Carol, MD

## 2021-03-15 ENCOUNTER — Ambulatory Visit (INDEPENDENT_AMBULATORY_CARE_PROVIDER_SITE_OTHER): Payer: 59 | Admitting: Neurology

## 2021-03-15 DIAGNOSIS — G40909 Epilepsy, unspecified, not intractable, without status epilepticus: Secondary | ICD-10-CM | POA: Diagnosis not present

## 2021-03-15 DIAGNOSIS — G35 Multiple sclerosis: Secondary | ICD-10-CM

## 2021-03-16 ENCOUNTER — Telehealth: Payer: Self-pay | Admitting: Neurology

## 2021-03-16 ENCOUNTER — Telehealth: Payer: Self-pay

## 2021-03-16 LAB — HEMOGLOBIN A1C
Est. average glucose Bld gHb Est-mCnc: 117 mg/dL
Hgb A1c MFr Bld: 5.7 % — ABNORMAL HIGH (ref 4.8–5.6)

## 2021-03-16 LAB — QUANTIFERON-TB GOLD PLUS
QuantiFERON Mitogen Value: 5.8 IU/mL
QuantiFERON Nil Value: 0.05 IU/mL
QuantiFERON TB1 Ag Value: 0.03 IU/mL
QuantiFERON TB2 Ag Value: 0.04 IU/mL
QuantiFERON-TB Gold Plus: NEGATIVE

## 2021-03-16 LAB — CD19 AND CD20, FLOW CYTOMETRY

## 2021-03-16 LAB — VARICELLA ZOSTER ANTIBODY, IGG: Varicella zoster IgG: 741 index (ref >165)

## 2021-03-16 LAB — HEPATITIS B SURFACE ANTIGEN: Hepatitis B Surface Ag: NEGATIVE

## 2021-03-16 LAB — HEPATITIS B CORE ANTIBODY, TOTAL: Hep B Core Total Ab: NEGATIVE

## 2021-03-16 LAB — HEPATITIS B SURFACE ANTIBODY,QUALITATIVE: Hep B Surface Ab, Qual: REACTIVE

## 2021-03-16 LAB — IGG, IGA, IGM
IgA/Immunoglobulin A, Serum: 274 mg/dL (ref 61–437)
IgG (Immunoglobin G), Serum: 1030 mg/dL (ref 603–1613)
IgM (Immunoglobulin M), Srm: 184 mg/dL — ABNORMAL HIGH (ref 20–172)

## 2021-03-16 LAB — RPR: RPR Ser Ql: NONREACTIVE

## 2021-03-16 LAB — VITAMIN B12: Vitamin B-12: 527 pg/mL (ref 232–1245)

## 2021-03-16 LAB — VITAMIN D 25 HYDROXY (VIT D DEFICIENCY, FRACTURES): Vit D, 25-Hydroxy: 14.1 ng/mL — ABNORMAL LOW (ref 30.0–100.0)

## 2021-03-16 LAB — HEPATITIS C ANTIBODY: Hep C Virus Ab: 0.1 s/co ratio (ref 0.0–0.9)

## 2021-03-16 NOTE — Telephone Encounter (Signed)
[employer] auth: [id number] (exp. 03/16/21 to 04/14/21) order sent to GI, they will reach out to the patient to schedule.

## 2021-03-16 NOTE — Telephone Encounter (Signed)
Received Ocrevus Start Form from Dr. Terrace Arabia.  Faxed Ocrevus Start Form to [employer]. Received a receipt of confirmation.  Ocrevus order, start form, clinical info given to [facility] for processing.

## 2021-03-17 NOTE — Telephone Encounter (Signed)
Received this notice from the [facility]: "Hey there - just wanted to let you know that Randy Shaw 29-Sep-2060 can't be put through until he provides [place] with an insurance card in his name, not his daughters. [name] spoke to him and advised him of this plus the [facility] will no longer be servicing [place] so he'll have to get new insurance anyway.Marland KitchenMarland Kitchen"

## 2021-03-22 NOTE — Telephone Encounter (Signed)
I called the patient again letting him know [facility] needs a copy of his insurance card. He understand his Ocrevus approval is stalled until we receive this information. I ask him to contact his plan for a copy, if he does not have one at home. He said he would work on getting it today and will attach it to a [employer]. [facility] updated with this information.

## 2021-03-30 ENCOUNTER — Other Ambulatory Visit: Payer: Self-pay | Admitting: Family Medicine

## 2021-03-30 DIAGNOSIS — I10 Essential (primary) hypertension: Secondary | ICD-10-CM

## 2021-04-05 NOTE — Progress Notes (Signed)
Cardiology Office Note:    Date:  04/06/2021   ID:  AREL TIPPEN, DOB 1960-12-13, MRN [id number]  PCP:  Seward Carol, MD  Cardiologist:  Buford Dresser, MD  Referring MD: Seward Carol, MD   CC: Follow-up   History of Present Illness:    Randy Shaw is a 60 y.o. male with a hx of multiple sclerosis with quadriplegia, hypertension who is seen for follow-up. I initially met him 01/20/2020 as a new consult at the request of Seward Carol, MD for the evaluation and management of chest pain, bilateral LE edema, and abnormal ECG.  Today: Overall, he has been feeling different, unsure what to expect. However, he is still feeling happy. Since being home from his hospitalization 11/3 he states things have been normal.  Recently he denies any racing or hard heart beats.  He has had chest pains, but nothing that he considers unusual. At times the pain does make him nervous. The chest pains are very rare, maybe once a month always localized in the same area. He would describe his pain as sharp.  There continues to be edema in his bilateral feet and left foot numbness. The swelling is typically constant, and rarely he will have his feet propped up. In clinic today, he states his current level of swelling is normal for him.  He denies any palpitations, or shortness of breath. No lightheadedness, headaches, syncope, orthopnea, PND, or exertional symptoms.   Past Medical History:  Diagnosis Date   Abnormality of gait 04/22/2013   Asthma    BPH (benign prostatic hyperplasia)    Hypertension    Multiple sclerosis ([place])    Quadriplegia and quadriparesis ([place]) 03/08/2016    Past Surgical History:  Procedure Laterality Date   none      Current Medications: Current Outpatient Medications on File Prior to Visit  Medication Sig   Armodafinil 150 MG tablet Take 1 tablet (150 mg total) by mouth daily.   baclofen (LIORESAL) 20 MG tablet TAKE 1 TABLET BY MOUTH IN THE MORNING, 1  TABLET AT NOON, AND 2 TABLETS AT NIGHT (Patient taking differently: Take 20-40 mg by mouth See admin instructions. 40m in the morning, 226mat noon,and 4035mt bedtime)   levETIRAcetam (KEPPRA) 500 MG tablet Take 1 tablet (500 mg total) by mouth 2 (two) times daily.   losartan-hydrochlorothiazide (HYZAAR) 50-12.5 MG tablet Take 1 tablet by mouth daily.   MYRBETRIQ 50 MG TB24 tablet Take 50 mg by mouth daily.   tamsulosin (FLOMAX) 0.4 MG CAPS capsule Take 1 capsule (0.4 mg total) by mouth daily.   TECFIDERA 240 MG CPDR TAKE 1 CAPSULE BY MOUTH TWICE A DAY (Patient taking differently: Take 240 mg by mouth in the morning and at bedtime.)   tiZANidine (ZANAFLEX) 2 MG tablet Take 1 tablet (2 mg total) by mouth every 8 (eight) hours as needed for muscle spasms.   No current facility-administered medications on file prior to visit.     Allergies:   Peanut-containing drug products and Zanaflex [tizanidine hcl]   Social History   Tobacco Use   Smoking status: Never   Smokeless tobacco: Never  Substance Use Topics   Alcohol use: Not Currently    Alcohol/week: 0.0 standard drinks    Comment: occasionally wine cooler   Drug use: No    Family History: family history includes Cancer in his mother; Cancer - Colon in his father; Stroke in his father. There is no history of Multiple sclerosis.  ROS:  Please see the history of present illness. (+) Chest pain (+) Bilateral LE edema, feet (+) Numbness in left foot All other systems are reviewed and negative.   EKGs/Labs/Other Studies Reviewed:    The following studies were reviewed today:  Echo 02/02/2020: Sonographer Comments: Patient was scanned upright in a motorized  wheelchair in a slight laid back position due to being non-ambulatory  IMPRESSIONS    1. Left ventricular ejection fraction, by estimation, is 60 to 65%. The  left ventricle has normal function. The left ventricle has no regional  wall motion abnormalities. Left ventricular  diastolic parameters were  normal.   2. Right ventricular systolic function is normal. The right ventricular  size is normal. There is normal pulmonary artery systolic pressure.   3. The mitral valve is normal in structure. Trivial mitral valve  regurgitation. No evidence of mitral stenosis.   4. The aortic valve is tricuspid. Aortic valve regurgitation is mild. No  aortic stenosis is present.   5. Aortic dilatation noted. There is mild dilatation at the level of the  sinuses of Valsalva, measuring 43 mm. There is mild dilatation of the  ascending aorta, measuring 42 mm.   6. The inferior vena cava is normal in size with greater than 50%  respiratory variability, suggesting right atrial pressure of 3 mmHg.   EKG:  EKG is personally reviewed.   04/06/2021: EKG was not ordered. 03/03/2021 (ED): Sinus tachycardia at 125 bpm. Probable left atrial enlargement. Borderline left axis deviation. Abnormal R wave progression, early transition. Borderline T wave abnormalities. Borderline prolonged QT interval. No previous tracing. 01/20/2020: NSR at 79 bpm  Recent Labs: 03/03/2021: TSH 1.582 03/04/2021: ALT 18; BUN 9; Creatinine, Ser 0.90; Hemoglobin 11.5; Magnesium 1.9; Platelets 205; Potassium 3.4; Sodium 141   Recent Lipid Panel    Component Value Date/Time   CHOL 233 (H) 01/01/2019 1107   TRIG 98 01/01/2019 1107   HDL 67 01/01/2019 1107   CHOLHDL 3.5 01/01/2019 1107   CHOLHDL 3.4 07/30/2013 0952   VLDL 22 07/30/2013 0952   LDLCALC 149 (H) 01/01/2019 1107    Physical Exam:    VS:  BP 118/76   Ht 6' 3"  (1.905 m)   SpO2 97%   BMI 25.00 kg/m     Wt Readings from Last 3 Encounters:  08/10/20 200 lb (90.7 kg)  07/18/20 210 lb (95.3 kg)  11/17/19 210 lb (95.3 kg)    GEN: Well nourished, well developed in no acute distress HEENT: Normal, moist mucous membranes NECK: No JVD CARDIAC: regular rhythm, normal S1 and S2, no rubs or gallops. No murmurs. VASCULAR: Radial and DP pulses 2+  bilaterally. No carotid bruits RESPIRATORY:  Clear to auscultation without rales, wheezing or rhonchi  ABDOMEN: Soft, non-tender, non-distended MUSCULOSKELETAL:  Ambulates independently SKIN: Warm and dry, trivial bilateral LE edema NEUROLOGIC:  Alert and oriented x 3. In motorized wheelchair PSYCHIATRIC:  Normal affect    ASSESSMENT:    1. Aneurysm of ascending aorta without rupture   2. Pre-procedure lab exam   3. Lower extremity edema   4. Essential hypertension   5. Chest pain of uncertain etiology   6. Atypical chest pain     PLAN:    LE edema -trivial, normal echo -discussed compression, elevation, salt avoidance  Thoracic aortic aneurysm -will order follow up CT  Palpitations -resolved  Hypertension: at goal today -continue losartan-HCTZ  Atypical chest pain -sharp, brief, localized -discussed red flag warning signs that need immediate medical attention  Cardiac  risk counseling and prevention recommendations: -recommend heart healthy/Mediterranean diet, with whole grains, fruits, vegetable, fish, lean meats, nuts, and olive oil. Limit salt. -recommend avoidance of tobacco products. Avoid excess alcohol. -ASCVD risk score: The 10-year ASCVD risk score (Arnett DK, et al., 2019) is: 11.6%   Values used to calculate the score:     Age: 17 years     Sex: Male     Is Non-Hispanic African American: Yes     Diabetic: No     Tobacco smoker: No     Systolic Blood Pressure: 812 mmHg     Is BP treated: Yes     HDL Cholesterol: 67 mg/dL     Total Cholesterol: 233 mg/dL    Plan for follow up: If CT-A is unremarkable, I would be happy to see him again as needed if symptoms recur.  Buford Dresser, MD, PhD [place]  [facility]    Medication Adjustments/Labs and Tests Ordered: Current medicines are reviewed at length with the patient today.  Concerns regarding medicines are outlined above.   Orders Placed This Encounter  Procedures   CT ANGIO CHEST  AORTA W/CM & OR WO/CM   Basic metabolic panel    No orders of the defined types were placed in this encounter.  Patient Instructions  Medication Instructions:  Your Physician recommend you continue on your current medication as directed.    *If you need a refill on your cardiac medications before your next appointment, please call your pharmacy*   Lab Work: Your provider has recommended lab work 1 week prior to procedure (BMP). Please have this collected at [facility] [place]. The lab is open 8:00 am - 4:30 pm. Please avoid 12:00p - 1:00p for lunch hour. You do not need an appointment. Please go to [address] [place], [place] [address]. This is in the Primary Care office on the 3rd floor, let them know you are there for blood work and they will direct you to the lab.     Testing/Procedures:  CT Angiography (CTA) chest aorta in 1 year, is a special type of CT scan that uses a computer to produce multi-dimensional views of major blood vessels throughout the body. In CT angiography, a contrast material is injected through an IV to help visualize the blood vessels    Follow-Up: At [facility], you and your health needs are our priority.  As part of our continuing mission to provide you with exceptional heart care, we have created designated Provider Care Teams.  These Care Teams include your primary Cardiologist (physician) and Advanced Practice Providers (APPs -  Physician Assistants and Nurse Practitioners) who all work together to provide you with the care you need, when you need it.  We recommend signing up for the patient portal called "MyChart".  Sign up information is provided on this After Visit Summary.  MyChart is used to connect with patients for Virtual Visits (Telemedicine).  Patients are able to view lab/test results, encounter notes, upcoming appointments, etc.  Non-urgent messages can be sent to your provider as well.   To learn more about  what you can do with MyChart, go to [email or web address].    Your next appointment:   As needed  The format for your next appointment:   In Person  Provider:   Buford Dresser, MD      [facility],acting as a scribe for Buford Dresser, MD.,have documented all relevant documentation on the behalf of Buford Dresser, MD,as directed by  Buford Dresser, MD while in the presence of Buford Dresser, MD.  I, Buford Dresser, MD, have reviewed all documentation for this visit. The documentation on 04/06/21 for the exam, diagnosis, procedures, and orders are all accurate and complete.   Signed, Buford Dresser, MD PhD 04/06/2021   [facility]

## 2021-04-06 ENCOUNTER — Other Ambulatory Visit: Payer: Self-pay

## 2021-04-06 ENCOUNTER — Ambulatory Visit (INDEPENDENT_AMBULATORY_CARE_PROVIDER_SITE_OTHER): Payer: 59 | Admitting: Cardiology

## 2021-04-06 ENCOUNTER — Encounter (HOSPITAL_BASED_OUTPATIENT_CLINIC_OR_DEPARTMENT_OTHER): Payer: Self-pay | Admitting: Cardiology

## 2021-04-06 VITALS — BP 118/76 | Ht 75.0 in

## 2021-04-06 DIAGNOSIS — R600 Localized edema: Secondary | ICD-10-CM | POA: Diagnosis not present

## 2021-04-06 DIAGNOSIS — I7121 Aneurysm of the ascending aorta, without rupture: Secondary | ICD-10-CM

## 2021-04-06 DIAGNOSIS — R079 Chest pain, unspecified: Secondary | ICD-10-CM

## 2021-04-06 DIAGNOSIS — I10 Essential (primary) hypertension: Secondary | ICD-10-CM | POA: Diagnosis not present

## 2021-04-06 DIAGNOSIS — R0789 Other chest pain: Secondary | ICD-10-CM

## 2021-04-06 DIAGNOSIS — Z01812 Encounter for preprocedural laboratory examination: Secondary | ICD-10-CM

## 2021-04-06 NOTE — Patient Instructions (Addendum)
Medication Instructions:  Your Physician recommend you continue on your current medication as directed.    *If you need a refill on your cardiac medications before your next appointment, please call your pharmacy*   Lab Work: Your provider has recommended lab work 1 week prior to procedure (BMP). Please have this collected at [facility] [place]. The lab is open 8:00 am - 4:30 pm. Please avoid 12:00p - 1:00p for lunch hour. You do not need an appointment. Please go to [address] [place], [place] [address]. This is in the Primary Care office on the 3rd floor, let them know you are there for blood work and they will direct you to the lab.     Testing/Procedures:  CT Angiography (CTA) chest aorta in 1 year, is a special type of CT scan that uses a computer to produce multi-dimensional views of major blood vessels throughout the body. In CT angiography, a contrast material is injected through an IV to help visualize the blood vessels    Follow-Up: At [facility], you and your health needs are our priority.  As part of our continuing mission to provide you with exceptional heart care, we have created designated Provider Care Teams.  These Care Teams include your primary Cardiologist (physician) and Advanced Practice Providers (APPs -  Physician Assistants and Nurse Practitioners) who all work together to provide you with the care you need, when you need it.  We recommend signing up for the patient portal called "MyChart".  Sign up information is provided on this After Visit Summary.  MyChart is used to connect with patients for Virtual Visits (Telemedicine).  Patients are able to view lab/test results, encounter notes, upcoming appointments, etc.  Non-urgent messages can be sent to your provider as well.   To learn more about what you can do with MyChart, go to [email or web address].    Your next appointment:   As needed  The format for your next appointment:    In Person  Provider:   Jodelle Red, MD

## 2021-04-06 NOTE — Procedures (Signed)
   HISTORY: 60 year old male, with history of multiple sclerosis, had a seizure on March 03, 2021  TECHNIQUE:  This is a routine 16 channel EEG recording with one channel devoted to a limited EKG recording.  It was performed during wakefulness, drowsiness and asleep.  Photic stimulation were performed as activating procedures.  There are minimum muscle and movement artifact noted.  Upon maximum arousal, posterior dominant waking rhythm consistent of rhythmic alpha range activity. Activities are symmetric over the bilateral posterior derivations and attenuated with eye opening.  Hyperventilation was not performed due to positional limitation  Photic stimulation did not alter the tracing.  During EEG recording, patient developed drowsiness and no deeper stage of sleep was achieved.  During EEG recording, there was no epileptiform discharge noted.  EKG demonstrate sinus rhythm, with heart rate of 60 bpm  CONCLUSION: This is a  normal awake EEG.  There is no electrodiagnostic evidence of epileptiform discharge.  Levert Feinstein, M.D. Ph.D.  [facility] [address] [place], [place] [address] Phone: [phone] Fax:      [phone]

## 2021-04-08 ENCOUNTER — Ambulatory Visit
Admission: RE | Admit: 2021-04-08 | Discharge: 2021-04-08 | Disposition: A | Discharge status: Home / Self Care | Payer: 59 | Source: Ambulatory Visit | Attending: Neurology | Admitting: Neurology

## 2021-04-08 ENCOUNTER — Other Ambulatory Visit: Payer: Self-pay

## 2021-04-08 DIAGNOSIS — G35 Multiple sclerosis: Secondary | ICD-10-CM

## 2021-04-08 DIAGNOSIS — G40909 Epilepsy, unspecified, not intractable, without status epilepticus: Secondary | ICD-10-CM

## 2021-04-08 MED ORDER — GADOBENATE DIMEGLUMINE 529 MG/ML IV SOLN
19.0000 mL | Freq: Once | INTRAVENOUS | Status: AC | PRN
  Administered 2021-04-08: 19 mL via INTRAVENOUS

## 2021-05-03 NOTE — Telephone Encounter (Signed)
I called patient to find out his insurance status. No answer, left a message asking him to call us back.  If patient calls back, please remind him that [facility] needs a copy of his insurance card. Please inquire on this status.

## 2021-06-23 ENCOUNTER — Telehealth: Payer: Self-pay | Admitting: Neurology

## 2021-06-23 ENCOUNTER — Ambulatory Visit: Payer: 59 | Admitting: Neurology

## 2021-06-23 ENCOUNTER — Encounter: Payer: Self-pay | Admitting: Neurology

## 2021-06-23 VITALS — BP 132/83 | HR 76

## 2021-06-23 DIAGNOSIS — G40909 Epilepsy, unspecified, not intractable, without status epilepticus: Secondary | ICD-10-CM | POA: Diagnosis not present

## 2021-06-23 DIAGNOSIS — R269 Unspecified abnormalities of gait and mobility: Secondary | ICD-10-CM

## 2021-06-23 DIAGNOSIS — M792 Neuralgia and neuritis, unspecified: Secondary | ICD-10-CM | POA: Diagnosis not present

## 2021-06-23 DIAGNOSIS — G35 Multiple sclerosis: Secondary | ICD-10-CM

## 2021-06-23 DIAGNOSIS — W19XXXA Unspecified fall, initial encounter: Secondary | ICD-10-CM | POA: Insufficient documentation

## 2021-06-23 DIAGNOSIS — R3915 Urgency of urination: Secondary | ICD-10-CM | POA: Insufficient documentation

## 2021-06-23 DIAGNOSIS — W19XXXS Unspecified fall, sequela: Secondary | ICD-10-CM

## 2021-06-23 MED ORDER — LEVETIRACETAM 500 MG PO TABS: 500.0000 mg | ORAL_TABLET | Freq: Two times a day (BID) | ORAL | 4 refills | Status: DC

## 2021-06-23 MED ORDER — DULOXETINE HCL 60 MG PO CPEP: 60.0000 mg | ORAL_CAPSULE | Freq: Every day | ORAL | 11 refills | Status: DC

## 2021-06-23 NOTE — Telephone Encounter (Signed)
Sent message to Brittany with [facility] if she can take the patient.  °

## 2021-06-23 NOTE — Telephone Encounter (Signed)
[place] informed me they will see him this weekend.

## 2021-06-23 NOTE — Progress Notes (Addendum)
Chief Complaint  Patient presents with   Follow-up    Room 15 - alone. MS follow up. Using a motorized wheelchair. He is waiting for Ocrevus to be authorized.       ASSESSMENT AND PLAN  Randy Shaw is a 61 y.o. male   Active Secondary multiple sclerosis  With significant brain, cervical, thoracic spine involvement  He continues to have worsening symptoms, including newly developed left INO, worsening left arm and leg weakness, increased difficulty transferring himself in and out of wheelchair  Patient has been on Tecfidera since 2015,  With his continued worsening neurological symptoms, likely active secondary multiple sclerosis  He agreed to proceed with his ocrelizumab infusion, preauthorization pending Vitamin D deficiency  Vitamin D supplement Worsening lower extremity weakness, spasticity, falling episode,  Baclofen 20 mg 1/1/2 every day  Referral to home PT, OT, New onset lower extremity neuropathic pain,  Add on Cymbalta 60 mg daily    New onset seizure on March 03, 2021  EEG showed no significant abnormality,  Tolerating Keppra 500 mg twice daily  Urinary urgency,  Under urologist care, taking Flomax 0.4 twice a day   DIAGNOSTIC DATA (LABS, IMAGING, TESTING) - I reviewed patient records, labs, notes, testing and imaging myself where available. I personally reviewed MRI of the brain with without contrast May 2021 - Multiple supratentorial and infratentorial chronic demyelinating plaques.  - No acute plaques. - Stable from 07/17/17.   MRI  cervical spine (with and without) demonstrating: - Multiple chronic demyelinating plaques from C2 down to C7.  - No acute plaques. No change from 07/17/17.   MRI thoracic w/wo Scattered foci of abnormal T2 signal within the thoracic spinal cord. No cord compression. Findings are consistent with the clinical diagnosis of multiple sclerosis. No lesions show contrast enhancement. Therefore, acute versus chronic abnormality  is not clearly discernible.   Laboratory evaluation in November 2022: Normal magnesium, phosphate, ferritin level 112, C-reactive protein 0.8, CMP, potassium 3.4, creatinine 0.9, CBC hemoglobin of 11.5,, normal TSH 1.5, negative HIV, UDS  MEDICAL HISTORY:  Randy Shaw, is a 61 year old male, previous patient of Dr. Jannifer Franklin for relapsing remitting multiple sclerosis, return for visit, also with new onset seizure on March 03, 2021  I reviewed and summarized the referring note.PMHX Hypertension Relapsing remitting multiple sclerosis  Patient was diagnosed with remitting multiple sclerosis in July 1995, he presented with right arm numbness, later developed numbness in his left arm, progressive gait difficulty since 2000,.  He also has double vision. He was treated with Avonex in 1995 for one year, was switched to betaseron from 1996 till 2015, when he started to take Tecfidra.  Last flare up was around 2005.   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  The patient uses a wheelchair primarily since 2005. The patient is able to ambulate using a walker but primarily uses the scooter.   Personally reviewed MRI of the brain with and without contrast in May 2021, multiple supra and infratentorial chronic demyelinating plaque, no change compared to March 2019 MRI of cervical spine with without contrast, multiple chronic demyelinating plaque from C2-C7, no change MRI of thoracic spine with without contrast August 16, 2020, scattered foci of abnormal T2 signal within the thoracic spinal cord, no cord compression,   Despite no imaging changes, patient continues complain some worsening symptoms since 2021, has worsening double vision, could no longer move left eye past midline, in addition, he noticed increased weakness of  left arm and leg, could no longer lifting up left foot,  Also noticed increased difficulty urinate, required Foley catheter now,  New onset seizure  March 03, 2021, that was witnessed by his son, now is  treated with Keppra 500 mg twice a day  He lives with his son, but still quite independent in his daily activity, can transfer himself,  UPDATE Jun 23 2021: We personally reviewed MRI of the brain with without contrast in December 2023, no contrast-enhancement, supratentorium stable MS lesions  Over past few months, He developed new onset bilateral feet paresthesia, Plantar surface needle prick pain, increased lower extremity spasticity, gait abnormality, increased difficulty transferring himself, yesterday, getting up from the toilet, fell,  He also has urinary urgency, taking Flomax 0.4 mg twice a day, is under the care of urologist.  Reviewed extensive laboratory evaluations, which showed normal negative B12, RPR, hepatitis panel, varicella-zoster antibody, CD19, 20, IgG level,  Vitamin D deficiency level was 15.6, mild elevated A1c 5.7  PHYSICAL EXAM:   Vitals:   06/23/21 1255  BP: 132/83  Pulse: 76   PHYSICAL EXAMNIATION:  Gen: NAD, conversant, well nourised, well groomed                     Cardiovascular: Regular rate rhythm, no peripheral edema, warm, nontender. Eyes: Conjunctivae clear without exudates or hemorrhage Neck: Supple, no carotid bruits. Pulmonary: Clear to auscultation bilaterally   NEUROLOGICAL EXAM:  MENTAL STATUS: Speech/cognition awake, alert, oriented to history taking and care conversation:   CRANIAL NERVES: CN II: Visual fields are full to confrontation. Pupils are round equal and briskly reactive to light. CN III, IV, VI: Ptosis, left INO, right lateral gaze nystagmus CN V: Facial sensation is intact to light touch CN VII: Face is symmetric with normal eye closure  CN VIII: Hearing is normal to causal conversation. CN IX, X: Phonation is normal. CN XI: Head turning and shoulder shrug are intact  MOTOR: Fixation of left upper extremity on rapid rotating movement, left more than right hand  grip weakness, moderate spasticity of bilateral lower extremity, left worse than right, barely antigravity movement of left lower extremity, profound left ankle dorsiflexion weakness,  REFLEXES: Hyperreflexia, left worse than right  SENSORY: Intact to light touch,   COORDINATION: There is no trunk or limb dysmetria noted.  GAIT/STANCE: Deferred  REVIEW OF SYSTEMS:  Full 14 system review of systems performed and notable only for as above All other review of systems were negative.   ALLERGIES: Allergies  Allergen Reactions   Peanut-Containing Drug Products Shortness Of Breath   Zanaflex [Tizanidine Hcl] Shortness Of Breath    HOME MEDICATIONS: Current Outpatient Medications  Medication Sig Dispense Refill   Armodafinil 150 MG tablet Take 1 tablet (150 mg total) by mouth daily. 90 tablet 1   baclofen (LIORESAL) 20 MG tablet TAKE 1 TABLET BY MOUTH IN THE MORNING, 1 TABLET AT NOON, AND 2 TABLETS AT NIGHT (Patient taking differently: Take 20-40 mg by mouth See admin instructions. 20mg  in the morning, 20mg  at noon,and 40mg  at bedtime) 360 tablet 3   levETIRAcetam (KEPPRA) 500 MG tablet Take 1 tablet (500 mg total) by mouth 2 (two) times daily. 60 tablet 11   losartan-hydrochlorothiazide (HYZAAR) 50-12.5 MG tablet Take 1 tablet by mouth daily.     MYRBETRIQ 50 MG TB24 tablet Take 50 mg by mouth daily.     tamsulosin (FLOMAX) 0.4 MG CAPS capsule Take 1 capsule (0.4 mg total) by mouth daily. [place]  capsule 0   tiZANidine (ZANAFLEX) 2 MG tablet Take 1 tablet (2 mg total) by mouth every 8 (eight) hours as needed for muscle spasms. 30 tablet 0   No current facility-administered medications for this visit.    PAST MEDICAL HISTORY: Past Medical History:  Diagnosis Date   Abnormality of gait 04/22/2013   Asthma    BPH (benign prostatic hyperplasia)    Hypertension    Multiple sclerosis ([place])    Quadriplegia and quadriparesis ([place]) 03/08/2016    PAST SURGICAL HISTORY: Past Surgical  History:  Procedure Laterality Date   none      FAMILY HISTORY: Family History  Problem Relation Age of Onset   Cancer Mother        breast   Stroke Father    Cancer - Colon Father    Multiple sclerosis Neg Hx     SOCIAL HISTORY: Social History   Socioeconomic History   Marital status: Divorced    Spouse name: n/a   Number of children: 3   Years of education: 12   Highest education level: Not on file  Occupational History   Occupation: disabled/retired 1999-[place]    Comment: Education officer, museum  Tobacco Use   Smoking status: Never   Smokeless tobacco: Never  Substance and Sexual Activity   Alcohol use: Not Currently    Alcohol/week: 0.0 standard drinks    Comment: occasionally wine cooler   Drug use: No   Sexual activity: Never  Other Topics Concern   Not on file  Social History Narrative   Lives alone, house.     Right Handed   Drinks 2-3 cups caffeine   Social Determinants of Health   Financial Resource Strain: Not on file  Food Insecurity: Not on file  Transportation Needs: Not on file  Physical Activity: Not on file  Stress: Not on file  Social Connections: Not on file  Intimate Partner Violence: Not on file    Total time spent reviewing the chart, obtaining history, examined patient, ordering tests, documentation, consultations and family, care coordination was 70 minutes    Randy Shaw, M.D. Ph.D.  [facility] [address], [place], [place] [address] Ph: [phone] Fax: [phone]  CC:  Seward Carol, MD [address] [place],  [place] [address]  Seward Carol, MD

## 2021-06-23 NOTE — Telephone Encounter (Signed)
[facility] has been provided a copy of patient's updated insurance card.

## 2021-07-06 ENCOUNTER — Telehealth: Payer: Self-pay | Admitting: Neurology

## 2021-07-06 NOTE — Telephone Encounter (Signed)
[facility]) requesting verbal order for PT. ?Frequency: 1x wk for 5 wks ?

## 2021-07-06 NOTE — Telephone Encounter (Signed)
I returned the call to [facility] ([place]). She will proceed with the needed PT orders outlined below. ?

## 2021-08-11 ENCOUNTER — Telehealth: Payer: Self-pay | Admitting: Neurology

## 2021-08-11 MED ORDER — BACLOFEN 20 MG PO TABS: ORAL_TABLET | ORAL | 3 refills | Status: DC

## 2021-08-11 NOTE — Telephone Encounter (Signed)
Refills sent to the pharmacy. Called and notified the patient.  ?

## 2021-08-11 NOTE — Telephone Encounter (Signed)
Pt claims pharmacy ([facility] [phone] ) could not contact office in order to refill prescription of baclofen (LIORESAL) 20 MG tablet.  ?Pt would like a call back.  ?

## 2021-08-16 ENCOUNTER — Telehealth: Payer: Self-pay | Admitting: Neurology

## 2021-08-16 NOTE — Telephone Encounter (Signed)
[facility]) Requesting verbal orders for PT. ?Frequency: ? 1x wk for 4 wks. ?

## 2021-08-16 NOTE — Telephone Encounter (Signed)
I spoke to jeri at [facility]. She will proceed with the needed orders as outlined below. ?

## 2021-08-22 ENCOUNTER — Ambulatory Visit: Payer: 59 | Admitting: Adult Health

## 2021-10-20 ENCOUNTER — Telehealth: Payer: Self-pay | Admitting: Neurology

## 2021-10-20 NOTE — Telephone Encounter (Signed)
[name] from [facility] called requesting VO for continuance of Home Health PT for 1 X 1 week 1 every other week for 8 weeks.

## 2021-10-20 NOTE — Telephone Encounter (Signed)
I returned the call to [place]. She will update the orders, as outlined below, to meet the patient's needs.

## 2021-11-21 ENCOUNTER — Telehealth: Payer: Self-pay | Admitting: *Deleted

## 2021-11-21 NOTE — Telephone Encounter (Signed)
Received clearance request for colonoscopy from [facility] (ph: [phone]).  Dr. Terrace Arabia cleared patient for the procedure, to be completed at the location of the physician's choice (endoscopy center vs hospital).   Form faxed back to their office at [phone].

## 2021-12-22 ENCOUNTER — Ambulatory Visit (INDEPENDENT_AMBULATORY_CARE_PROVIDER_SITE_OTHER): Payer: 59 | Admitting: Neurology

## 2021-12-22 ENCOUNTER — Encounter: Payer: Self-pay | Admitting: Neurology

## 2021-12-22 VITALS — BP 123/83 | HR 86 | Ht 75.0 in

## 2021-12-22 DIAGNOSIS — M792 Neuralgia and neuritis, unspecified: Secondary | ICD-10-CM | POA: Diagnosis not present

## 2021-12-22 DIAGNOSIS — G35 Multiple sclerosis: Secondary | ICD-10-CM | POA: Diagnosis not present

## 2021-12-22 DIAGNOSIS — G40909 Epilepsy, unspecified, not intractable, without status epilepticus: Secondary | ICD-10-CM | POA: Diagnosis not present

## 2021-12-22 NOTE — Progress Notes (Signed)
Chief Complaint  Patient presents with   Follow-up    Rm 13. Alone. Pt c/o vision changes (twitching).      ASSESSMENT AND PLAN  Randy Shaw is a 61 y.o. male   Active Secondary multiple sclerosis  With significant brain, cervical, thoracic spine involvement  He continues to have worsening symptoms, including newly developed left INO, worsening left arm and leg weakness, increased difficulty transferring himself in and out of wheelchair  Patient has been on Tecfidera since 2015,  With his continued worsening neurological symptoms, likely active secondary multiple sclerosis  Started ocrelizumab infusion in May 2023, tolerating it well,  Check IgG level Vitamin D deficiency  Vitamin D supplement Worsening lower extremity weakness, spasticity, falling episode,  Baclofen 20 mg 1/1/2 every day  Referral to home PT, OT, Lower extremity neuropathic pain,  Keep Cymbalta 60 mg daily    New onset seizure on March 03, 2021  EEG showed no significant abnormality,  Tolerating Keppra 500 mg twice daily    DIAGNOSTIC DATA (LABS, IMAGING, TESTING) - I reviewed patient records, labs, notes, testing and imaging myself where available. I personally reviewed MRI of the brain with without contrast May 2021 - Multiple supratentorial and infratentorial chronic demyelinating plaques.  - No acute plaques. - Stable from 07/17/17.   MRI  cervical spine (with and without) demonstrating: - Multiple chronic demyelinating plaques from C2 down to C7.  - No acute plaques. No change from 07/17/17.   MRI thoracic w/wo Scattered foci of abnormal T2 signal within the thoracic spinal cord. No cord compression. Findings are consistent with the clinical diagnosis of multiple sclerosis. No lesions show contrast enhancement. Therefore, acute versus chronic abnormality is not clearly discernible.   Laboratory evaluation in November 2022: Normal magnesium, phosphate, ferritin level 112, C-reactive  protein 0.8, CMP, potassium 3.4, creatinine 0.9, CBC hemoglobin of 11.5,, normal TSH 1.5, negative HIV, UDS  MEDICAL HISTORY:  Randy Shaw, is a 61 year old male, previous patient of Dr. Anne Hahn for relapsing remitting multiple sclerosis, return for visit, also with new onset seizure on March 03, 2021  I reviewed and summarized the referring note.PMHX Hypertension Relapsing remitting multiple sclerosis  Patient was diagnosed with remitting multiple sclerosis in July 1995, he presented with right arm numbness, later developed numbness in his left arm, progressive gait difficulty since 2000,.  He also has double vision. He was treated with Avonex in 1995 for one year, was switched to betaseron from 1996 till 2015, when he started to take Tecfidra.  Last flare up was around 2005.   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  The patient uses a wheelchair primarily since 2005. The patient is able to ambulate using a walker but primarily uses the scooter.   Personally reviewed MRI of the brain with and without contrast in May 2021, multiple supra and infratentorial chronic demyelinating plaque, no change compared to March 2019 MRI of cervical spine with without contrast, multiple chronic demyelinating plaque from C2-C7, no change MRI of thoracic spine with without contrast August 16, 2020, scattered foci of abnormal T2 signal within the thoracic spinal cord, no cord compression,   Despite no imaging changes, patient continues complain some worsening symptoms since 2021, has worsening double vision, could no longer move left eye past midline, in addition, he noticed increased weakness of left arm and leg, could no longer lifting up left foot,  Also noticed increased difficulty urinate, required Foley catheter now,  New onset  seizure March 03, 2021, that was witnessed by his son, now is  treated with Keppra 500 mg twice a day  He lives with his son, but still  quite independent in his daily activity, can transfer himself,  UPDATE Jun 23 2021: We personally reviewed MRI of the brain with without contrast in December 2023, no contrast-enhancement, supratentorium stable MS lesions  Over past few months, He developed new onset bilateral feet paresthesia, Plantar surface needle prick pain, increased lower extremity spasticity, gait abnormality, increased difficulty transferring himself, yesterday, getting up from the toilet, fell,  He also has urinary urgency, taking Flomax 0.4 mg twice a day, is under the care of urologist.  Reviewed extensive laboratory evaluations, which showed normal negative B12, RPR, hepatitis panel, varicella-zoster antibody, CD19, 20, IgG level,  Vitamin D deficiency level was 15.6, mild elevated A1c 5.7  Update December 22, 2021 He received tocolytics and IV infusion in May 2023, tolerating it well, no significant change in his daily function, still able to manage transfer himself in and out of wheelchair, cooking, came into office by transportation  Laboratory evaluation showed normal negative TB, RPR, B12, vitamin D level was 14, A1c 5.7, negative hepatitis B, C,  PHYSICAL EXAM:   Vitals:   12/22/21 1322  BP: 123/83  Pulse: 86  Height: 6\' 3"  (1.905 m)   PHYSICAL EXAMNIATION:  Gen: NAD, conversant, well nourised, well groomed                     Cardiovascular: Regular rate rhythm, no peripheral edema, warm, nontender. Eyes: Conjunctivae clear without exudates or hemorrhage Neck: Supple, no carotid bruits. Pulmonary: Clear to auscultation bilaterally   NEUROLOGICAL EXAM:  MENTAL STATUS: Speech/cognition awake, alert, oriented to history taking and care conversation:   CRANIAL NERVES: CN II: Visual fields are full to confrontation. Pupils are round equal and briskly reactive to light. CN III, IV, VI: Ptosis, left INO, right lateral gaze nystagmus CN V: Facial sensation is intact to light touch CN VII: Face is  symmetric with normal eye closure  CN VIII: Hearing is normal to causal conversation. CN IX, X: Phonation is normal. CN XI: Head turning and shoulder shrug are intact  MOTOR: Fixation of left upper extremity on rapid rotating movement, left more than right hand grip weakness, moderate spasticity of bilateral lower extremity, left worse than right, barely antigravity movement of left lower extremity, profound left ankle dorsiflexion weakness,  REFLEXES: Hyperreflexia, left worse than right  SENSORY: Intact to light touch,   COORDINATION: There is no trunk or limb dysmetria noted.  GAIT/STANCE: Deferred  REVIEW OF SYSTEMS:  Full 14 system review of systems performed and notable only for as above All other review of systems were negative.   ALLERGIES: Allergies  Allergen Reactions   Peanut-Containing Drug Products Shortness Of Breath   Zanaflex [Tizanidine Hcl] Shortness Of Breath    HOME MEDICATIONS: Current Outpatient Medications  Medication Sig Dispense Refill   baclofen (LIORESAL) 20 MG tablet TAKE 1 TABLET BY MOUTH IN THE MORNING, 1 TABLET AT NOON, AND 2 TABLETS AT NIGHT 360 tablet 3   DULoxetine (CYMBALTA) 60 MG capsule Take 1 capsule (60 mg total) by mouth daily. 30 capsule 11   levETIRAcetam (KEPPRA) 500 MG tablet Take 1 tablet (500 mg total) by mouth 2 (two) times daily. 180 tablet 4   losartan-hydrochlorothiazide (HYZAAR) 50-12.5 MG tablet Take 1 tablet by mouth daily.     No current facility-administered medications for this visit.  PAST MEDICAL HISTORY: Past Medical History:  Diagnosis Date   Abnormality of gait 04/22/2013   Asthma    BPH (benign prostatic hyperplasia)    Hypertension    Multiple sclerosis ([place])    Quadriplegia and quadriparesis ([place]) 03/08/2016    PAST SURGICAL HISTORY: Past Surgical History:  Procedure Laterality Date   none      FAMILY HISTORY: Family History  Problem Relation Age of Onset   Cancer Mother        breast    Stroke Father    Cancer - Colon Father    Multiple sclerosis Neg Hx     SOCIAL HISTORY: Social History   Socioeconomic History   Marital status: Divorced    Spouse name: n/a   Number of children: 3   Years of education: 12   Highest education level: Not on file  Occupational History   Occupation: disabled/retired 1999-[place]    Comment: Education officer, museum  Tobacco Use   Smoking status: Never   Smokeless tobacco: Never  Substance and Sexual Activity   Alcohol use: Not Currently    Alcohol/week: 0.0 standard drinks of alcohol    Comment: occasionally wine cooler   Drug use: No   Sexual activity: Never  Other Topics Concern   Not on file  Social History Narrative   Lives alone, house.     Right Handed   Drinks 2-3 cups caffeine   Social Determinants of Health   Financial Resource Strain: Not on file  Food Insecurity: Not on file  Transportation Needs: Not on file  Physical Activity: Not on file  Stress: Not on file  Social Connections: Not on file  Intimate Partner Violence: Not on file     Marcial Pacas, M.D. Ph.D.  [facility] [address], [place], [place] [address] Ph: [phone] Fax: [phone]  CC:  Seward Carol, MD [address] [place],  [place] [address]  Seward Carol, MD

## 2021-12-23 LAB — COMPREHENSIVE METABOLIC PANEL
ALT: 17 IU/L (ref 0–44)
AST: 20 IU/L (ref 0–40)
Albumin/Globulin Ratio: 1.5 (ref 1.2–2.2)
Albumin: 3.9 g/dL (ref 3.9–4.9)
Alkaline Phosphatase: 78 IU/L (ref 44–121)
BUN/Creatinine Ratio: 17 (ref 10–24)
BUN: 15 mg/dL (ref 8–27)
Bilirubin Total: 0.4 mg/dL (ref 0.0–1.2)
CO2: 24 mmol/L (ref 20–29)
Calcium: 9.7 mg/dL (ref 8.6–10.2)
Chloride: 105 mmol/L (ref 96–106)
Creatinine, Ser: 0.89 mg/dL (ref 0.76–1.27)
Globulin, Total: 2.6 g/dL (ref 1.5–4.5)
Glucose: 104 mg/dL — ABNORMAL HIGH (ref 70–99)
Potassium: 3.8 mmol/L (ref 3.5–5.2)
Sodium: 143 mmol/L (ref 134–144)
Total Protein: 6.5 g/dL (ref 6.0–8.5)
eGFR: 97 mL/min/{1.73_m2} (ref >59)

## 2021-12-23 LAB — CBC WITH DIFFERENTIAL/PLATELET
Basophils Absolute: 0.1 10*3/uL (ref 0.0–0.2)
Basos: 1 %
EOS (ABSOLUTE): 0.2 10*3/uL (ref 0.0–0.4)
Eos: 5 %
Hematocrit: 40.4 % (ref 37.5–51.0)
Hemoglobin: 13.6 g/dL (ref 13.0–17.7)
Immature Grans (Abs): 0 10*3/uL (ref 0.0–0.1)
Immature Granulocytes: 0 %
Lymphocytes Absolute: 1.3 10*3/uL (ref 0.7–3.1)
Lymphs: 25 %
MCH: 27.7 pg (ref 26.6–33.0)
MCHC: 33.7 g/dL (ref 31.5–35.7)
MCV: 82 fL (ref 79–97)
Monocytes Absolute: 0.5 10*3/uL (ref 0.1–0.9)
Monocytes: 11 %
Neutrophils Absolute: 2.9 10*3/uL (ref 1.4–7.0)
Neutrophils: 58 %
Platelets: 209 10*3/uL (ref 150–450)
RBC: 4.91 x10E6/uL (ref 4.14–5.80)
RDW: 11.9 % (ref 11.6–15.4)
WBC: 5 10*3/uL (ref 3.4–10.8)

## 2021-12-23 LAB — IGG, IGA, IGM
IgA/Immunoglobulin A, Serum: 266 mg/dL (ref 61–437)
IgG (Immunoglobin G), Serum: 1017 mg/dL (ref 603–1613)
IgM (Immunoglobulin M), Srm: 131 mg/dL (ref 20–172)

## 2021-12-23 LAB — TSH: TSH: 2.28 u[IU]/mL (ref 0.450–4.500)

## 2022-01-09 ENCOUNTER — Telehealth: Payer: Self-pay

## 2022-01-09 NOTE — Telephone Encounter (Signed)
Received medical clearance for pt's upcoming Colonoscopy . I have placed on Dr. Zannie Cove desk for review and signature if appropriate.

## 2022-01-11 ENCOUNTER — Telehealth: Payer: Self-pay | Admitting: Neurology

## 2022-01-11 NOTE — Telephone Encounter (Signed)
Dr. Terrace Arabia has completed the form and I have faxed, confirmation received. Confirmation received.

## 2022-01-11 NOTE — Telephone Encounter (Signed)
Pt called wanting to give his new insurance information so that his chart can be updated. Pt is needing [employer] taken off and Rosann Auerbach put in its place  [place] ID# [id number] BIN# [id number] RX# [id number] Provider# [phone]  Account# [id number]

## 2022-01-11 NOTE — Telephone Encounter (Signed)
Noted  

## 2022-01-12 ENCOUNTER — Other Ambulatory Visit: Payer: Self-pay | Admitting: Gastroenterology

## 2022-03-01 ENCOUNTER — Telehealth (HOSPITAL_BASED_OUTPATIENT_CLINIC_OR_DEPARTMENT_OTHER): Payer: Self-pay | Admitting: Cardiology

## 2022-03-01 NOTE — Telephone Encounter (Signed)
Left message for patient to call and discuss scheduling the CTA chest/aorta ordered by Dr. Christopher 

## 2022-03-06 ENCOUNTER — Encounter (HOSPITAL_COMMUNITY): Payer: Self-pay | Admitting: Gastroenterology

## 2022-03-07 NOTE — Progress Notes (Signed)
Attempted to obtain medical history via telephone, unable to reach at this time. HIPAA compliant voicemail message left requesting return call to pre surgical testing department. 

## 2022-03-08 ENCOUNTER — Ambulatory Visit (HOSPITAL_COMMUNITY)
Admission: RE | Admit: 2022-03-08 | Discharge: 2022-03-08 | Disposition: A | Discharge status: Home / Self Care | Payer: Commercial Managed Care - HMO | Source: Ambulatory Visit | Attending: Cardiology | Admitting: Cardiology

## 2022-03-08 DIAGNOSIS — I7121 Aneurysm of the ascending aorta, without rupture: Secondary | ICD-10-CM | POA: Diagnosis present

## 2022-03-08 MED ORDER — IOHEXOL 350 MG/ML SOLN
100.0000 mL | Freq: Once | INTRAVENOUS | Status: AC | PRN
  Administered 2022-03-08: 100 mL via INTRAVENOUS

## 2022-03-09 ENCOUNTER — Encounter (HOSPITAL_COMMUNITY): Payer: Self-pay | Admitting: Gastroenterology

## 2022-03-13 ENCOUNTER — Other Ambulatory Visit: Payer: Self-pay | Admitting: Neurology

## 2022-03-14 ENCOUNTER — Ambulatory Visit (HOSPITAL_COMMUNITY)
Admission: RE | Admit: 2022-03-14 | Discharge: 2022-03-14 | Disposition: A | Discharge status: Home / Self Care | Payer: Commercial Managed Care - HMO | Attending: Gastroenterology | Admitting: Gastroenterology

## 2022-03-14 ENCOUNTER — Encounter (HOSPITAL_COMMUNITY): Payer: Self-pay | Admitting: Gastroenterology

## 2022-03-14 ENCOUNTER — Encounter (HOSPITAL_COMMUNITY)
Admission: RE | Disposition: A | Discharge status: Home / Self Care | Payer: Self-pay | Source: Home / Self Care | Attending: Gastroenterology

## 2022-03-14 ENCOUNTER — Other Ambulatory Visit: Payer: Self-pay

## 2022-03-14 ENCOUNTER — Ambulatory Visit (HOSPITAL_BASED_OUTPATIENT_CLINIC_OR_DEPARTMENT_OTHER): Payer: Commercial Managed Care - HMO | Admitting: Certified Registered Nurse Anesthetist

## 2022-03-14 ENCOUNTER — Ambulatory Visit (HOSPITAL_COMMUNITY): Payer: Commercial Managed Care - HMO | Admitting: Certified Registered Nurse Anesthetist

## 2022-03-14 DIAGNOSIS — G709 Myoneural disorder, unspecified: Secondary | ICD-10-CM | POA: Diagnosis not present

## 2022-03-14 DIAGNOSIS — G35 Multiple sclerosis: Secondary | ICD-10-CM | POA: Diagnosis not present

## 2022-03-14 DIAGNOSIS — Z79899 Other long term (current) drug therapy: Secondary | ICD-10-CM | POA: Diagnosis not present

## 2022-03-14 DIAGNOSIS — Z800 Family history of malignant neoplasm of digestive organs: Secondary | ICD-10-CM | POA: Diagnosis present

## 2022-03-14 DIAGNOSIS — G8250 Quadriplegia, unspecified: Secondary | ICD-10-CM | POA: Diagnosis not present

## 2022-03-14 DIAGNOSIS — Z1211 Encounter for screening for malignant neoplasm of colon: Secondary | ICD-10-CM | POA: Insufficient documentation

## 2022-03-14 DIAGNOSIS — Z993 Dependence on wheelchair: Secondary | ICD-10-CM | POA: Insufficient documentation

## 2022-03-14 DIAGNOSIS — I10 Essential (primary) hypertension: Secondary | ICD-10-CM | POA: Diagnosis not present

## 2022-03-14 DIAGNOSIS — K648 Other hemorrhoids: Secondary | ICD-10-CM | POA: Insufficient documentation

## 2022-03-14 DIAGNOSIS — R569 Unspecified convulsions: Secondary | ICD-10-CM | POA: Diagnosis not present

## 2022-03-14 DIAGNOSIS — K5730 Diverticulosis of large intestine without perforation or abscess without bleeding: Secondary | ICD-10-CM | POA: Diagnosis not present

## 2022-03-14 HISTORY — PX: COLONOSCOPY WITH PROPOFOL: SHX5780

## 2022-03-14 LAB — POCT I-STAT, CHEM 8
BUN: 18 mg/dL (ref 8–23)
Calcium, Ion: 1.1 mmol/L — ABNORMAL LOW (ref 1.15–1.40)
Chloride: 106 mmol/L (ref 98–111)
Creatinine, Ser: 1 mg/dL (ref 0.61–1.24)
Glucose, Bld: 123 mg/dL — ABNORMAL HIGH (ref 70–99)
HCT: 45 % (ref 39.0–52.0)
Hemoglobin: 15.3 g/dL (ref 13.0–17.0)
Potassium: 3.3 mmol/L — ABNORMAL LOW (ref 3.5–5.1)
Sodium: 140 mmol/L (ref 135–145)
TCO2: 24 mmol/L (ref 22–32)

## 2022-03-14 SURGERY — COLONOSCOPY WITH PROPOFOL
Anesthesia: Monitor Anesthesia Care

## 2022-03-14 MED ORDER — PROPOFOL 10 MG/ML IV BOLUS
INTRAVENOUS | Status: DC | PRN
  Administered 2022-03-14: 10 mg via INTRAVENOUS
  Administered 2022-03-14 (×2): 20 mg via INTRAVENOUS

## 2022-03-14 MED ORDER — LACTATED RINGERS IV SOLN: INTRAVENOUS | Status: DC

## 2022-03-14 MED ORDER — PROPOFOL 500 MG/50ML IV EMUL
INTRAVENOUS | Status: DC | PRN
  Administered 2022-03-14: 100 ug/kg/min via INTRAVENOUS

## 2022-03-14 MED ORDER — SODIUM CHLORIDE 0.9 % IV SOLN: INTRAVENOUS | Status: DC

## 2022-03-14 SURGICAL SUPPLY — 22 items

## 2022-03-14 NOTE — Discharge Instructions (Signed)

## 2022-03-14 NOTE — Transfer of Care (Signed)
Immediate Anesthesia Transfer of Care Note  Patient: JAXYN ROUT  Procedure(s) Performed: COLONOSCOPY WITH PROPOFOL  Patient Location: PACU and Endoscopy Unit  Anesthesia Type:MAC  Level of Consciousness: drowsy and patient cooperative  Airway & Oxygen Therapy: Patient Spontanous Breathing and Patient connected to nasal cannula oxygen  Post-op Assessment: Report given to RN and Post -op Vital signs reviewed and stable  Post vital signs: Reviewed and stable  Last Vitals:  Vitals Value Taken Time  BP 150/78 03/14/22 1056  Temp 36.6 C 03/14/22 1056  Pulse 93 03/14/22 1059  Resp 19 03/14/22 1059  SpO2 98 % 03/14/22 1059  Vitals shown include unvalidated device data.  Last Pain:  Vitals:   03/14/22 1056  TempSrc: Temporal  PainSc:          Complications: No notable events documented.

## 2022-03-14 NOTE — Op Note (Signed)
[facility] Patient Name: Randy Shaw Procedure Date: 03/14/2022 MRN: [id number] Attending MD: Kathi Der , MD, [id number] Date of Birth: [birth year] CSN: [id number] Age: 61 Admit Type: Outpatient Procedure:                Colonoscopy Indications:              Screening for colorectal malignant neoplasm Providers:                Kathi Der, MD, Adolph Pollack, RN, Alan Ripper Technician, Technician, Marja Kays,                            Technician Referring MD:              Medicines:                Sedation Administered by an Anesthesia Professional Complications:            No immediate complications. Estimated Blood Loss:     Estimated blood loss was minimal. Procedure:                Pre-Anesthesia Assessment:                           - Prior to the procedure, a History and Physical                            was performed, and patient medications and                            allergies were reviewed. The patient's tolerance of                            previous anesthesia was also reviewed. The risks                            and benefits of the procedure and the sedation                            options and risks were discussed with the patient.                            All questions were answered, and informed consent                            was obtained. Prior Anticoagulants: The patient has                            taken no anticoagulant or antiplatelet agents. ASA                            Grade Assessment: III - A patient with severe  systemic disease. After reviewing the risks and                            benefits, the patient was deemed in satisfactory                            condition to undergo the procedure.                           After obtaining informed consent, the colonoscope                            was passed under direct vision. Throughout the                             procedure, the patient's blood pressure, pulse, and                            oxygen saturations were monitored continuously. The                            PCF-HQ190L ([id number]) Olympus colonoscope was                            introduced through the anus and advanced to the the                            cecum, identified by appendiceal orifice and                            ileocecal valve. The colonoscopy was performed with                            moderate difficulty due to a redundant colon.                            Successful completion of the procedure was aided by                            applying abdominal pressure. The patient tolerated                            the procedure well. The quality of the bowel                            preparation was adequate to identify polyps greater                            than 5 mm in size and fair. The ileocecal valve,                            appendiceal orifice, and rectum were photographed. Scope In: 10:31:34 AM Scope Out: 10:48:04 AM Scope Withdrawal Time: 0 hours 10 minutes 17 seconds  Total Procedure Duration: 0 hours 16 minutes 30 seconds  Findings:      The perianal and digital rectal examinations were normal.      The recto-sigmoid colon was moderately redundant. Advancing the scope       required applying abdominal pressure.      Scattered diverticula were found in the entire colon.      Internal hemorrhoids were found during retroflexion. The hemorrhoids       were medium-sized. Impression:               - Preparation of the colon was fair.                           - Redundant colon.                           - Diverticulosis in the entire examined colon.                           - Internal hemorrhoids.                           - No specimens collected. Moderate Sedation:      Moderate (conscious) sedation was personally administered by an       anesthesia professional. The following  parameters were monitored: oxygen       saturation, heart rate, blood pressure, and response to care. Recommendation:           - Patient has a contact number available for                            emergencies. The signs and symptoms of potential                            delayed complications were discussed with the                            patient. Return to normal activities tomorrow.                            Written discharge instructions were provided to the                            patient.                           - Resume previous diet.                           - Continue present medications.                           - Repeat colonoscopy in 7 years for screening                            purposes.                           -  Return to my office PRN. Procedure Code(s):        --- Professional ---                           E9528, Colorectal cancer screening; colonoscopy on                            individual not meeting criteria for high risk Diagnosis Code(s):        --- Professional ---                           Z12.11, Encounter for screening for malignant                            neoplasm of colon                           K64.8, Other hemorrhoids                           Q43.8, Other specified congenital malformations of                            intestine CPT copyright 2022 [employer]. All rights reserved. The codes documented in this report are preliminary and upon coder review may  be revised to meet current compliance requirements. Kathi Der, MD Kathi Der, MD 03/14/2022 11:03:39 AM Number of Addenda: 0

## 2022-03-14 NOTE — H&P (Signed)
Primary Care Physician:  Renford Dills, MD Primary Gastroenterologist: [facility]  Reason for Visit -outpatient colonoscopy  HPI: Randy Shaw is a 61 y.o. male here for outpatient colonoscopy.  Personal history of MS diagnosed 14. Patient has been wheelchair bound since 2012. Negative Cologuard 03/18/2018 (Dr. Carley Hammed).        Previous colonoscopy report not available to review.  Not sure when it was done but it was not probably 10 years ago.        Labs 06/2021 - Normal CBC, CMP          He is complaining of some abdominal discomfort since started drinking prep yesterday.  Complaining of sensation of fullness and distention.  Denies nausea, vomiting, fever, chills, abdominal pain, dysphagia, reflux, heartburn. Bowel movements are regular, every other day. Denies diarrhea, constipation, melena, hematochezia, weight loss.        Patient denies family history of GI malignancy or disease. Denies MI/stroke history, is not on any blood thinners, is not diabetic.        Patient has history of new onset seizure November 2022, no cause determined, now following with neurology. This was his first and only seizure to date.  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    Past Surgical History:  Procedure Laterality Date   none      Prior to Admission medications   Medication Sig Start Date End Date Taking? Authorizing Provider  baclofen (LIORESAL) 20 MG tablet TAKE 1 TABLET BY MOUTH IN THE MORNING, 1 TABLET AT NOON, AND 2 TABLETS AT NIGHT Patient taking differently: Take 20 mg by mouth 4 (four) times daily. 08/11/21  Yes Levert Feinstein, MD  Cholecalciferol (VITAMIN D-3) 125 MCG (5000 UT) TABS Take 5,000 Units by mouth daily.   Yes [provider]  DULoxetine (CYMBALTA) 60 MG capsule Take 1 capsule (60 mg  total) by mouth daily. 06/23/21  Yes Levert Feinstein, MD  levETIRAcetam (KEPPRA) 500 MG tablet Take 1 tablet (500 mg total) by mouth 2 (two) times daily. 06/23/21  Yes Levert Feinstein, MD  losartan-hydrochlorothiazide (HYZAAR) 50-12.5 MG tablet Take 1 tablet by mouth daily. 01/12/21  Yes [provider]  tamsulosin (FLOMAX) 0.4 MG CAPS capsule Take 0.4 mg by mouth 2 (two) times daily.   Yes [provider]    Scheduled Meds: Continuous Infusions:  sodium chloride     lactated ringers 10 mL/hr at 03/14/22 0955   PRN Meds:.  Allergies as of 01/12/2022 - Review Complete 12/22/2021  Allergen Reaction Noted   Peanut-containing drug products Shortness Of Breath 04/22/2013   Zanaflex [tizanidine hcl] Shortness Of Breath 12/07/2011    Family History  Problem Relation Age of Onset   Cancer Mother        breast   Stroke Father    Cancer - Colon Father    Multiple sclerosis Neg Hx     Social History   Socioeconomic History   Marital status: Divorced    Spouse name: n/a   Number of children: 3   Years of education: 12   Highest education level: Not on file  Occupational History   Occupation: disabled/retired 1999-[place]    Comment: Event organiser  Tobacco Use   Smoking status: Never   Smokeless tobacco: Never  Substance and Sexual  Activity   Alcohol use: Not Currently    Alcohol/week: 0.0 standard drinks of alcohol    Comment: occasionally wine cooler   Drug use: No   Sexual activity: Never  Other Topics Concern   Not on file  Social History Narrative   Lives alone, house.     Right Handed   Drinks 2-3 cups caffeine   Social Determinants of Health   Financial Resource Strain: Not on file  Food Insecurity: Not on file  Transportation Needs: Not on file  Physical Activity: Not on file  Stress: Not on file  Social Connections: Not on file  Intimate Partner Violence: Not on file    Review of Systems: All negative except as stated above in HPI.  Physical  Exam: Vital signs: Vitals:   03/14/22 0931  BP: (!) 169/101  Resp: 16  Temp: 98.6 F (37 C)  SpO2: 98%     General:   Alert,  Well-developed, well-nourished, pleasant and cooperative in NAD Lungs:  Clear throughout to auscultation.   No wheezes, crackles, or rhonchi. No acute distress. Heart:  Regular rate and rhythm; no murmurs, clicks, rubs,  or gallops. Abdomen: Mildly distended but otherwise soft, bowel sounds present, no peritoneal signs Rectal:  Deferred  GI:  Lab Results: No results for input(s): "WBC", "HGB", "HCT", "PLT" in the last 72 hours. BMET No results for input(s): "NA", "K", "CL", "CO2", "GLUCOSE", "BUN", "CREATININE", "CALCIUM" in the last 72 hours. LFT No results for input(s): "PROT", "ALBUMIN", "AST", "ALT", "ALKPHOS", "BILITOT", "BILIDIR", "IBILI" in the last 72 hours. PT/INR No results for input(s): "LABPROT", "INR" in the last 72 hours.   Studies/Results: No results found.  Impression/Plan: -Colon cancer screening -Treat multiple sclerosis  Recommendations -------------------------- -Proceed with colonoscopy today.  Risks (bleeding, infection, bowel perforation that could require surgery, sedation-related changes in cardiopulmonary systems), benefits (identification and possible treatment of source of symptoms, exclusion of certain causes of symptoms), and alternatives (watchful waiting, radiographic imaging studies, empiric medical treatment)  were explained to patient/family in detail and patient wishes to proceed.     LOS: 0 days   Kathi Der  MD, FACP 03/14/2022, 9:57 AM  Contact #  [phone]

## 2022-03-14 NOTE — Anesthesia Preprocedure Evaluation (Addendum)
Anesthesia Evaluation  Patient identified by MRN, date of birth, ID band Patient awake    Reviewed: Allergy & Precautions, NPO status , Patient's Chart, lab work & pertinent test results  History of Anesthesia Complications Negative for: history of anesthetic complications  Airway Mallampati: I  TM Distance: >3 FB Neck ROM: Full    Dental  (+) Teeth Intact, Dental Advisory Given,    Pulmonary neg shortness of breath, neg sleep apnea, neg COPD, neg recent URI   breath sounds clear to auscultation       Cardiovascular hypertension, Pt. on medications (-) angina (-) Past MI and (-) CHF  Rhythm:Regular   1. Left ventricular ejection fraction, by estimation, is 60 to 65%. The  left ventricle has normal function. The left ventricle has no regional  wall motion abnormalities. Left ventricular diastolic parameters were  normal.   2. Right ventricular systolic function is normal. The right ventricular  size is normal. There is normal pulmonary artery systolic pressure.   3. The mitral valve is normal in structure. Trivial mitral valve  regurgitation. No evidence of mitral stenosis.   4. The aortic valve is tricuspid. Aortic valve regurgitation is mild. No  aortic stenosis is present.   5. Aortic dilatation noted. There is mild dilatation at the level of the  sinuses of Valsalva, measuring 43 mm. There is mild dilatation of the  ascending aorta, measuring 42 mm.   6. The inferior vena cava is normal in size with greater than 50%  respiratory variability, suggesting right atrial pressure of 3 mmHg.     Neuro/Psych Seizures -, Well Controlled,   Neuromuscular disease    GI/Hepatic negative GI ROS, Neg liver ROS,,,  Endo/Other  negative endocrine ROS    Renal/GU negative Renal ROS     Musculoskeletal negative musculoskeletal ROS (+)    Abdominal   Peds  Hematology negative hematology ROS (+) Lab Results      Component                 Value               Date                      WBC                      5.0                 12/22/2021                HGB                      13.6                12/22/2021                HCT                      40.4                12/22/2021                MCV                      82                  12/22/2021  PLT                      209                 12/22/2021              Anesthesia Other Findings   Reproductive/Obstetrics                             Anesthesia Physical Anesthesia Plan  ASA: 2  Anesthesia Plan: MAC   Post-op Pain Management: Minimal or no pain anticipated   Induction: Intravenous  PONV Risk Score and Plan: 1 and Propofol infusion and Treatment may vary due to age or medical condition  Airway Management Planned: Nasal Cannula and Natural Airway  Additional Equipment: None  Intra-op Plan:   Post-operative Plan:   Informed Consent: I have reviewed the patients History and Physical, chart, labs and discussed the procedure including the risks, benefits and alternatives for the proposed anesthesia with the patient or authorized representative who has indicated his/her understanding and acceptance.     Dental advisory given  Plan Discussed with: CRNA  Anesthesia Plan Comments:        Anesthesia Quick Evaluation

## 2022-03-14 NOTE — Anesthesia Procedure Notes (Signed)
Procedure Name: MAC Date/Time: 03/14/2022 10:20 AM  Performed by: West Pugh, CRNAPre-anesthesia Checklist: Patient identified, Emergency Drugs available, Suction available, Patient being monitored and Timeout performed Patient Re-evaluated:Patient Re-evaluated prior to induction Oxygen Delivery Method: Simple face mask Preoxygenation: Pre-oxygenation with 100% oxygen Placement Confirmation: positive ETCO2 Dental Injury: Teeth and Oropharynx as per pre-operative assessment

## 2022-03-15 NOTE — Anesthesia Postprocedure Evaluation (Signed)
Anesthesia Post Note  Patient: Randy Shaw  Procedure(s) Performed: COLONOSCOPY WITH PROPOFOL     Patient location during evaluation: Endoscopy Anesthesia Type: MAC Level of consciousness: awake and alert Pain management: pain level controlled Vital Signs Assessment: post-procedure vital signs reviewed and stable Respiratory status: spontaneous breathing, nonlabored ventilation and respiratory function stable Cardiovascular status: stable and blood pressure returned to baseline Postop Assessment: no apparent nausea or vomiting Anesthetic complications: no   No notable events documented.  Last Vitals:  Vitals:   03/14/22 1100 03/14/22 1110  BP: (!) 166/83 (!) 172/96  Pulse: 93 90  Resp: 19 16  Temp:    SpO2: 98% 99%    Last Pain:  Vitals:   03/14/22 1056  TempSrc: Temporal  PainSc:                  Marvene Strohm

## 2022-03-17 ENCOUNTER — Encounter (HOSPITAL_COMMUNITY): Payer: Self-pay | Admitting: Gastroenterology

## 2022-04-07 ENCOUNTER — Encounter: Payer: Self-pay | Admitting: Neurology

## 2022-04-07 DIAGNOSIS — G8250 Quadriplegia, unspecified: Secondary | ICD-10-CM

## 2022-04-07 DIAGNOSIS — G35 Multiple sclerosis: Secondary | ICD-10-CM

## 2022-06-12 ENCOUNTER — Other Ambulatory Visit: Payer: Self-pay | Admitting: Neurology

## 2022-06-26 NOTE — Progress Notes (Unsigned)
PATIENT: Randy Shaw DOB: [birth year]  REASON FOR VISIT: follow up HISTORY FROM: patient  Chief Complaint  Patient presents with   Follow-up    Pt in 5 Pt here for MS f/u  Pt states no changes since last visit  Pt states still has double vision ,left side weakness,    HISTORY OF PRESENT ILLNESS: Today 06/26/22:  Randy Shaw is a 62 y.o. male with a history of Multiple Sclerosis. Returns today for follow-up. Currently on Ocrevus. Can't really tell a difference since he started. Feels that he is overall stable but disease is progressing slowly. No changes with bowels. Has a foley catheter for the last month d/t urinary rentention. Continues to have weakness on the left side that has progressively gotten worse. Lives with his Son. Son helps with ADLS. Uses a motorized wheelchair.  Continues on Keppra- no seizure events.    December 22, 2021 He received tocolytics and IV infusion in May 2023, tolerating it well, no significant change in his daily function, still able to manage transfer himself in and out of wheelchair, cooking, came into office by transportation   Laboratory evaluation showed normal negative TB, RPR, B12, vitamin D level was 14, A1c 5.7, negative hepatitis B, C,  UPDATE Jun 23 2021: We personally reviewed MRI of the brain with without contrast in December 2023, no contrast-enhancement, supratentorium stable MS lesions  Over past few months, He developed new onset bilateral feet paresthesia, Plantar surface needle prick pain, increased lower extremity spasticity, gait abnormality, increased difficulty transferring himself, yesterday, getting up from the toilet, fell,  He also has urinary urgency, taking Flomax 0.4 mg twice a day, is under the care of urologist.   Reviewed extensive laboratory evaluations, which showed normal negative B12, RPR, hepatitis panel, varicella-zoster antibody, CD19, 20, IgG level,   Vitamin D deficiency level was 15.6, mild elevated  A1c 5.7  12/22/21: Randy Shaw, is a 62 year old male, previous patient of Dr. Jannifer Franklin for relapsing remitting multiple sclerosis, return for visit, also with new onset seizure on March 03, 2021   I reviewed and summarized the referring note.PMHX Hypertension Relapsing remitting multiple sclerosis   Patient was diagnosed with remitting multiple sclerosis in July 1995, he presented with right arm numbness, later developed numbness in his left arm, progressive gait difficulty since 2000,.  He also has double vision. He was treated with Avonex in 1995 for one year, was switched to betaseron from 1996 till 2015, when he started to take Tecfidra.  Last flare up was around 2005.   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  The patient uses a wheelchair primarily since 2005. The patient is able to ambulate using a walker but primarily uses the scooter.    Personally reviewed MRI of the brain with and without contrast in May 2021, multiple supra and infratentorial chronic demyelinating plaque, no change compared to March 2019 MRI of cervical spine with without contrast, multiple chronic demyelinating plaque from C2-C7, no change MRI of thoracic spine with without contrast August 16, 2020, scattered foci of abnormal T2 signal within the thoracic spinal cord, no cord compression,    Despite no imaging changes, patient continues complain some worsening symptoms since 2021, has worsening double vision, could no longer move left eye past midline, in addition, he noticed increased weakness of left arm and leg, could no longer lifting up left foot,  Also noticed increased difficulty urinate, required Foley catheter now,  New onset seizure March 03, 2021, that was witnessed by his son, now is  treated with Keppra 500 mg twice a day  He lives with his son, but still quite independent in his daily activity, can transfer himself,  /02/14/21: Randy Shaw  is a 62 y.o. male with  a history of MS. He is here today for a follow up. He remains on the Tecfidera 240 mg twice daily and Baclofen 1 tab in the morning, 1 tab at noon, and 2 tabs at bedtime. He does not take the tizanidine at this time. He recently experienced some urinary retention in which he required a 2 week dose of prednisone. He endorses that his retention issues have resolved and overall his MS is doing well. He reports that he is mostly wheelchair bound and rarely walks. He is currently living with his son. Patient states he is having some LUE stiffness and pain with movement. He has not tried any OTC medications for this and was encouraged to do so. If pain and stiffness persist, encouraged patient to speak with his PCP for further workup.      HISTORY  Copied from Dr. Jannifer Franklin note 08/10/2020 Randy Shaw is a 62 year old right-handed black male with a history of multiple sclerosis.  The patient has a significant problem with weakness in the legs, left greater than right.  He has fallen on occasion when trying to transfer from the bed to the wheelchair.  He has noted over the last several weeks that he has had some problems with voiding the bladder, he has been to the emergency room on 2 occasions on 01 July 2020, and then again on 18 July 2020.  The patient had a Foley catheter placed for about a week each time, but he has not been able to void the bladder once the catheter was removed.  He is followed through urology, he is seen by Dr. Jeffie Pollock.  The patient denies any low back pain or pain down the legs on either side.  He does have some altered sensation of the bladder and bowel, he has some urgency with the bowels, this is chronic for him.  There is some question of benign prostate enlargement.  He has not noted any new numbness or weakness of the arms or new vision changes, he does have chronic double vision when looking to the right.  He last had MRI of the brain and cervical spinal cord about a year ago.  He remains on  Tecfidera.  REVIEW OF SYSTEMS: Out of a complete 14 system review of symptoms, the patient complains only of the following symptoms, and all other reviewed systems are negative.  ALLERGIES: Allergies  Allergen Reactions   Peanut-Containing Drug Products Shortness Of Breath   Zanaflex [Tizanidine Hcl] Shortness Of Breath   Interferon Beta-1a Other (See Comments)    HOME MEDICATIONS: Outpatient Medications Prior to Visit  Medication Sig Dispense Refill   baclofen (LIORESAL) 20 MG tablet TAKE 1 TABLET BY MOUTH IN THE MORNING, 1 TABLET AT NOON, AND 2 TABLETS AT NIGHT (Patient taking differently: Take 20 mg by mouth 4 (four) times daily.) 360 tablet 3   Cholecalciferol (VITAMIN D-3) 125 MCG (5000 UT) TABS Take 5,000 Units by mouth daily.     DULoxetine (CYMBALTA) 60 MG capsule TAKE 1 CAPSULE(60 MG) BY MOUTH DAILY 30 capsule 11   levETIRAcetam (KEPPRA) 500 MG tablet TAKE 1 TABLET(500 MG) BY MOUTH TWICE DAILY 60 tablet 6   losartan-hydrochlorothiazide (HYZAAR) 50-12.5 MG tablet Take  1 tablet by mouth daily.     tamsulosin (FLOMAX) 0.4 MG CAPS capsule Take 0.4 mg by mouth 2 (two) times daily.     No facility-administered medications prior to visit.    PAST MEDICAL HISTORY: Past Medical History:  Diagnosis Date   Abnormality of gait 04/22/2013   Asthma    BPH (benign prostatic hyperplasia)    Hypertension    Multiple sclerosis ([place])    Quadriplegia and quadriparesis ([place]) 03/08/2016    PAST SURGICAL HISTORY: Past Surgical History:  Procedure Laterality Date   COLONOSCOPY WITH PROPOFOL N/A 03/14/2022   Procedure: COLONOSCOPY WITH PROPOFOL;  Surgeon: Otis Brace, MD;  Location: [facility];  Service: Gastroenterology;  Laterality: N/A;   none      FAMILY HISTORY: Family History  Problem Relation Age of Onset   Cancer Mother        breast   Stroke Father    Cancer - Colon Father    Multiple sclerosis Neg Hx     SOCIAL HISTORY: Social History   Socioeconomic History    Marital status: Divorced    Spouse name: n/a   Number of children: 3   Years of education: 12   Highest education level: Not on file  Occupational History   Occupation: disabled/retired 1999-[place]    Comment: Education officer, museum  Tobacco Use   Smoking status: Never   Smokeless tobacco: Never  Substance and Sexual Activity   Alcohol use: Not Currently    Alcohol/week: 0.0 standard drinks of alcohol    Comment: occasionally wine cooler   Drug use: No   Sexual activity: Never  Other Topics Concern   Not on file  Social History Narrative   Lives alone, house.     Right Handed   Drinks 2-3 cups caffeine   Social Determinants of Health   Financial Resource Strain: Not on file  Food Insecurity: Not on file  Transportation Needs: Not on file  Physical Activity: Not on file  Stress: Not on file  Social Connections: Not on file  Intimate Partner Violence: Not on file      PHYSICAL EXAM  Vitals:   06/27/22 1049 06/27/22 1107  BP: (!) 159/88 (!) 150/90  Pulse: (!) 104   Height: '6\' 3"'$  (1.905 m)     Body mass index is 25 kg/m.  Generalized: Well developed, in no acute distress   Neurological examination  Mentation: Alert oriented to time, place, history taking. Follows all commands speech and language fluent Cranial nerve II-XII: Pupils were equal round reactive to light.  Left INO. facial sensation and strength were normal. Uvula tongue midline. Head turning and shoulder shrug  were normal and symmetric. Motor: The motor testing reveals 5/5 strength in RUE 4/5 RLE and 4/5 LUE 3/5 LLE. Marland Kitchen Foot drop noted to the LLE. Sensory: Sensory testing is intact to soft touch on all 4 extremities. No evidence of extinction is noted.  Coordination: Cerebellar testing reveals ataxia bilaterally in the finger-nose-finger.  Gait and station: Not ambulated, patient is wheelchair bound.    DIAGNOSTIC DATA (LABS, IMAGING, TESTING) - I reviewed patient records, labs, notes, testing  and imaging myself where available.  Lab Results  Component Value Date   WBC 5.0 12/22/2021   HGB 15.3 03/14/2022   HCT 45.0 03/14/2022   MCV 82 12/22/2021   PLT 209 12/22/2021      Component Value Date/Time   NA 140 03/14/2022 1009   NA 143 12/22/2021 1403   K 3.3 (L)  03/14/2022 1009   CL 106 03/14/2022 1009   CO2 24 12/22/2021 1403   GLUCOSE 123 (H) 03/14/2022 1009   BUN 18 03/14/2022 1009   BUN 15 12/22/2021 1403   CREATININE 1.00 03/14/2022 1009   CREATININE 0.89 07/30/2013 0952   CALCIUM 9.7 12/22/2021 1403   PROT 6.5 12/22/2021 1403   ALBUMIN 3.9 12/22/2021 1403   AST 20 12/22/2021 1403   ALT 17 12/22/2021 1403   ALKPHOS 78 12/22/2021 1403   BILITOT 0.4 12/22/2021 1403   GFRNONAA >60 03/04/2021 0540   GFRNONAA >89 07/30/2013 0952   GFRAA 107 02/05/2020 1144   GFRAA >89 07/30/2013 0952   Lab Results  Component Value Date   CHOL 233 (H) 01/01/2019   HDL 67 01/01/2019   LDLCALC 149 (H) 01/01/2019   TRIG 98 01/01/2019   CHOLHDL 3.5 01/01/2019   Lab Results  Component Value Date   HGBA1C 5.7 (H) 03/10/2021   Lab Results  Component Value Date   [id number] 03/10/2021   Lab Results  Component Value Date   TSH 2.280 12/22/2021      ASSESSMENT AND PLAN 62 y.o. year old male  has a past medical history of Abnormality of gait (04/22/2013), Asthma, BPH (benign prostatic hyperplasia), Hypertension, Multiple sclerosis ([place]), and Quadriplegia and quadriparesis ([place]) (03/08/2016). here with .    1. Multiple sclerosis-secondary progressive 2.  Seizures  - Continue Ocrevus infusions -Continue Baclofen 20 mg 4 times a day -Continue Keppra 500 mg twice a day -Repeat MRI with and without contrast of the brain, cervical spine and thoracic spine -Blood work today -Follow up in 6 months or sooner if needed.   Ward Givens, MSN, NP-C 06/26/2022, 2:50 PM [facility] [address] [place], [place] [address] [phone]

## 2022-06-27 ENCOUNTER — Encounter: Payer: Self-pay | Admitting: Adult Health

## 2022-06-27 ENCOUNTER — Ambulatory Visit (INDEPENDENT_AMBULATORY_CARE_PROVIDER_SITE_OTHER): Payer: Commercial Managed Care - HMO | Admitting: Adult Health

## 2022-06-27 VITALS — BP 150/90 | HR 104 | Ht 75.0 in

## 2022-06-27 DIAGNOSIS — G35 Multiple sclerosis: Secondary | ICD-10-CM | POA: Diagnosis not present

## 2022-06-27 DIAGNOSIS — Z5181 Encounter for therapeutic drug level monitoring: Secondary | ICD-10-CM | POA: Diagnosis not present

## 2022-06-27 DIAGNOSIS — G40909 Epilepsy, unspecified, not intractable, without status epilepticus: Secondary | ICD-10-CM

## 2022-06-28 LAB — COMPREHENSIVE METABOLIC PANEL
ALT: 15 IU/L (ref 0–44)
AST: 16 IU/L (ref 0–40)
Albumin/Globulin Ratio: 1.8 (ref 1.2–2.2)
Albumin: 4.2 g/dL (ref 3.9–4.9)
Alkaline Phosphatase: 84 IU/L (ref 44–121)
BUN/Creatinine Ratio: 13 (ref 10–24)
BUN: 14 mg/dL (ref 8–27)
Bilirubin Total: 0.2 mg/dL (ref 0.0–1.2)
CO2: 25 mmol/L (ref 20–29)
Calcium: 9.8 mg/dL (ref 8.6–10.2)
Chloride: 105 mmol/L (ref 96–106)
Creatinine, Ser: 1.12 mg/dL (ref 0.76–1.27)
Globulin, Total: 2.3 g/dL (ref 1.5–4.5)
Glucose: 115 mg/dL — ABNORMAL HIGH (ref 70–99)
Potassium: 3.7 mmol/L (ref 3.5–5.2)
Sodium: 146 mmol/L — ABNORMAL HIGH (ref 134–144)
Total Protein: 6.5 g/dL (ref 6.0–8.5)
eGFR: 74 mL/min/{1.73_m2} (ref >59)

## 2022-06-28 LAB — CBC WITH DIFFERENTIAL/PLATELET
Basophils Absolute: 0.1 10*3/uL (ref 0.0–0.2)
Basos: 1 %
EOS (ABSOLUTE): 0.3 10*3/uL (ref 0.0–0.4)
Eos: 7 %
Hematocrit: 40.3 % (ref 37.5–51.0)
Hemoglobin: 12.8 g/dL — ABNORMAL LOW (ref 13.0–17.7)
Immature Grans (Abs): 0 10*3/uL (ref 0.0–0.1)
Immature Granulocytes: 0 %
Lymphocytes Absolute: 1.5 10*3/uL (ref 0.7–3.1)
Lymphs: 31 %
MCH: 26.9 pg (ref 26.6–33.0)
MCHC: 31.8 g/dL (ref 31.5–35.7)
MCV: 85 fL (ref 79–97)
Monocytes Absolute: 0.4 10*3/uL (ref 0.1–0.9)
Monocytes: 8 %
Neutrophils Absolute: 2.5 10*3/uL (ref 1.4–7.0)
Neutrophils: 53 %
Platelets: 233 10*3/uL (ref 150–450)
RBC: 4.76 x10E6/uL (ref 4.14–5.80)
RDW: 12.1 % (ref 11.6–15.4)
WBC: 4.7 10*3/uL (ref 3.4–10.8)

## 2022-06-28 LAB — IGG, IGA, IGM
IgA/Immunoglobulin A, Serum: 244 mg/dL (ref 61–437)
IgG (Immunoglobin G), Serum: 952 mg/dL (ref 603–1613)
IgM (Immunoglobulin M), Srm: 97 mg/dL (ref 20–172)

## 2022-06-29 ENCOUNTER — Telehealth: Payer: Self-pay | Admitting: Adult Health

## 2022-06-29 NOTE — Telephone Encounter (Signed)
Sent to GI, they obtain Novella Rob and [place] is pending [phone]

## 2022-07-03 NOTE — Telephone Encounter (Signed)
[facility] Josem KaufmannQE:[id number] exp.  06/29/2022-07/29/2022

## 2022-07-10 ENCOUNTER — Telehealth: Payer: Self-pay | Admitting: *Deleted

## 2022-07-10 DIAGNOSIS — G35 Multiple sclerosis: Secondary | ICD-10-CM

## 2022-07-10 DIAGNOSIS — G8250 Quadriplegia, unspecified: Secondary | ICD-10-CM

## 2022-07-10 NOTE — Telephone Encounter (Signed)
Received fax from [place], Andria Rhein.  Order to be signed by MM/NP, then faxed back to [facility].  For Wheelchair Evaluation by a PT or OT.

## 2022-07-10 NOTE — Telephone Encounter (Signed)
Wyvonnia Lora, RN  Gildardo Griffes, RN; Brandon Melnick, RN; Skeet Simmer A, CMA Pt appt was with Hedwig Morton. Can you all f/u with Debbie on this? Thank you!     Previous Messages    ----- Message ----- From: Shela Nevin Sent: 07/07/2022   7:58 PM EDT To: Wyvonnia Lora, RN  Hi Emma,  Can you please direct me who to follow up with on patient's power wheelchair please. I faxed paperwork on 06/26/22 for appt on 06/27/22 but appears patient was seen on 06/26/22 and it doesn't look like the power wheelchair was addressed in the note.  Could another appointment be scheduled with Ward Givens, NP for the Power Wheelchair. Please let me know and I will send paperwork again.  Thank you! [place] PH: [phone]

## 2022-07-10 NOTE — Telephone Encounter (Signed)
I called and LMVM for Randy Shaw at [facility] to refax p/w for pt power wheelchair to [phone]. To address.

## 2022-07-26 ENCOUNTER — Encounter: Payer: Self-pay | Admitting: Adult Health

## 2022-08-02 NOTE — Telephone Encounter (Signed)
Message Received: Today Georgena Spurling, RN Yes ma'am. Thank you We are waiting for a date for to schedule his PT eval for wheelchair.     Previous Messages    ----- Message ----- From: Brandon Melnick, RN Sent: 07/31/2022  10:53 AM EDT To: Shela Nevin  Did you get what was needed for this pt?  Lovey Newcomer RN ----- Message ----- From: Shela Nevin Sent: 07/10/2022   7:23 PM EDT To: Brandon Melnick, RN  Perfect thank you! Let me know please when you fax it back and I will be on the look out for it!  ----- Message ----- From: Brandon Melnick, RN Sent: 07/10/2022   5:17 PM EDT To: Shela Nevin  Received, placed in MM/NP in box to sign off.  Lovey Newcomer RN   ----- Message ----- From: Shela Nevin Sent: 07/10/2022   4:46 PM EDT To: Brandon Melnick, RN  Terence Lux,  After further reviewing this will you ask Jinny Blossom to sign the order for the PT Mobility/Seating Evaluation and I will get patient scheduled for the eval and then once that is completed, we will request a F2F appointment at that time.  I am faxing the order over now for signature. Sorry for any confusion and feel free to call me if you have any questions.  Thank you!  ----- Message ----- From: Shela Nevin Sent: 07/10/2022   4:37 PM EDT To: Brandon Melnick, RN  Hello -  I got your VM and meant to call you back but the day got away from me!  It looks like patient will need a new appointment scheduled for the power wheelchair since the 06/26/22 appointment didn't address patient was being seen for the power wheelchair.  I will refax the paperwork to you at the fax you left on my VM requesting new appointment. Please let me know if you have any questions!  Thank you! [place] PH: [phone]

## 2022-08-03 ENCOUNTER — Other Ambulatory Visit: Payer: Commercial Managed Care - HMO

## 2022-08-07 ENCOUNTER — Ambulatory Visit
Admission: RE | Admit: 2022-08-07 | Discharge: 2022-08-07 | Disposition: A | Discharge status: Home / Self Care | Payer: Commercial Managed Care - HMO | Source: Ambulatory Visit | Attending: Adult Health | Admitting: Adult Health

## 2022-08-07 DIAGNOSIS — G35 Multiple sclerosis: Secondary | ICD-10-CM

## 2022-08-07 MED ORDER — GADOPICLENOL 0.5 MMOL/ML IV SOLN
10.0000 mL | Freq: Once | INTRAVENOUS | Status: AC | PRN
  Administered 2022-08-07: 10 mL via INTRAVENOUS

## 2022-08-20 ENCOUNTER — Ambulatory Visit
Admission: RE | Admit: 2022-08-20 | Discharge: 2022-08-20 | Disposition: A | Discharge status: Home / Self Care | Payer: Commercial Managed Care - HMO | Source: Ambulatory Visit | Attending: Adult Health | Admitting: Adult Health

## 2022-08-20 DIAGNOSIS — G35 Multiple sclerosis: Secondary | ICD-10-CM

## 2022-08-20 MED ORDER — GADOPICLENOL 0.5 MMOL/ML IV SOLN
10.0000 mL | Freq: Once | INTRAVENOUS | Status: AC | PRN
  Administered 2022-08-20: 10 mL via INTRAVENOUS

## 2022-08-28 ENCOUNTER — Other Ambulatory Visit: Payer: Self-pay | Admitting: Neurology

## 2022-09-21 ENCOUNTER — Ambulatory Visit: Payer: Medicaid Other | Attending: Adult Health | Admitting: Physical Therapy

## 2022-09-21 DIAGNOSIS — M6281 Muscle weakness (generalized): Secondary | ICD-10-CM | POA: Diagnosis present

## 2022-09-21 DIAGNOSIS — R2689 Other abnormalities of gait and mobility: Secondary | ICD-10-CM | POA: Diagnosis present

## 2022-09-21 NOTE — Therapy (Unsigned)
OUTPATIENT PHYSICAL THERAPY WHEELCHAIR EVALUATION   Patient Name: Randy Shaw MRN: [id number] DOB:08-13-1960, 62 y.o., male Today's Date: 09/26/2022  END OF SESSION:  Only addendum made to this note was to send cert to physician.   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   Past Surgical History:  Procedure Laterality Date   COLONOSCOPY WITH PROPOFOL N/A 03/14/2022   Procedure: COLONOSCOPY WITH PROPOFOL;  Surgeon: Kathi Der, MD;  Location: [facility];  Service: Gastroenterology;  Laterality: N/A;   none     Patient Active Problem List   Diagnosis Date Noted   Fall 06/23/2021   Neuropathic pain 06/23/2021   Urinary urgency 06/23/2021   Aneurysm of ascending aorta without rupture (HCC) 04/06/2021   Seizure disorder (HCC) 03/10/2021   Urinary incontinence 03/10/2021   Other fatigue 03/10/2021   Urinary retention 03/10/2021   New onset seizure (HCC) 03/03/2021   COVID    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   Abnormality of gait 04/22/2013   HTN (hypertension) 06/29/2012   Multiple sclerosis (HCC) 06/29/2012   Benign prostatic hyperplasia with urinary obstruction 06/29/2012    PCP: Butch Penny, NP  REFERRING PROVIDER: Butch Penny, NP  THERAPY DIAG:  Muscle weakness (generalized)  Other abnormalities of gait and mobility  Rationale for Evaluation and Treatment Rehabilitation  SUBJECTIVE:                                                                                                                                                                                           SUBJECTIVE STATEMENT: Pt present for wheelchair evaluation. Patient last seen at this clinic for wheelchair evaluation back in April 2018. Patient reports history of multiple sclerosis  diagnosed in 1995. The patient has since noticed a gradual decline in function and has been primarily wheelchair bound since 2012. Patient reports that his chair has been in increased disrepair in recent months making it more challenging to move safely around home. Patient reports that he has had numerous falls (6-8 in the last 6 months) when attempting to do transfers.  Patient reports pain with intermittent muscle cramps. Patient arrives to today's session anticipating new wheelchair.  PRECAUTIONS: Fall  WEIGHT BEARING RESTRICTIONS No    OCCUPATION: Retired - worked as a Emergency planning/management officer  PLOF:   Modified Independent    PATIENT GOALS: "I just want to be able to move around easier in my chair."      MEDICAL HISTORY:  Primary diagnosis onset: Multiple Sclerosis Diagnosis  Code:  Diagnosis:    Diagnosis code: G35      Diagnosis:   Diagnosis  Code:  Diagnosis:   [x] Progressive disease  Relevant future surgeries:     Height: 6\' 3"  Weight: 200 lbs Explain recent changes or trends in weight:  Patient reports losing weight over last five years. Current chair is too wide given weight.     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            Cardio Status:  Functional Limitations:   [x] Intact  []  Impaired      Respiratory Status:  Functional Limitations:   [x] Intact  [] Impaired   [] SOB [] COPD [] O2 Dependent ______LPM  [] Ventilator Dependent  Resp equip:                                                     Objective Measure(s):   Orthotics:   [] Amputee:                                                             [] Prosthesis:     HOME ENVIRONMENT:  [x] House [] Condo/town home [] Apartment [] Asst living [] LTCF         [x] Own  [] Rent   [] Lives alone [x] Lives with others - son                   Hours without  assistance: 35 hours a week  [x] Home is accessible to patient                                 Storage of wheelchair:  [x] In home   [] Other Comments:        COMMUNITY :  TRANSPORTATION:  [] Car [] Presenter, broadcasting [] Adapted w/c Lift []  Ambulance [] Other:                     [] Sits in wheelchair during transport   Where is w/c stored during transport?  Transport vehicle adapted for [employer] [x] [employer]  []  [employer]  r   [] Self-Driver       Drive while in  Biomedical scientist [] yes [x] no   Employment and/or school:   Retired       Other: Power wheelchair is primary mode of transportation for all aspects of life. Uses to move room to room. Spends all day in chair other than when transferring for toileting/bathing/bed. Patient cooks and performs other ADLs from chair.   COMMUNICATION:  Verbal Communication  [x] WFL [] receptive [] WFL [] expressive [] Understandable  [] Difficult to understand  [] non-communicative  Primary Language:___English___________ 2nd:_____________  Communication provided by:[x] Patient [] Family [] Caregiver [] Translator   [] Uses an augmentative communication device     Manufacturer/Model :     MOBILITY/BALANCE:  Sitting Balance  Standing Balance  Transfers  Ambulation   [] WFL      [] WFL  []   Independent  []  Independent   [] Uses UE for balance in sitting Comments:  [] Uses UE/device for stability Comments:  []  Min assist  []  Ambulates independently with       device:___________________      []  Mod assist  []  Able to ambulate ______ feet        safely/functionally/independently   [x]  Min assist - uses UE strength until fatigues and then benefits from external support  []  Min assist  [x]  Max assist - patient self reports being able to transfer at home with grab bars (though reports numerous recent falls related to transfers) more independently; however, based on transfers during session from wheelchair to mat required maxA to prevent fall []  Non-functional ambulator          History/High risk of falls   []  Mod assist  []  Mod assist  []  Dependent  [x]  Unable to ambulate   []  Max  assist  [x]  Max assist - due to extent of tone extremely unsafe Transfer method:[x] 1 person [] 2 person [] sliding board [x] squat pivot [x] stand pivot [] mechanical patient lift  [] other:   Increased ease going to right. Extensor tone increases difficulty when patient in stand. Poor LE control  []  Unable  []  Unable    Fall History: # of falls in the past 6 months? 6-8 falls # of "near" falls in the past 6 months? Nearly one every day    CURRENT SEATING / MOBILITY:  Current Mobility Device: [] None [] Cane/Walker [] Manual [] Dependent [] Dependent w/ Tilt rScooter  [x] Power (type of control):   Manufacturer: Information systems manager: Edge 2 Serial #:   Size: 18"x20" Color:  Age: > 5 years  Purchased by whom:   Current condition of mobility base: broken joystick mount, seating is worn/torn  Current seating system:  Unable to determine given state of disrepair, patient has self-purchased cushion topper as prior seat cushion not able to provide adequate relief given current state                                                                     Age of seating system:  > 5 years  Describe posture in present seating system: Posterior pelvic tilt, hips shifted to the L with legs hyperextended and adducted to the right.    Is the current mobility meeting medical necessity?:  [] Yes [x] No Describe: Significant disrepair making transfers more difficult likely increasing patient's current risk of falls including lack of swing away feature on joystick and cushion state making it difficult for patient.                          Ability to complete Mobility-Related Activities of Daily Living (MRADL's) with Current Mobility Device:   Move room to room  [x] Independent  [] Min [] Mod [] Max assist  [] Unable  Comments: Requires assistance from son with activities as noted.   Meal prep  [x] Independent  [] Min [] Mod [] Max assist   [] Unable    Feeding  [x] Independent  [] Min [] Mod [] Max assist  [] Unable    Bathing  [x] Independent  [] Min [] Mod [] Max assist  [] Unable    Grooming  [] Independent  [x] Min [] Mod [] Max assist  [] Unable    UE dressing  [] Independent  [x] Min [] Mod [] Max assist  []   Unable    LE dressing  [] Independent   [] Min [x] Mod [] Max assist  [] Unable    Toileting  [] Independent  [x] Min [] Mod [] Max assist  [] Unable    Bowel Mgt: []  Continent [x]  Incontinent []  Accidents [x]  Diapers []  Colostomy [x]  Bowel Program: Transfers to toilet as needed; briefs as precaution  Bladder Mgt: []  Continent [x]  Incontinent []  Accidents []  Diapers []  Urinal []  Intermittent Cath [x]  Indwelling Cath []  Supra-pubic Cath     Current Mobility Equipment Trialed/ Ruled Out:    Does not meet mobility needs due to:    Mark all boxes that indicate inability to use the specific equipment listed     Meets needs for safe  independent functional  ambulation  / mobility    Risk of  Falling or History of Falls    Enviromental limitations      Cognition    Safety concerns with  physical ability    Decreased / limitations endurance  & strength     Decreased / limitations  motor skills  & coordination    Pain    Pace /  Speed    Cardiac and/or  respiratory condition    Contra - indicated by diagnosis   Cane/Crutches  []   [x]   []   []   [x]   [x]   [x]   [x]   [x]   []   []    Walker / Rollator  []  NA   []   [x]   []   []   [x]   [x]   [x]   [x]   [x]   []   []     Manual Wheelchair [id number]:  []  NA  []   [x]   []   []   [x]   [x]   [x]   [x]   [x]   []   []    Manual W/C (K0005) with power assist  []  NA  []   [x]   []   []   [x]   [x]   [x]   [x]   []   []   []    Scooter  []  NA  []   [x]   []   []   [x]   [x]   [x]   [x]   []   []   []    Power Wheelchair: standard joystick  []  NA  [x]   []   []   []   []   []   []   []   []   []   []    Power Wheelchair: alternative controls  [x]  NA  []   []   []   []   []   []   []   []   []   []   []    Summary:  The least costly alternative for independent  functional mobility was found to be:    []  Crutch/Cane  []  Walker []  Manual w/c  []  Manual w/c with power assist   []  Scooter   [x]  Power w/c std joystick   []  Power w/c alternative control        []  Requires dependent care mobility device   Cabin crew for [employer] skills are adequate for safe mobility equipment operation  [x]   Yes []   No  Patient is willing and motivated to use recommended mobility equipment  [x]   Yes []   No       []  Patient is unable to safely operate mobility equipment independently and requires dependent care equipment Comments:           SENSATION and SKIN ISSUES:  Sensation []  Intact  [x]  Impaired []  Absent []  Hyposensate []  Hypersensate  []  Defensiveness  Location(s) of impairment: grossly throughout LE   Pressure Relief Method(s):  []  Lean side to side to offload (  without risk of falling)  []   W/C push up (4+ times/hour for 15+ seconds) []  Stand up (without risk of falling)    []  Other: (Describe): Effective pressure relief method(s) above can be performed consistently throughout the day: NO If not, Why?: Patient is not effective with pressure relief. Given extent of UE impairment unable to fully clear or side shift to allow complete pressure relief. Patient is also unsafe to safely stand without risk of falling.   Skin Integrity Risk:       []  Low risk           []  Moderate risk            [x]  High risk  If high risk, explain: reports frequent cuts and scabs on feet when attempting to perform transfer from current chair, Also, given frequent accidents and sensation challenges patient is at a very high risk for skin breakdown.   Skin Issues/Skin Integrity  Current skin Issues  []  Yes []  No []  Intact  [x]   Red area   [x]   Open area  []  Scar tissue  []  At risk from prolonged sitting  Where: buttom History of Skin Issues  [x]  Yes []  No Where : Legs When: intermittent after bumping into objects with transfers Stage: Unable  to assess given pants Hx of skin flap surgeries  []  Yes [x]  No Where:  When:  Pain: [x]  Yes []  No   Pain Location(s): cramping in lower extremity with muscle spasms Intensity scale: (0-10) : Intermittent unable to provide consistent rating How does pain interfere with mobility and/or MRADLs? - makes transfers extremely difficult        MAT EVALUATION:  Neuro-Muscular Status: (Tone, Reflexive, Responses, etc.)     []   Intact   [x]  Spasticity:  []  Hypotonicity  []  Fluctuating  []  Muscle Spasms  []  Poor Righting Reactions/Poor Equilibrium Reactions  []  Primal Reflex(s):    Comments:   Left spasticity > right spasticity          COMMENTS:    POSTURE:     Comments:  Pelvis Anterior/Posterior:  []  Neutral   [x]  Posterior  []  Anterior  []  Fixed - No movement [x]  Tendency away from neutral [x]  Flexible [x]  Self-correction [x]  External correction Obliquity (viewed from front)  []  WFL []  R Obliquity [x]  L Obliquity  []  Fixed - No movement [x]  Tendency away from neutral [x]  Flexible [x]  Self-correction [x]  External correction Rotation  [x]  WFL []  R anterior []  L anterior  []  Fixed - No movement []  Tendency away from neutral []  Flexible []  Self-correction []  External correction Tonal Influence Pelvis:  []  Normal []  Flaccid []  Low tone [x]  Spasticity []  Dystonia []  Pelvis thrust []  Other:    Trunk Anterior/Posterior:  []  WFL []  Thoracic kyphosis []  Lumbar lordosis  []  Fixed - No movement [x]  Tendency away from neutral [x]  Flexible [x]  Self-correction [x]  External correction  [x]  WFL []  Convex to left  []  Convex to right []  S-curve   []  C-curve []  Multiple curves []  Tendency away from neutral []  Flexible []  Self-correction []  External correction Rotation of shoulders and upper trunk:  []  Neutral [x]  Left-anterior []  Right- anterior []  Fixed- no movement []  Tendency away from neutral [x]  Flexible [x]  Self correction []  External  correction Tonal influence Trunk:  []  Normal []  Flaccid []  Low tone [x]  Spasticity []  Dystonia []  Other:   Head & Neck  [x]  Functional []  Flexed    []  Extended []  Rotated right  []   Rotated left []  Laterally flexed right []  Laterally flexed left []  Cervical hyperextension   [x]  Good head control []  Adequate head control []  Limited head control []  Absent head control Describe tone/movement of head and neck: WNL     Lower Extremity Measurements: LE ROM:  Extensor hypertonicity limits ROM resulting in variation in ROM, L spastiicty > R, able to achieve > 90/90 of knee flexion with PROM  LE MMT:  Unable to achieve accurate assessment given spasticity  Hip positions:  []  Neutral   []  Abducted   [x]  Adducted  []  Subluxed   []  Dislocated   []  Fixed   [x]  Tendency away from neutral [x]  Flexible [x]  Self-correction []  External correction My Note    Hip Windswept:[]  Neutral  [x]  Right    []  Left  []  Subluxed   []  Dislocated   []  Fixed   [x]  Tendency away from neutral [x]  Flexible []  Self-correction [x]  External correction  LE Tone: []  Normal []  Low tone [x]  Spasticity []  Flaccid []  Dystonia []  Rocks/Extends at hip []  Thrust into knee extension []  Pushes legs downward into footrest  Foot positioning: ROM Concerns: Dorsiflexed: []  Right   []  Left Plantar flexed: [x]  Right    [x]  Left Inversion: [x]  Right    [x]  Left Eversion: []  Right    []  Left  LE Edema: []  1+ (Barely detectable impression when finger is pressed into skin) [x]  2+ (slight indentation. 15 seconds to rebound) []  3+ (deeper indentation. 30 seconds to rebound) []  4+ (>30 seconds to rebound)  UE Measurements:  UPPER EXTREMITY ROM:   Active ROM Right 09/26/2022 Left 09/26/2022  Shoulder flexion 85 120  Shoulder abduction    Shoulder adduction    Elbow flexion    Elbow extension -10 -27  Wrist flexion    Wrist extension    (Blank rows = not tested)  UPPER EXTREMITY MMT: Extensor  hypertonicity limits ROM resulting in variation in ROM, L spastiicty > R  Shoulder Posture:  Right Tendency towards Left  []   Functional []    [x]   Elevation [x]    []   Depression []    []   Protraction []    [x]   Retraction [x]    []   Internal rotation []    []   External rotation []    []   Subluxed []     UE Tone: []  Normal []  Flaccid []  Low tone [x]  Spasticity  []  Dystonia []  Other:   UE Edema: [x]  1+ (Barely detectable impression when finger is pressed into skin) []  2+ (slight indentation. 15 seconds to rebound) []  3+ (deeper indentation. 30 seconds to rebound) []  4+ (>30 seconds to rebound)  Wrist/Hand: Handedness: []  Right   []  Left   []  NA: Comments:  Right  Left  []   WNL []    [x]   Limitations [x]    []   Contractures []    [x]   Fisting [x]    []   Tremors []    [x]   Weak grasp [x]    [x]   Poor dexterity [x]    []   Hand movement non functional []    []   Paralysis []      Patient unable to safely ambulate. Requires maxA x 1 during session for transfers as noted above.  MOBILITY BASE RECOMMENDATIONS and JUSTIFICATION:  MOBILITY BASE  JUSTIFICATION   Manufacturer:   Quantum Model:    Edge 3                          Color: Midnight Blue Seat Width:  17" Seat Depth 20"   []  Manual mobility base (continue below)   []  Scooter/POV  [x]  Power mobility base   Number of hours per day spent in above selected mobility base: 12  Typical daily mobility base use is planned for duration of day for ADLs. Primary mode of mobility for patient as patient is nonambulatory. Schedule: Morning to Evening   [x]  is not a safe, functional ambulator  [x]  limitation prevents from completing a MRADL(s) within a reasonable time frame    [x]  limitation places at high risk of morbidity or mortality secondary to  the attempts to perform a    MRADL(s)  [x]  limitation prevents accomplishing a MRADL(s) entirely  [x]  provide independent mobility  [x]  equipment is a lifetime medical need  [x]  walker or  cane inadequate  [x]  any type manual wheelchair      inadequate  [x]  scooter/POV inadequate      []  requires dependent mobility        POWER MOBILITY      []  Scooter/POV    []  can safely operate   []  can safely transfer   []  has adequate trunk stability   []  cannot functionally propel  manual wheelchair    [x]  Power mobility base    [x]  non-ambulatory   [x]  cannot functionally propel manual wheelchair   [x]  cannot functionally and safely      operate scooter/POV  [x]  can safely operate power       wheelchair  [x]  home is accessible  [x]  willing to use power wheelchair     Tilt  [x]  Powered tilt on powered chair  []  Powered tilt on manual chair  []  Manual tilt on manual chair Comments:  [x]  change position for pressure      [x]  relief/cannot weight shift   [x]  change position against      gravitational force on head and      shoulders   [x]  decrease pain  []  blood pressure management   []  control autonomic dysreflexia  []  decrease respiratory distress  [x]  management of spasticity  []  management of low tone  [x]  facilitate postural control   [x]  rest periods   []  control edema  [x]  increase sitting tolerance   [x]  aid with transfers     Recline   []  Power recline on power chair  []  Manual recline on manual chair  Comments:    []  intermittent catheterization  []  manage spasticity  []  accommodate femur to back angle  []  change position for pressure relief/cannot weight shift rhigh risk of pressure sore development  []  tilt alone does not accomplish     effective pressure relief, maximum pressure relief achieved at -      _______ degrees tilt   _______ degrees recline   []  difficult to transfer to and from bed []  rest periods and sleeping in chair  []  repositioning for transfers  []  bring to full recline for ADL care  []  clothing/diaper changes in chair  []  gravity PEG tube feeding  []  head positioning  []  decrease pain  []  blood pressure management   []   control autonomic dysreflexia  []  decrease respiratory distress  []  user on ventilator     Elevator on mobility base  []  Power wheelchair  []  Scooter  []  increase Indep in transfers   []  increase Indep in ADLs    []  bathroom function and safety  []  kitchen/cooking function and safety  []  shopping  []  raise height for communication  at standing level  []  raise height for eye contact which reduces cervical neck strain and pain  []  drive at raised height for safety and navigating crowds  []  Other:   []  Vertical position system  (anterior tilt)     (Drive locks-out)    []  Stand       (Drive enabled)  []  independent weight bearing  []  decrease joint contractures  []  decrease/manage spasticity  []  decrease/manage spasms  []  pressure distribution away from   scapula, sacrum, coccyx, and ischial tuberosity  []  increase digestion and elimination   []  access to counters and cabinets  []  increase reach  []  increase interaction with others at eye level, reduces neck strain  []  increase performance of       MRADL(s)      Power elevating legrest    [x]  Center mount (Single) 85-170 degrees       []  Standard (Pair) 100-170 degrees  [x]  position legs at 90 degrees, not available with std power ELR  [x]  center mount tucks into chair to decrease turning radius in home, not available with std power ELR  [x]  provide change in position for LE  [x]  elevate legs during recline    [x]  maintain placement of feet on      footplate  [x]  decrease edema  []  improve circulation  []  actuator needed to elevate legrest  []  actuator needed to articulate legrest preventing knees from flexing  [x]  Increase ground clearance over      curbs  []   STD (pair) independently                     elevate legrest   POWER WHEELCHAIR CONTROLS      Controls/input device  []  Expandable  [x]  Non-expandable  [x]  Proportional  [x]  Right Hand []  Left Hand  []  Non-proportional/switches/head-array  []  Electrical/proximity          []   Mechanical      Manufacturer:___Quantum________________   Type:_____Joystick_______________ [x]  provides access for controlling wheelchair  []  programming for accurate control  []  progressive disease/changing condition  []  required for alternative drive      controls       []  lacks motor control to operate  proportional drive control  []  unable to understand proportional controls  []  limited movement/strength  []  extraneous movement / tremors / ataxic / spastic       [x]  Upgraded electronics controller/harness    []  Single power (tilt or recline)   []  Expandable    [x]  Non-expandable plus   [x]  Multi-power (tilt, recline, power legrest, power seat lift, vertical positioning system, stand)  [x]  allows input device to communicate with drive motors  [x]  harness provides necessary connections between the controller, input device, and seat functions     [x]  needed in order to operate power seat functions through joystick/ input device  []  required for alternative drive controls     []  Enhanced display  []  required to connect all alternative drive controls   []  required for upgraded joystick      (lite-throw, heavy duty, micro)  []  Allows user to see in which mode and drive the wheelchair is set; necessary for alternate controls       []  Upgraded tracking electronics  []  correct tracking when on uneven surfaces makes switch driving more efficient and less fatiguing  []  increase safety when driving  []  increase ability to traverse thresholds    []  Safety / reset / mode  switches     Type:    []  Used to change modes and stop the wheelchair when driving     [x]  Mount for joystick / input device/switches  [x]  swing away for access or transfers   [x]  attaches joystick / input device / switches to wheelchair   []  provides for consistent access  []  midline for optimal placement    []  Attendant controlled joystick plus     mount  []  safety  []  long distance driving  []  operation  of seat functions  []  compliance with transportation regulations    [x]  Battery  [x]  required to power (power assist / scooter/ power wc / other):   []  Power inverter (24V to 12V)  []  required for ventilator / respiratory equipment / other:     CHAIR OPTIONS MANUAL & POWER      Armrests   [x]  adjustable height []  removable  []  swing away []  fixed  [x]  flip back  []  reclining  [x]  full length pads []  desk []  tube arms []  gel pads  [x]  provide support with elbow at 90    [x]  remove/flip back/swing away for  transfers  [x]  provide support and positioning of upper body    [x]  allow to come closer to table top  [x]  remove for access to tables  []  provide support for w/c tray  [x]  change of height/angles for       variable activities   []  Elbow support / Elbow stop  []  keep elbow positioned on arm pad  []  keep arms from falling off arm pad  during tilt and/or recline   Upper Extremity Support  []  Arm trough  []   R  []   L  Style:  []  swivel mount []  fixed mount   []  posterior hand support  []   tray  []  full tray  []  joystick cut out  []   R  []   L  Style:  []  decrease gravitational pull on      shoulders  []  provide support to increase UE  function  []  provide hand support in natural    position  []  position flaccid UE  []  decrease subluxation    []  decrease edema       []  manage spasticity   []  provide midline positioning  []  provide work surface  []  placement for AAC/ Computer/ EADL       Hangers/ Legrests   []  ______ degree  []  Elevating []  articulating  []  swing away []  fixed []  lift off  []  heavy duty []  adjustable knee angle  []  adjustable calf panel   []  longer extension tube              []  provide LE support  []  maintain placement of feet on      footplate   []  accommodate lower leg length  []  accommodate to hamstring       tightness  []  enable transfers  []  provide change in position for LE's  []  elevate legs during recline    []  decrease edema  []  durability       Foot support   [x]  footplate []  R []  L [x]  flip up           [x]  Depth adjustable   [x]  angle adjustable  []  foot board/one piece    [x]  provide foot support  [x]  accommodate to ankle ROM  [x]  allow foot to go under wheelchair base  [x]  enable transfers     []   Shoe holders  []  position foot    []  decrease / manage spasticity  []  control position of LE  []  stability    []  safety     []  Ankle strap/heel      loops  []  support foot on foot support  []  decrease extraneous movement  []  provide input to heel   []  protect foot     []  Amputee adapter []  R  []  L     Style:                  Size:  []  Provide support for stump/residual extremity    []  Transportation tie-down  []  to provide crash tested tie-down brackets    []  Crutch/cane holder    []  O2 holder    []  IV hanger   []  Ventilator tray/mount    []  stabilize accessory on wheelchair       Component  Justification     [x]  Seat cushion - Solution skin protection and solution     [x]  accommodate impaired sensation  []  decubitus ulcers present or history  [x]  unable to shift weight  [x]  increase pressure distribution  []  prevent pelvic extension  []  custom required "off-the-shelf"    seat cushion will not accommodate deformity  [x]  stabilize/promote pelvis alignment  []  stabilize/promote femur alignment  [x]  accommodate obliquity  []  accommodate multiple deformity  [x]  incontinent/accidents  []  low maintenance     []  seat mounts                 []  fixed []  removable  []  attach seat platform/cushion to wheelchair frame    []  Seat wedge    []  provide increased aggressiveness of seat shape to decrease sliding  down in the seat  []  accommodate ROM        []  Cover replacement   []  protect back or seat cushion  []  incontinent/accidents    []  Solid seat / insert    []  support cushion to prevent      hammocking  []  allows attachment of cushion to mobility base    []  Lateral pelvic/thigh/hip     support (Guides)     []  decrease  abduction  []  accommodate pelvis  []  position upper legs  []  accommodate spasticity  []  removable for transfers     []  Lateral pelvic/thigh      supports mounts  []  fixed   []  swing-away   []  removable  []  mounts lateral pelvic/thigh supports     []  mounts lateral pelvic/thigh supports swing-away or removable for transfers    []  Medial thigh support ([place]mel)  [] decrease adduction  [] accommodate ROM  []  remove for transfers   []  alignment      []  Medial thigh   []  fixed      support mounts      []  swing-away   []  removable  []  mounts medial thigh supports   []  Mounts medial supports swing- away or removable for transfers       Component  Justification   [x]  Back  - Sport Back      [x]  provide posterior trunk support []  facilitate tone  [x]  provide lumbar/sacral support []  accommodate deformity  [x]  support trunk in midline   []  custom required "off-the-shelf" back support will not accommodate deformity   []  provide lateral trunk support []  accommodate or decrease tone            []  Back mounts  []  fixed  []   removable  []  attach back rest/cushion to wheelchair frame   []  Lateral trunk      supports  []  R []  L  []  decrease lateral trunk leaning  []  accommodate asymmetry    []  contour for increased contact  []  safety    []  control of tone    []  Lateral trunk      supports mounts  []  fixed  []  swing-away   []  removable  []  mounts lateral trunk supports     []  Mounts lateral trunk supports swing-away or removable for transfers   []  Anterior chest      strap, vest     []  decrease forward movement of shoulder  []  decrease forward movement of trunk  []  safety/stability  []  added abdominal support  []  trunk alignment  []  assistance with shoulder control   []  decrease shoulder elevation    [x]  Headrest      [x]  provide posterior head support  [x]  provide posterior neck support  []  provide lateral head support  []  provide anterior head support  [x]  support during tilt and recline   []  improve feeding     []  improve respiration  []  placement of switches  [x]  safety    []  accommodate ROM   []  accommodate tone  []  improve visual orientation   [x]  Headrest           []  fixed [x]  removable []  flip down      Mounting hardware   []  swing-away laterals/switches  [x]  mount headrest   []  mounts headrest flip down or  removable for transfers  []  mount headrest swing-away laterals   []  mount switches     []  Neck Support    []  decrease neck rotation  []  decrease forward neck flexion   Pelvic Positioner    [x]  std hip belt          []  padded hip belt  []  dual pull hip belt  []  four point hip belt  [x]  stabilize tone  [x]  decrease falling out of chair  [x]  prevent excessive extension  []  special pull angle to control      rotation  []  pad for protection over boney   prominence  []  promote comfort    []  Essential needs        bag/pouch   []  medicines []  special food rorthotics []  clothing changes  []  diapers  []  catheter/hygiene []  ostomy supplies   The above equipment has a life- long use expectancy.  Growth and changes in medical and/or functional conditions would be the exceptions.   SUMMARY:  Why mobility device was selected; include why a lower level device is not appropriate:   ASSESSMENT:  CLINICAL IMPRESSION: Patient is a 62 y.o. male with PMH of multiple sclerosis with secondary documented quadriparesis who was seen today for a physical therapy evaluation for a wheelchair. Patient has a power wheelchair at baseline; however, given age and wear of seating system, the device in unable to safely meet patient's needs at this time, leading to high risk of skin breakdown and numerous falls/near fall. Based on today's evaluation, the Quantum Edge 3 power wheelchair is a medical necessity in order to meet patient's current mobility needs. All other mobility devices such as cane, crutch, scooter, walker, and manual wheelchair are inadequate to safely accommodate patient's  level of tone, skin integrity, and transfer needs while allowing appropriate level of independence. Furthermore, given patient's incontinence recommend the solution skin protection/solution seat cushion as it  provides removable cover to allow for cleaning and will allow for appropriate pressure relief and support for transfers. A removable headrest is also necessary to provide appropriate head support. Given the significance of extensor tone, a standard hip belt is needed to prevent falls. The sport back will help accommodate patient's positioning needs. Arm rests full length/flip back, flip up angle adjustable/depth adjustable foot plate, swing away joystick will all accommodate patient's positioning needs in addition to improve ease of transfers. The power tilt is requirement to accommodate extensors tone and provide pressure relief as patient is unable to otherwise safely do so. Patient will also benefit from power elevating leg rests to accommodate variations in tone and improve ease of transfers. In short, the Quantum Edge 3 is a medical necessity in order to meet patient's current mobility needs and maximize independence.    OBJECTIVE IMPAIRMENTS decreased activity tolerance, decreased balance, decreased endurance, decreased mobility, difficulty walking, decreased ROM, decreased strength, increased edema, impaired sensation, and pain.   ACTIVITY LIMITATIONS carrying, standing, squatting, transfers, continence, bathing, toileting, and dressing  PARTICIPATION LIMITATIONS: cleaning, shopping, community activity, and yard work  PERSONAL FACTORS Social background, Time since onset of injury/illness/exacerbation, and 1 comorbidity: see above  are also affecting patient's functional outcome.   CLINICAL DECISION MAKING: Unstable/unpredictable  EVALUATION COMPLEXITY: High                                   GOALS: One time visit. No goals established.  [facility] PT FREQUENCY: one time  visit  Carmelia Bake, PT, DPT 09/26/2022, 12:45 PM    I concur with the above findings and recommendations of the therapist:  Physician name printed:         Physician's signature:      Date:    09/21/2022 12:30 PM

## 2022-09-22 ENCOUNTER — Encounter: Payer: Self-pay | Admitting: Physical Therapy

## 2022-09-26 NOTE — Addendum Note (Signed)
Addended by: Maryruth Eve A on: 09/26/2022 12:53 PM   Modules accepted: Orders

## 2022-10-09 ENCOUNTER — Telehealth: Payer: Self-pay | Admitting: *Deleted

## 2022-10-09 NOTE — Telephone Encounter (Signed)
Sent message to Zenia Resides at [employer] for her to fax p/w  to [place] at [phone] for pts appt 10-10-2022 at 0900.

## 2022-10-09 NOTE — Telephone Encounter (Signed)
LMVM for pt to call back regarding HELD appt wth MM/NP tomorrow about his power WC.  10-10-2022 at 0900.  He is to call back ASAP.

## 2022-10-09 NOTE — Telephone Encounter (Signed)
Need face to face appointment scheduled Received: 1 week ago Davene Costain, RN [facility],  Patient had his PT Mobility/Seating Evaluation completed on 09/21/22 and now needs an appointment scheduled for Power Wheelchair.  Can you please let me know when patient can be scheduled, and I will fax over paperwork for the appointment.  Thank you and please let me know if you have any questions.     Previous Messages    ----- Message ----- From: Gerlene Burdock Sent: 08/02/2022  11:42 AM EDT To: [name], RN  Yes ma'am. Thank you We are waiting for a date for to schedule his PT eval for wheelchair.  ----- Message ----- From: [name], RN Sent: 07/31/2022  10:53 AM EDT To: Gerlene Burdock  Did you get what was needed for this pt?  Andrey Campanile RN ----- Message ----- From: Gerlene Burdock Sent: 07/10/2022   7:23 PM EDT To: [name], RN  Perfect thank you! Let me know please when you fax it back and I will be on the look out for it!  ----- Message ----- From: [name], RN Sent: 07/10/2022   5:17 PM EDT To: Gerlene Burdock  Received, placed in MM/NP in box to sign off.  Andrey Campanile RN   ----- Message ----- From: Gerlene Burdock Sent: 07/10/2022   4:46 PM EDT To: [name], RN  Brendia Sacks,  After further reviewing this will you ask Aundra Millet to sign the order for the PT Mobility/Seating Evaluation and I will get patient scheduled for the eval and then once that is completed, we will request a F2F appointment at that time.  I am faxing the order over now for signature. Sorry for any confusion and feel free to call me if you have any questions.  Thank you!  ----- Message ----- From: Gerlene Burdock Sent: 07/10/2022   4:37 PM EDT To: [name], RN  Hello -  I got your VM and meant to call you back but the day got away from me!  It looks like patient will need a new appointment scheduled for the power wheelchair since the 06/26/22 appointment  didn't address patient was being seen for the power wheelchair.  I will refax the paperwork to you at the fax you left on my VM requesting new appointment. Please let me know if you have any questions!  Thank you! Zenia Resides [facility] PH: [phone]

## 2022-10-10 ENCOUNTER — Ambulatory Visit (INDEPENDENT_AMBULATORY_CARE_PROVIDER_SITE_OTHER): Payer: 59 | Admitting: Adult Health

## 2022-10-10 ENCOUNTER — Encounter: Payer: Self-pay | Admitting: Adult Health

## 2022-10-10 VITALS — BP 137/85 | HR 81 | Ht 75.0 in

## 2022-10-10 DIAGNOSIS — G35 Multiple sclerosis: Secondary | ICD-10-CM

## 2022-10-10 DIAGNOSIS — R269 Unspecified abnormalities of gait and mobility: Secondary | ICD-10-CM | POA: Diagnosis not present

## 2022-10-10 NOTE — Progress Notes (Addendum)
Order and last [id number] note faxed to Zenia Resides at [employer] for power wheelchair for pt. 26pgs.  Confirmed by fax [phone] received.  [phone].

## 2022-10-10 NOTE — Progress Notes (Signed)
PATIENT: Randy Shaw DOB: Dec 16, 1960  REASON FOR VISIT: follow up HISTORY FROM: patient  Chief Complaint  Patient presents with   Follow-up    Pt in 19  Pt here for Wheelchair evaluation Pt states double vision,speech slurred. Pt states doesn't feel sharp      HISTORY OF PRESENT ILLNESS: Today 10/10/22:  Randy Shaw is a 62 y.o. male with a history of multiple sclerosis. Returns today for follow-up.  Patient is being seen today for mobility assessment for power wheelchair.  I have reviewed the patient's physical therapy evaluation and agree with their assessment and plan.  Patient reports that he has a scratch/sore on his lower back on the right side.  Patient is currently using a motorized wheelchair.  He is unable to ambulate independently.  The motorized wheelchair will help him ambulate within his home to get to the bathroom, kitchen to get his meals and into his bedroom.  Patient has good strenght in the upper extremities. Slightly limited with ROM in in the LUE.  Reports that he has trouble with transfers. His son has to help. Has had falls in the past. He is currently able to use a motorized wheelchair.  I do not perceive that he would have any issues operating the motorized wheelchair due to physical or mental limitations.  He remains on Ocrevus.  Not sure that he can tell a difference since he started this.  We did repeat imaging after the last visit and scans were stable.     06/27/22: Randy Shaw is a 62 y.o. male with a history of Multiple Sclerosis. Returns today for follow-up. Currently on Ocrevus. Can't really tell a difference since he started. Feels that he is overall stable but disease is progressing slowly. No changes with bowels. Has a foley catheter for the last month d/t urinary rentention. Continues to have weakness on the left side that has progressively gotten worse. Lives with his Son. Son helps with ADLS. Uses a motorized wheelchair.  Continues on  Keppra- no seizure events.    December 22, 2021 He received tocolytics and IV infusion in May 2023, tolerating it well, no significant change in his daily function, still able to manage transfer himself in and out of wheelchair, cooking, came into office by transportation   Laboratory evaluation showed normal negative TB, RPR, B12, vitamin D level was 14, A1c 5.7, negative hepatitis B, C,  UPDATE Jun 23 2021: We personally reviewed MRI of the brain with without contrast in December 2023, no contrast-enhancement, supratentorium stable MS lesions  Over past few months, He developed new onset bilateral feet paresthesia, Plantar surface needle prick pain, increased lower extremity spasticity, gait abnormality, increased difficulty transferring himself, yesterday, getting up from the toilet, fell,  He also has urinary urgency, taking Flomax 0.4 mg twice a day, is under the care of urologist.   Reviewed extensive laboratory evaluations, which showed normal negative B12, RPR, hepatitis panel, varicella-zoster antibody, CD19, 20, IgG level,   Vitamin D deficiency level was 15.6, mild elevated A1c 5.7  12/22/21: ALECSANDER Shaw, is a 62 year old male, previous patient of Dr. Anne Hahn for relapsing remitting multiple sclerosis, return for visit, also with new onset seizure on March 03, 2021   I reviewed and summarized the referring note.PMHX Hypertension Relapsing remitting multiple sclerosis   Patient was diagnosed with remitting multiple sclerosis in July 1995, he presented with right arm numbness, later developed numbness in his left arm, progressive gait difficulty since 2000,.  He also has double vision. He was treated with Avonex in 1995 for one year, was switched to betaseron from 1996 till 2015, when he started to take Tecfidra.  Last flare up was around 2005.   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  The patient uses a wheelchair primarily since 2005. The  patient is able to ambulate using a walker but primarily uses the scooter.    Personally reviewed MRI of the brain with and without contrast in May 2021, multiple supra and infratentorial chronic demyelinating plaque, no change compared to March 2019 MRI of cervical spine with without contrast, multiple chronic demyelinating plaque from C2-C7, no change MRI of thoracic spine with without contrast August 16, 2020, scattered foci of abnormal T2 signal within the thoracic spinal cord, no cord compression,    Despite no imaging changes, patient continues complain some worsening symptoms since 2021, has worsening double vision, could no longer move left eye past midline, in addition, he noticed increased weakness of left arm and leg, could no longer lifting up left foot,  Also noticed increased difficulty urinate, required Foley catheter now,   New onset seizure March 03, 2021, that was witnessed by his son, now is  treated with Keppra 500 mg twice a day  He lives with his son, but still quite independent in his daily activity, can transfer himself,  /02/14/21: Randy Shaw  is a 62 y.o. male with a history of MS. He is here today for a follow up. He remains on the Tecfidera 240 mg twice daily and Baclofen 1 tab in the morning, 1 tab at noon, and 2 tabs at bedtime. He does not take the tizanidine at this time. He recently experienced some urinary retention in which he required a 2 week dose of prednisone. He endorses that his retention issues have resolved and overall his MS is doing well. He reports that he is mostly wheelchair bound and rarely walks. He is currently living with his son. Patient states he is having some LUE stiffness and pain with movement. He has not tried any OTC medications for this and was encouraged to do so. If pain and stiffness persist, encouraged patient to speak with his PCP for further workup.      HISTORY  Copied from Dr. Anne Hahn note 08/10/2020 Randy Shaw is a 62 year old  right-handed black male with a history of multiple sclerosis.  The patient has a significant problem with weakness in the legs, left greater than right.  He has fallen on occasion when trying to transfer from the bed to the wheelchair.  He has noted over the last several weeks that he has had some problems with voiding the bladder, he has been to the emergency room on 2 occasions on 01 July 2020, and then again on 18 July 2020.  The patient had a Foley catheter placed for about a week each time, but he has not been able to void the bladder once the catheter was removed.  He is followed through urology, he is seen by Dr. Annabell Howells.  The patient denies any low back pain or pain down the legs on either side.  He does have some altered sensation of the bladder and bowel, he has some urgency with the bowels, this is chronic for him.  There is some question of benign prostate enlargement.  He has not noted any new numbness or weakness of the arms or new vision changes, he does have chronic double vision  when looking to the right.  He last had MRI of the brain and cervical spinal cord about a year ago.  He remains on Tecfidera.  REVIEW OF SYSTEMS: Out of a complete 14 system review of symptoms, the patient complains only of the following symptoms, and all other reviewed systems are negative.  ALLERGIES: Allergies  Allergen Reactions   Peanut-Containing Drug Products Shortness Of Breath   Zanaflex [Tizanidine Hcl] Shortness Of Breath   Interferon Beta-1a Other (See Comments)    HOME MEDICATIONS: Outpatient Medications Prior to Visit  Medication Sig Dispense Refill   baclofen (LIORESAL) 20 MG tablet TAKE 1 TABLET BY MOUTH EVERY MORNING, TAKE 1 TABLET AT NOON AND TAKE 2 TABLETS AT NIGHT 360 tablet 3   Cholecalciferol (VITAMIN D-3) 125 MCG (5000 UT) TABS Take 5,000 Units by mouth daily.     DULoxetine (CYMBALTA) 60 MG capsule TAKE 1 CAPSULE(60 MG) BY MOUTH DAILY 30 capsule 11   levETIRAcetam (KEPPRA) 500 MG  tablet TAKE 1 TABLET(500 MG) BY MOUTH TWICE DAILY 60 tablet 6   losartan-hydrochlorothiazide (HYZAAR) 50-12.5 MG tablet Take 1 tablet by mouth daily.     tamsulosin (FLOMAX) 0.4 MG CAPS capsule Take 0.4 mg by mouth 2 (two) times daily.     No facility-administered medications prior to visit.    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    PAST SURGICAL HISTORY: Past Surgical History:  Procedure Laterality Date   COLONOSCOPY WITH PROPOFOL N/A 03/14/2022   Procedure: COLONOSCOPY WITH PROPOFOL;  Surgeon: Kathi Der, MD;  Location: [facility];  Service: Gastroenterology;  Laterality: N/A;   none      FAMILY HISTORY: Family History  Problem Relation Age of Onset   Cancer Mother        breast   Stroke Father    Cancer - Colon Father    Multiple sclerosis Neg Hx     SOCIAL HISTORY: Social History   Socioeconomic History   Marital status: Divorced    Spouse name: n/a   Number of children: 3   Years of education: 12   Highest education level: Not on file  Occupational History   Occupation: disabled/retired 1999-[place]    Comment: Event organiser  Tobacco Use   Smoking status: Never   Smokeless tobacco: Never  Substance and Sexual Activity   Alcohol use: Not Currently    Alcohol/week: 0.0 standard drinks of alcohol    Comment: occasionally wine cooler   Drug use: No   Sexual activity: Never  Other Topics Concern   Not on file  Social History Narrative   Lives alone, house.     Right Handed   Drinks 2-3 cups caffeine   Social Determinants of Health   Financial Resource Strain: Not on file  Food Insecurity: Not on file  Transportation Needs: Not on file  Physical Activity: Not on file  Stress: Not on file  Social Connections: Not on file  Intimate  Partner Violence: Not on file      PHYSICAL EXAM  Vitals:   10/10/22 0900  BP: 137/85  Pulse: 81  Height: 6\' 3"  (1.905 m)     Body mass index is 25 kg/m.  Generalized: Well developed, in no acute distress   Neurological examination  Mentation: Alert oriented to time, place,  history taking. Follows all commands speech and language fluent Cranial nerve II-XII: Pupils were equal round reactive to light.  Left INO. facial sensation and strength were normal. Uvula tongue midline. Head turning and shoulder shrug  were normal and symmetric. Motor: The motor testing reveals 5/5 strength in RUE 4/5 RLE and 4/5 LUE 3/5 LLE. Marland Kitchen Foot drop noted to the LLE. Sensory: Sensory testing is intact to soft touch on all 4 extremities. No evidence of extinction is noted.  Coordination: Cerebellar testing reveals ataxia bilaterally in the finger-nose-finger.  Gait and station: Not ambulated, patient is wheelchair bound.    DIAGNOSTIC DATA (LABS, IMAGING, TESTING) - I reviewed patient records, labs, notes, testing and imaging myself where available.  Lab Results  Component Value Date   WBC 4.7 06/27/2022   HGB 12.8 (L) 06/27/2022   HCT 40.3 06/27/2022   MCV 85 06/27/2022   PLT 233 06/27/2022      Component Value Date/Time   NA 146 (H) 06/27/2022 1118   K 3.7 06/27/2022 1118   CL 105 06/27/2022 1118   CO2 25 06/27/2022 1118   GLUCOSE 115 (H) 06/27/2022 1118   GLUCOSE 123 (H) 03/14/2022 1009   BUN 14 06/27/2022 1118   CREATININE 1.12 06/27/2022 1118   CREATININE 0.89 07/30/2013 0952   CALCIUM 9.8 06/27/2022 1118   PROT 6.5 06/27/2022 1118   ALBUMIN 4.2 06/27/2022 1118   AST 16 06/27/2022 1118   ALT 15 06/27/2022 1118   ALKPHOS 84 06/27/2022 1118   BILITOT 0.2 06/27/2022 1118   GFRNONAA >60 03/04/2021 0540   GFRNONAA >89 07/30/2013 0952   GFRAA 107 02/05/2020 1144   GFRAA >89 07/30/2013 0952   Lab Results  Component Value Date   CHOL 233 (H) 01/01/2019   HDL 67 01/01/2019    LDLCALC 149 (H) 01/01/2019   TRIG 98 01/01/2019   CHOLHDL 3.5 01/01/2019   Lab Results  Component Value Date   HGBA1C 5.7 (H) 03/10/2021   Lab Results  Component Value Date   VITAMINB12 527 03/10/2021   Lab Results  Component Value Date   TSH 2.280 12/22/2021      ASSESSMENT AND PLAN 62 y.o. year old male  has a past medical history of Abnormality of gait (04/22/2013), Asthma, BPH (benign prostatic hyperplasia), Hypertension, Multiple sclerosis (HCC), and Quadriplegia and quadriparesis (HCC) (03/08/2016). here with .    1. Multiple sclerosis-secondary progressive 2.  Seizures 3.  Abnormal gait and balance  - Continue Ocrevus infusions -Continue Baclofen 20 mg 4 times a day -Continue Keppra 500 mg twice a day -Order sent for motorized wheelchair  -Keep follow-up September all   Butch Penny, MSN, NP-C 10/10/2022, 8:59 AM [facility] [address] [place], [place] [address] [phone]

## 2022-10-17 IMAGING — MR MR HEAD WO/W CM
16 series · 48 of 48 positions shown · IV contrast (multihance)
Comparison: 3838 images only. Dictation by non-radiologist is not
reviewed.

CLINICAL DATA: Multiple sclerosis, monitor

EXAM:
MRI HEAD WITHOUT AND WITH CONTRAST
TECHNIQUE: Multiplanar, multiecho pulse sequences of the brain and surrounding
structures were obtained without and with intravenous contrast.
CONTRAST:  19mL MULTIHANCE GADOBENATE DIMEGLUMINE 529 MG/ML IV SOLN

[Series 6: T1 · sagittal · 4.0mm · 0.75mm/px · 2 of 31 slices shown (1 of 3)]
[im 1/31]
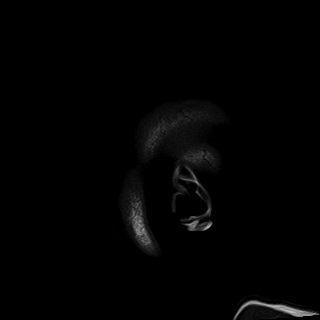
[im 31/31]
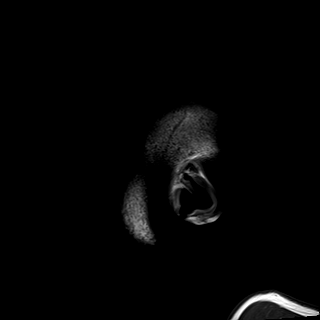

[Series 7: DWI · axial · 3.0mm · 0.94mm/px · z∈[-60,+87]mm · 9 of 176 slices shown]
[im 1/176]
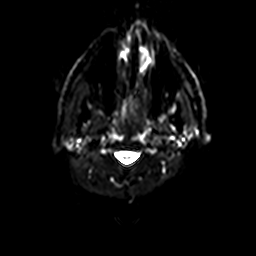
[im 22/176]
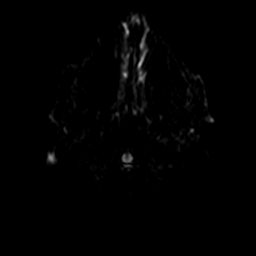
[im 44/176]
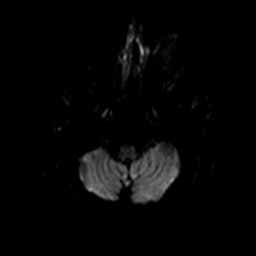
[im 66/176]
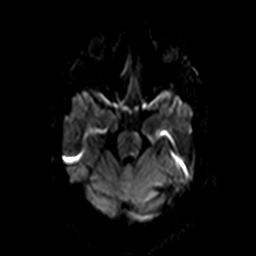
[im 88/176]
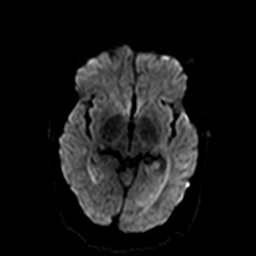
[im 110/176]
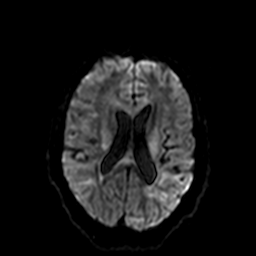
[im 132/176]
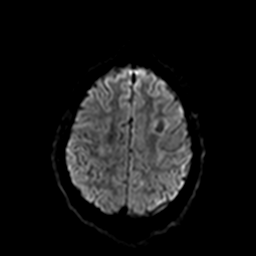
[im 154/176]
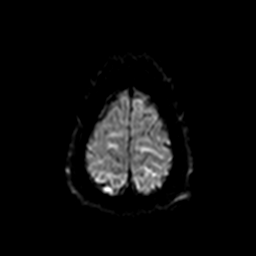
[im 176/176]
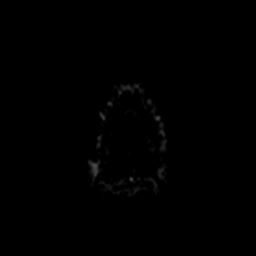

[Series 8: ax dwi_tracew · axial · 3.0mm · 0.94mm/px · z∈[-60,+87]mm · 5 of 88 slices shown]
[im 1/88]
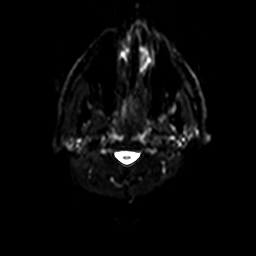
[im 22/88]
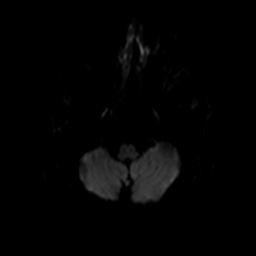
[im 44/88]
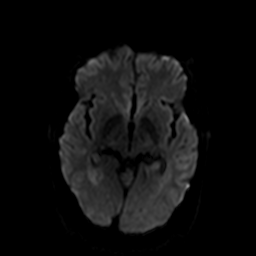
[im 66/88]
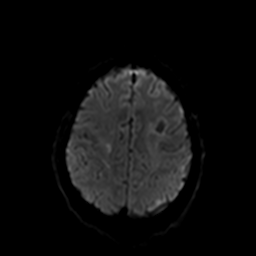
[im 88/88]
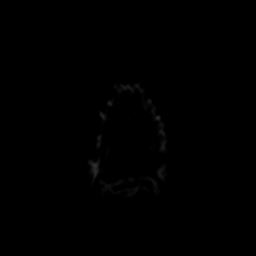

[Series 9: ax dwi_adc · axial · 3.0mm · 0.94mm/px · z∈[-60,+87]mm · 2 of 44 slices shown]
[im 1/44]
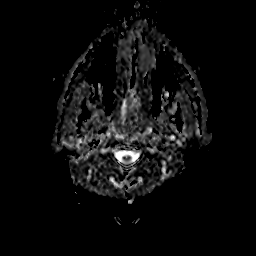
[im 44/44]
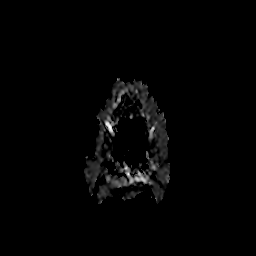

[Series 10: T2 · axial · 4.0mm · 0.36mm/px · 1 of 30 slices shown (1 of 2)]
[im 1/30]
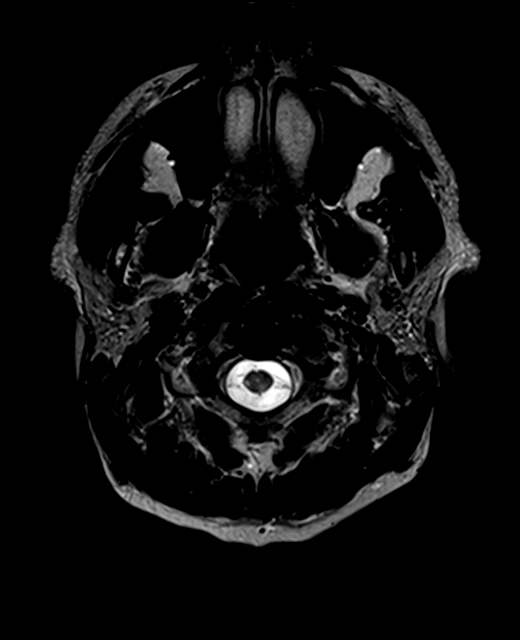

[Series 11: FLAIR · axial · 3.0mm · 0.72mm/px · 1 of 26 slices shown (1 of 3)]
[im 1/26]
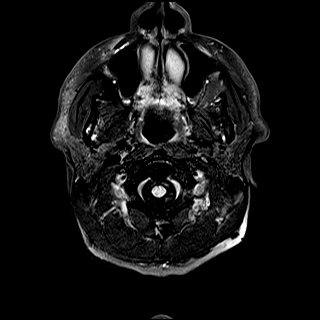

[Series 12: FLAIR · sagittal · 4.0mm · 0.72mm/px · 1 of 27 slices shown (2 of 3)]
[im 1/27]
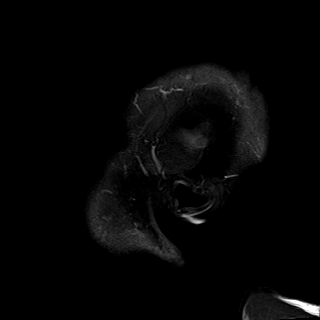

[Series 13: swi_images · axial · 1.5mm · 0.90mm/px · z∈[-63,+74]mm · 4 of 96 slices shown]
[im 1/96]
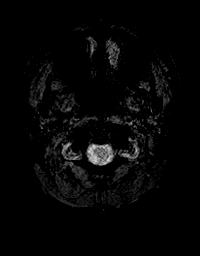
[im 32/96]
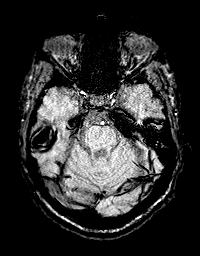
[im 64/96]
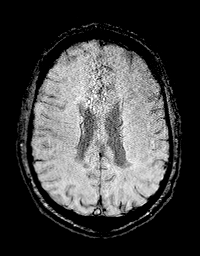
[im 96/96]
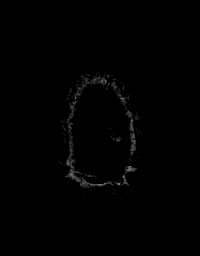

[Series 14: mip_images(sw) · axial · 12.0mm · 0.90mm/px · z∈[-58,+69]mm · 4 of 89 slices shown]
[im 1/89]
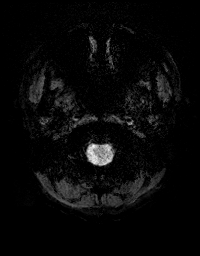
[im 30/89]
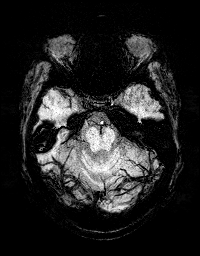
[im 59/89]
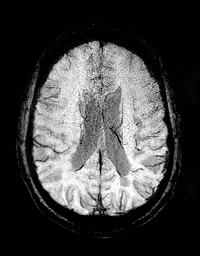
[im 89/89]
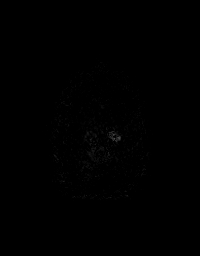

[Series 15: T1 · axial · 1.0mm · 0.94mm/px · z∈[-77,+79]mm · 7 of 160 slices shown (2 of 3)]
[im 1/160]
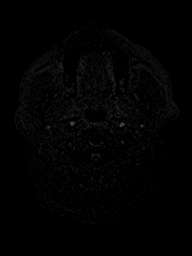
[im 27/160]
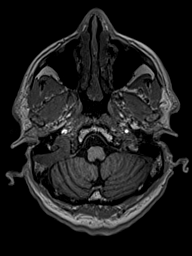
[im 54/160]
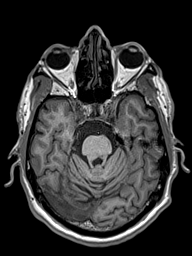
[im 80/160]
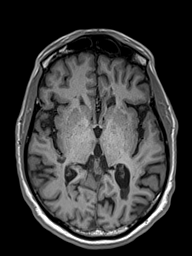
[im 107/160]
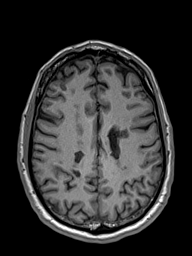
[im 133/160]
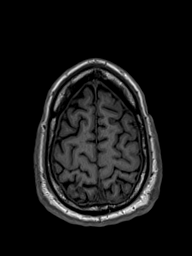
[im 160/160]
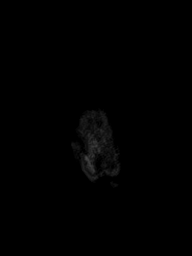

[Series 16: T2 · coronal · 3.0mm · 0.47mm/px · 1 of 25 slices shown (2 of 2)]
[im 1/25]
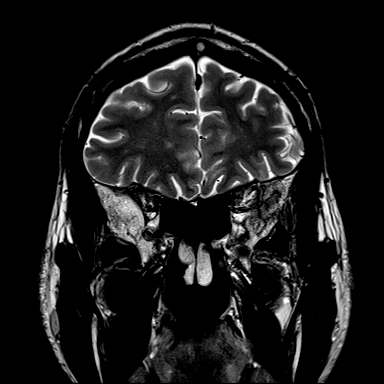

[Series 17: FLAIR · coronal · 3.0mm · 0.56mm/px · 1 of 25 slices shown (3 of 3)]
[im 1/25]
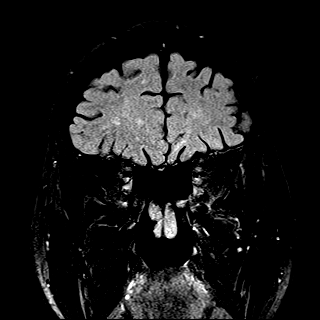

[Series 18: T2 post-contrast · coronal · 4.5mm · 0.36mm/px · 1 of 31 slices shown]
[im 1/31]
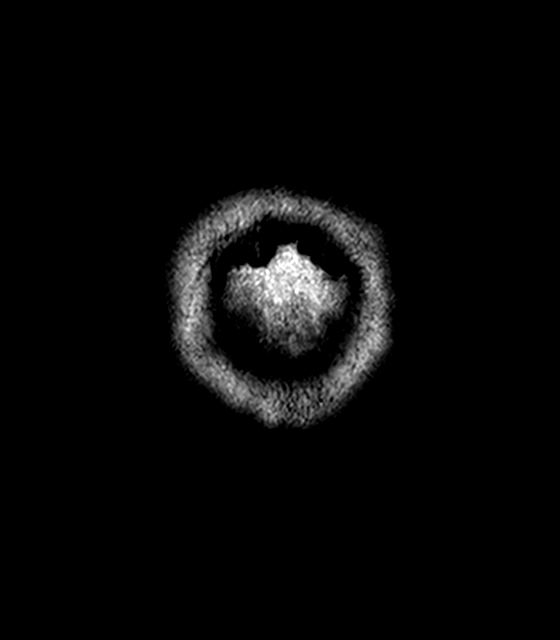

[Series 19: T1 · axial · 1.0mm · 0.94mm/px · z∈[-78,+78]mm · 7 of 160 slices shown (3 of 3)]
[im 1/160]
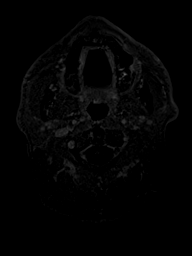
[im 27/160]
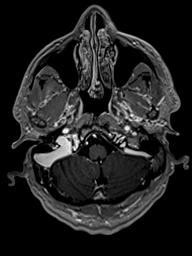
[im 54/160]
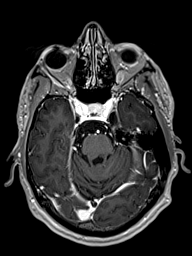
[im 80/160]
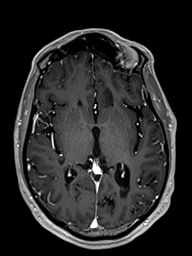
[im 107/160]
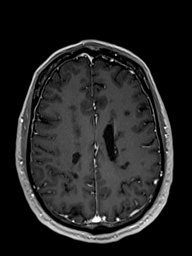
[im 133/160]
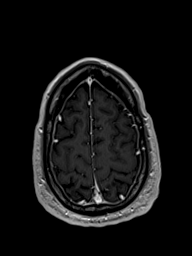
[im 160/160]
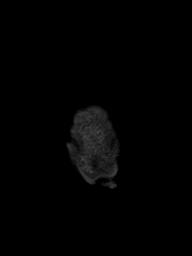

[Series 20: T1 post-contrast · coronal · 4.5mm · 0.72mm/px · 1 of 31 slices shown (1 of 2)]
[im 1/31]
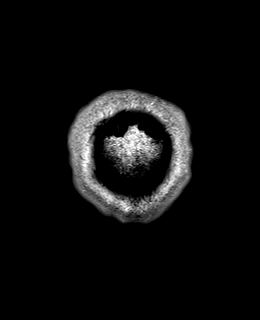

[Series 21: T1 post-contrast · sagittal · 4.0mm · 0.75mm/px · 1 of 31 slices shown (2 of 2)]
[im 1/31]
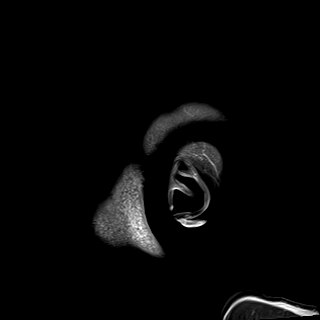

[48 of 48 positions shown; findings below may reference images not displayed]

FINDINGS: Brain: Foci of T2 hyperintensity are again identified in the
supratentorial periventricular and subcortical/juxtacortical white
matter. As before, there is also involvement of the midbrain in the
periaqueductal region and dorsal pons and adjacent left brachium
pontis. Several of the lesions demonstrate marked T1 hypointensity
consistent with "black holes." There is no abnormal enhancement.
Mild associated volume loss is unchanged. There is no definite new
lesion.

No acute infarction or hemorrhage. No intracranial mass or mass
effect. No extra-axial collection.

Vascular: Major vessel flow voids at the skull base are preserved.

Skull and upper cervical spine: Normal marrow signal is preserved.

Sinuses/Orbits: Minor mucosal thickening.  Orbits are unremarkable.

Other: Sella is unremarkable.  Mastoid air cells are clear.
IMPRESSION: Stable burden of chronic demyelinating disease. No definite new
lesion or abnormal enhancement.

## 2022-10-23 ENCOUNTER — Telehealth: Payer: Self-pay | Admitting: Neurology

## 2022-10-23 NOTE — Telephone Encounter (Signed)
Called and spoke to pt and asked about his transportation issues. He stated that he now has the [employer] bus for transportation and now as long as he knows a day in advance he will be able to secure it.

## 2022-10-23 NOTE — Telephone Encounter (Signed)
Complains of transportation issues, please call patient and help him getting help, such as transit

## 2022-10-24 NOTE — Telephone Encounter (Signed)
Randy Shaw made aware in the infusion area an dvoiced gratitude and understanding for [place] trying to help but that the pt knows plenty of time in advance and he just doesn't request rides until the last min

## 2022-11-06 ENCOUNTER — Ambulatory Visit (HOSPITAL_BASED_OUTPATIENT_CLINIC_OR_DEPARTMENT_OTHER)
Admission: RE | Admit: 2022-11-06 | Discharge: 2022-11-06 | Disposition: A | Discharge status: Home / Self Care | Payer: 59 | Source: Ambulatory Visit | Attending: Vascular Surgery | Admitting: Vascular Surgery

## 2022-11-06 ENCOUNTER — Other Ambulatory Visit (HOSPITAL_COMMUNITY): Payer: Self-pay | Admitting: Internal Medicine

## 2022-11-06 ENCOUNTER — Other Ambulatory Visit (HOSPITAL_COMMUNITY): Payer: Self-pay

## 2022-11-06 ENCOUNTER — Ambulatory Visit (HOSPITAL_COMMUNITY)
Admission: RE | Admit: 2022-11-06 | Discharge: 2022-11-06 | Disposition: A | Discharge status: Home / Self Care | Payer: 59 | Source: Ambulatory Visit | Attending: Internal Medicine | Admitting: Internal Medicine

## 2022-11-06 ENCOUNTER — Encounter (HOSPITAL_COMMUNITY): Payer: Self-pay

## 2022-11-06 VITALS — BP 162/95 | HR 65

## 2022-11-06 DIAGNOSIS — R600 Localized edema: Secondary | ICD-10-CM | POA: Diagnosis present

## 2022-11-06 DIAGNOSIS — I82412 Acute embolism and thrombosis of left femoral vein: Secondary | ICD-10-CM | POA: Insufficient documentation

## 2022-11-06 MED ORDER — APIXABAN (ELIQUIS) VTE STARTER PACK (10MG AND 5MG)
ORAL_TABLET | ORAL | 0 refills | Status: DC
  Filled 2022-11-06: qty 74, 30d supply, fill #0

## 2022-11-06 MED ORDER — APIXABAN 5 MG PO TABS: 5.0000 mg | ORAL_TABLET | Freq: Two times a day (BID) | ORAL | 1 refills | Status: DC

## 2022-11-06 NOTE — Progress Notes (Signed)
LLE venous duplex has been completed.  Preliminary results messaged to Dr. Nehemiah Settle.  Per instructions on order, patient sent to [facility] for further treatment.    Results can be found under chart review under CV PROC. 11/06/2022 11:20 AM Chloe Flis RVT, RDMS

## 2022-11-06 NOTE — Patient Instructions (Signed)
-  Start apixaban (Eliquis) 10 mg twice daily for 7 days followed by 5 mg twice daily. -Your refills have been sent to your [facility] at [employer]. You may need to call the pharmacy to ask them to fill this when you start to run low on your current supply.  -It is important to take your medication around the same time every day.  -Avoid NSAIDs like ibuprofen (Advil, Motrin) and naproxen (Aleve) as well as aspirin doses over 100 mg daily. -Tylenol (acetaminophen) is the preferred over the counter pain medication to lower the risk of bleeding. -Be sure to alert all of your health care providers that you are taking an anticoagulant prior to starting a new medication or having a procedure. -Monitor for signs and symptoms of bleeding (abnormal bruising, prolonged bleeding, nose bleeds, bleeding from gums, discolored urine, black tarry stools). If you have fallen and hit your head OR if your bleeding is severe or not stopping, seek emergency care.  -Go to the emergency room if emergent signs and symptoms of new clot occur (new or worse swelling and pain in an arm or leg, shortness of breath, chest pain, fast or irregular heartbeats, lightheadedness, dizziness, fainting, coughing up blood) or if you experience a significant color change (pale or blue) in the extremity that has the DVT.  -We recommend you wear compression stockings (15-20 or 20-30 mmHg) as long as you are having swelling or pain. Be sure to purchase the correct size and take them off at night.   Your next visit is on Tuesday, September 10th at 2:30pm.  [facility] [address] [place], [place], [place] [address] Enter the hospital through Entrance C off [facility] and pull up to the [facility] entrance to the free valet parking.  Check in for your appointment at the [facility].   If you have any questions or need to reschedule an appointment, please call [phone] [facility].  If you are  having an emergency, call 911 or present to the nearest emergency room.   What is a DVT?  -Deep vein thrombosis (DVT) is a condition in which a blood clot forms in a vein of the deep venous system which can occur in the lower leg, thigh, pelvis, arm, or neck. This condition is serious and can be life-threatening if the clot travels to the arteries of the lungs and causing a blockage (pulmonary embolism, PE). A DVT can also damage veins in the leg, which can lead to long-term venous disease, leg pain, swelling, discoloration, and ulcers or sores (post-thrombotic syndrome).  -Treatment may include taking an anticoagulant medication to prevent more clots from forming and the current clot from growing, wearing compression stockings, and/or surgical procedures to remove or dissolve the clot.

## 2022-11-06 NOTE — Progress Notes (Cosign Needed)
DVT Clinic Note  Name: Randy Shaw     MRN: [id number]     DOB: 09-Nov-1960     Sex: male  PCP: Renford Dills, MD  Today's Visit: Visit Information: Initial Visit  Referred to DVT Clinic by: Dr. Nehemiah Settle (PCP)  Referred to CPP by: Dr. Karin Lieu Reason for referral:  Chief Complaint  Patient presents with   DVT   HISTORY OF PRESENT ILLNESS: Randy Shaw is a 62 y.o. 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 He presents today using a motorized wheelchair. He is not able to ambulate independently. He reports his left lower leg started swelling a few months ago. He thought it was just related to eating salty foods. The swelling continued to worsen. He was seen by his PCP Dr. Nehemiah Settle today who ordered an ultrasound which showed age-indeterminate DVT extending from the femoral vein through the tibial veins. He has no personal or family history of DVT. LLE swelling is all below the knee. He has no pain. No SOB, chest pain, palpitations.   Positive Thrombotic Risk Factors: Paralysis, paresis, or recent plaster cast immobilization of lower extremity Bleeding Risk Factors: None Present  Negative Thrombotic Risk Factors: Previous VTE, Recent surgery (within 3 months), Recent trauma (within 3 months), Recent admission to hospital with acute illness (within 3 months), Central venous catheterization, Pregnancy, Testosterone therapy, Estrogen therapy, Sedentary journey lasting >8 hours within 4 weeks, Recent cesarean section (within 3 months), Recent COVID diagnosis (within 3 months), Known thrombophilic condition, Non-malignant, chronic inflammatory condition, Erythropoiesis-stimulating agent, Active cancer, Smoking, Obesity, Older age, Bed rest >72 hours within 3 months, Within 6 weeks postpartum  Rx Insurance Coverage: Medicaid Rx Affordability: Starter pack filled today for $0. Eliquis  refills will be $4 for 30 or 90 days.  Preferred Pharmacy: Filled starter pack today during the visit at [facility]. Refills have been sent to his preferred [facility].   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    Past Surgical History:  Procedure Laterality Date   COLONOSCOPY WITH PROPOFOL N/A 03/14/2022   Procedure: COLONOSCOPY WITH PROPOFOL;  Surgeon: Kathi Der, MD;  Location: [facility];  Service: Gastroenterology;  Laterality: N/A;   none      Social History   Socioeconomic History   Marital status: Divorced    Spouse name: n/a   Number of children: 3   Years of education: 12   Highest education level: Not on file  Occupational History   Occupation: disabled/retired 1999-[place]    Comment: Event organiser  Tobacco Use   Smoking status: Never   Smokeless tobacco: Never  Substance and Sexual Activity   Alcohol use: Not Currently    Alcohol/week: 0.0 standard drinks of alcohol    Comment: occasionally wine cooler   Drug use: No   Sexual activity: Never  Other Topics Concern   Not on file  Social History Narrative   Lives alone, house.     Right Handed   Drinks 2-3 cups caffeine   Social Determinants of Health   Financial Resource Strain: Not on file  Food Insecurity: Not on file  Transportation Needs: Not on file  Physical Activity: Not on file  Stress: Not on  file  Social Connections: Not on file  Intimate Partner Violence: Not on file    Family History  Problem Relation Age of Onset   Cancer Mother        breast   Stroke Father    Cancer - Colon Father    Multiple sclerosis Neg Hx     Allergies as of 11/06/2022 - Review Complete 11/06/2022  Allergen Reaction Noted   Peanut-containing drug products Shortness Of Breath 04/22/2013   Zanaflex [tizanidine  hcl] Shortness Of Breath 12/07/2011   Interferon beta-1a Other (See Comments) 03/10/2022    Current Outpatient Medications on File Prior to Encounter  Medication Sig Dispense Refill   baclofen (LIORESAL) 20 MG tablet TAKE 1 TABLET BY MOUTH EVERY MORNING, TAKE 1 TABLET AT NOON AND TAKE 2 TABLETS AT NIGHT 360 tablet 3   Cholecalciferol (VITAMIN D-3) 125 MCG (5000 UT) TABS Take 5,000 Units by mouth daily.     DULoxetine (CYMBALTA) 60 MG capsule TAKE 1 CAPSULE(60 MG) BY MOUTH DAILY 30 capsule 11   levETIRAcetam (KEPPRA) 500 MG tablet TAKE 1 TABLET(500 MG) BY MOUTH TWICE DAILY 60 tablet 6   losartan-hydrochlorothiazide (HYZAAR) 50-12.5 MG tablet Take 1 tablet by mouth daily.     Ocrelizumab (OCREVUS IV) Inject into the vein. Every 6 month IV infusion     tamsulosin (FLOMAX) 0.4 MG CAPS capsule Take 0.4 mg by mouth 2 (two) times daily.     No current facility-administered medications on file prior to encounter.   REVIEW OF SYSTEMS:  Review of Systems  Respiratory:  Negative for shortness of breath.   Cardiovascular:  Positive for leg swelling. Negative for chest pain and palpitations.  Musculoskeletal:  Negative for myalgias.  Neurological:  Positive for tingling. Negative for dizziness.   PHYSICAL EXAMINATION:  Vitals:   11/06/22 1118  BP: (!) 162/95  Pulse: 65  SpO2: 100%   Physical Exam Vitals reviewed.  Cardiovascular:     Rate and Rhythm: Normal rate.  Pulmonary:     Effort: Pulmonary effort is normal.  Musculoskeletal:        General: No tenderness.     Right lower leg: No edema.     Left lower leg: Edema (2+ pitting edema) present.  Skin:    Findings: No bruising or erythema.  Psychiatric:        Mood and Affect: Mood normal.        Behavior: Behavior normal.        Thought Content: Thought content normal.   Villalta Score for Post-Thrombotic Syndrome: Pain: Absent Cramps: Absent Heaviness: Mild Paresthesia: Mild Pruritus: Mild Pretibial Edema: Moderate Skin  Induration: Absent Hyperpigmentation: Absent Redness: Absent Venous Ectasia: Absent Pain on calf compression: Absent Villalta Preliminary Score: 5 Is venous ulcer present?: No If venous ulcer is present and score is <15, then 15 points total are assigned: Absent Villalta Total Score: 5  LABS:  CBC     Component Value Date/Time   WBC 4.7 06/27/2022 1118   WBC 8.5 03/04/2021 0540   RBC 4.76 06/27/2022 1118   RBC 4.10 (L) 03/04/2021 0540   HGB 12.8 (L) 06/27/2022 1118   HCT 40.3 06/27/2022 1118   PLT 233 06/27/2022 1118   MCV 85 06/27/2022 1118   MCH 26.9 06/27/2022 1118   MCH 28.0 03/04/2021 0540   MCHC 31.8 06/27/2022 1118   MCHC 32.7 03/04/2021 0540   RDW 12.1 06/27/2022 1118   LYMPHSABS 1.5 06/27/2022 1118   MONOABS 0.7 03/04/2021 0540   EOSABS  0.3 06/27/2022 1118   BASOSABS 0.1 06/27/2022 1118    Hepatic Function      Component Value Date/Time   PROT 6.5 06/27/2022 1118   ALBUMIN 4.2 06/27/2022 1118   AST 16 06/27/2022 1118   ALT 15 06/27/2022 1118   ALKPHOS 84 06/27/2022 1118   BILITOT 0.2 06/27/2022 1118    Renal Function   Lab Results  Component Value Date   CREATININE 1.12 06/27/2022   CREATININE 1.00 03/14/2022   CREATININE 0.89 12/22/2021    CrCl cannot be calculated (Patient's most recent lab result is older than the maximum 21 days allowed.).   [facility] Vascular Lab Studies:  11/06/22 VAS [place] LOWER EXTREMITY VENOUS LEFT (DVT)  Summary:  RIGHT:  - No evidence of common femoral vein obstruction.    LEFT:  - Findings consistent with age indeterminate deep vein thrombosis  involving the left femoral vein, left popliteal vein, and left posterior  tibial veins.  - No cystic structure found in the popliteal fossa.  Subcutaneous edema in area of calf and ankle.   Technician findings: Mid and distal FV are duplicated - thrombus seen in one of paired.  Difficulty imaging popliteal vein due to patient imobility (MS). Diffuse  subcutaneous edema seen in calf  and ankle. Peroneal vein not visualized on  this exam.   ASSESSMENT: Location of DVT: Left femoral vein, Left popliteal vein, Left distal vein Cause of DVT: provoked by a persistent risk factor - immobility related to quadriplegia and quadriparesis from MS. Cannot ambulate independently and uses motorized wheelchair. Given that this is a persistent risk factor for him, he will require indefinite anticoagulation. Will start Eliquis today. No drug interactions or contraindications present. Extensively counseled patient on the medication and he confirms understanding. LLE swelling is limited to below the knee, and he is having no pain. He already has compression stockings at home that he uses.   PLAN: -Start apixaban (Eliquis) 10 mg twice daily for 7 days followed by 5 mg twice daily. -Expected duration of therapy: Indefinite. Therapy started on 11/06/22. -Patient educated on purpose, proper use and potential adverse effects of apixaban (Eliquis). -Discussed importance of taking medication around the same time every day. -Advised patient of medications to avoid (NSAIDs, aspirin doses >100 mg daily). -Educated that Tylenol (acetaminophen) is the preferred analgesic to lower the risk of bleeding. -Advised patient to alert all providers of anticoagulation therapy prior to starting a new medication or having a procedure. -Emphasized importance of monitoring for signs and symptoms of bleeding (abnormal bruising, prolonged bleeding, nose bleeds, bleeding from gums, discolored urine, black tarry stools). -Educated patient to present to the ED if emergent signs and symptoms of new thrombosis occur. -Counseled patient to wear compression stockings daily, removing at night. Elevate legs as able.  -Filled starter pack of Eliquis today during his visit and refills have been sent to his preferred pharmacy.   Follow up: in 2 months in DVT Clinic. With PCP as otherwise indicated.   Pervis Hocking, PharmD, [place],  CPP Deep Vein Thrombosis Clinic Clinical Pharmacist Practitioner Office: [phone]  I have evaluated the patient's chart/imaging and refer this patient to the Clinical Pharmacist Practitioner for medication management. I have reviewed the CPP's documentation and agree with her assessment and plan. I was immediately available during the visit for questions and collaboration.  Due to immobility, recommend lifelong therapy   Victorino Sparrow, MD

## 2022-11-23 ENCOUNTER — Encounter (HOSPITAL_BASED_OUTPATIENT_CLINIC_OR_DEPARTMENT_OTHER): Payer: Self-pay

## 2023-01-05 ENCOUNTER — Other Ambulatory Visit: Payer: Self-pay | Admitting: Neurology

## 2023-01-09 ENCOUNTER — Ambulatory Visit (HOSPITAL_COMMUNITY): Payer: Medicaid Other

## 2023-01-23 NOTE — Progress Notes (Deleted)
PATIENT: Randy Shaw DOB: 1961/02/05  REASON FOR VISIT: follow up HISTORY FROM: patient  No chief complaint on file.   HISTORY OF PRESENT ILLNESS: Today 01/23/23:  Randy Shaw is a 62 y.o. male with a history of ***. Returns today for follow-up.      10/10/22: Randy Shaw is a 62 y.o. male with a history of multiple sclerosis. Returns today for follow-up.  Patient is being seen today for mobility assessment for power wheelchair.  I have reviewed the patient's physical therapy evaluation and agree with their assessment and plan.  Patient reports that he has a scratch/sore on his lower back on the right side.  Patient is currently using a motorized wheelchair.  He is unable to ambulate independently.  The motorized wheelchair will help him ambulate within his home to get to the bathroom, kitchen to get his meals and into his bedroom.  Patient has good strenght in the upper extremities. Slightly limited with ROM in in the LUE.  Reports that he has trouble with transfers. His son has to help. Has had falls in the past. He is currently able to use a motorized wheelchair.  I do not perceive that he would have any issues operating the motorized wheelchair due to physical or mental limitations.  He remains on Ocrevus.  Not sure that he can tell a difference since he started this.  We did repeat imaging after the last visit and scans were stable.     06/27/22: Randy Shaw is a 62 y.o. male with a history of Multiple Sclerosis. Returns today for follow-up. Currently on Ocrevus. Can't really tell a difference since he started. Feels that he is overall stable but disease is progressing slowly. No changes with bowels. Has a foley catheter for the last month d/t urinary rentention. Continues to have weakness on the left side that has progressively gotten worse. Lives with his Son. Son helps with ADLS. Uses a motorized wheelchair.  Continues on Keppra- no seizure events.    December 22, 2021 He received tocolytics and IV infusion in May 2023, tolerating it well, no significant change in his daily function, still able to manage transfer himself in and out of wheelchair, cooking, came into office by transportation   Laboratory evaluation showed normal negative TB, RPR, B12, vitamin D level was 14, A1c 5.7, negative hepatitis B, C,  UPDATE Jun 23 2021: We personally reviewed MRI of the brain with without contrast in December 2023, no contrast-enhancement, supratentorium stable MS lesions  Over past few months, He developed new onset bilateral feet paresthesia, Plantar surface needle prick pain, increased lower extremity spasticity, gait abnormality, increased difficulty transferring himself, yesterday, getting up from the toilet, fell,  He also has urinary urgency, taking Flomax 0.4 mg twice a day, is under the care of urologist.   Reviewed extensive laboratory evaluations, which showed normal negative B12, RPR, hepatitis panel, varicella-zoster antibody, CD19, 20, IgG level,   Vitamin D deficiency level was 15.6, mild elevated A1c 5.7  12/22/21: Randy Shaw, is a 62 year old male, previous patient of Dr. Anne Hahn for relapsing remitting multiple sclerosis, return for visit, also with new onset seizure on March 03, 2021   I reviewed and summarized the referring note.PMHX Hypertension Relapsing remitting multiple sclerosis   Patient was diagnosed with remitting multiple sclerosis in July 1995, he presented with right arm numbness, later developed numbness in his left arm, progressive gait difficulty since 2000,.  He also has double vision.  He was treated with Avonex in 1995 for one year, was switched to betaseron from 1996 till 2015, when he started to take Tecfidra.  Last flare up was around 2005.   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  The patient uses a wheelchair primarily since 2005. The patient is able to ambulate using a walker but  primarily uses the scooter.    Personally reviewed MRI of the brain with and without contrast in May 2021, multiple supra and infratentorial chronic demyelinating plaque, no change compared to March 2019 MRI of cervical spine with without contrast, multiple chronic demyelinating plaque from C2-C7, no change MRI of thoracic spine with without contrast August 16, 2020, scattered foci of abnormal T2 signal within the thoracic spinal cord, no cord compression,    Despite no imaging changes, patient continues complain some worsening symptoms since 2021, has worsening double vision, could no longer move left eye past midline, in addition, he noticed increased weakness of left arm and leg, could no longer lifting up left foot,  Also noticed increased difficulty urinate, required Foley catheter now,   New onset seizure March 03, 2021, that was witnessed by his son, now is  treated with Keppra 500 mg twice a day  He lives with his son, but still quite independent in his daily activity, can transfer himself,  /02/14/21: Randy Shaw  is a 61 y.o. male with a history of MS. He is here today for a follow up. He remains on the Tecfidera 240 mg twice daily and Baclofen 1 tab in the morning, 1 tab at noon, and 2 tabs at bedtime. He does not take the tizanidine at this time. He recently experienced some urinary retention in which he required a 2 week dose of prednisone. He endorses that his retention issues have resolved and overall his MS is doing well. He reports that he is mostly wheelchair bound and rarely walks. He is currently living with his son. Patient states he is having some LUE stiffness and pain with movement. He has not tried any OTC medications for this and was encouraged to do so. If pain and stiffness persist, encouraged patient to speak with his PCP for further workup.      HISTORY  Copied from Dr. Anne Hahn note 08/10/2020 Randy Shaw is a 62 year old right-handed black male with a history of  multiple sclerosis.  The patient has a significant problem with weakness in the legs, left greater than right.  He has fallen on occasion when trying to transfer from the bed to the wheelchair.  He has noted over the last several weeks that he has had some problems with voiding the bladder, he has been to the emergency room on 2 occasions on 01 July 2020, and then again on 18 July 2020.  The patient had a Foley catheter placed for about a week each time, but he has not been able to void the bladder once the catheter was removed.  He is followed through urology, he is seen by Dr. Annabell Howells.  The patient denies any low back pain or pain down the legs on either side.  He does have some altered sensation of the bladder and bowel, he has some urgency with the bowels, this is chronic for him.  There is some question of benign prostate enlargement.  He has not noted any new numbness or weakness of the arms or new vision changes, he does have chronic double vision when looking to the right.  He last had MRI of the brain and cervical spinal cord about a year ago.  He remains on Tecfidera.  REVIEW OF SYSTEMS: Out of a complete 14 system review of symptoms, the patient complains only of the following symptoms, and all other reviewed systems are negative.  ALLERGIES: Allergies  Allergen Reactions   Peanut-Containing Drug Products Shortness Of Breath   Zanaflex [Tizanidine Hcl] Shortness Of Breath   Interferon Beta-1a Other (See Comments)    HOME MEDICATIONS: Outpatient Medications Prior to Visit  Medication Sig Dispense Refill   apixaban (ELIQUIS) 5 MG TABS tablet Take 1 tablet (5 mg total) by mouth 2 (two) times daily. Start taking after completion of starter pack. 180 tablet 1   APIXABAN (ELIQUIS) VTE STARTER PACK (10MG  AND 5MG ) Take as directed on package: start with two-5mg  tablets twice daily for 7 days. On day 8, switch to one-5mg  tablet twice daily. 74 each 0   baclofen (LIORESAL) 20 MG tablet TAKE 1 TABLET  BY MOUTH EVERY MORNING, TAKE 1 TABLET AT NOON AND TAKE 2 TABLETS AT NIGHT 360 tablet 3   Cholecalciferol (VITAMIN D-3) 125 MCG (5000 UT) TABS Take 5,000 Units by mouth daily.     DULoxetine (CYMBALTA) 60 MG capsule TAKE 1 CAPSULE(60 MG) BY MOUTH DAILY 30 capsule 11   levETIRAcetam (KEPPRA) 500 MG tablet Take 1 tablet by mouth twice daily 180 tablet 0   losartan-hydrochlorothiazide (HYZAAR) 50-12.5 MG tablet Take 1 tablet by mouth daily.     Ocrelizumab (OCREVUS IV) Inject into the vein. Every 6 month IV infusion     tamsulosin (FLOMAX) 0.4 MG CAPS capsule Take 0.4 mg by mouth 2 (two) times daily.     No facility-administered medications prior to visit.    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    PAST SURGICAL HISTORY: Past Surgical History:  Procedure Laterality Date   COLONOSCOPY WITH PROPOFOL N/A 03/14/2022   Procedure: COLONOSCOPY WITH PROPOFOL;  Surgeon: Kathi Der, MD;  Location: [facility];  Service: Gastroenterology;  Laterality: N/A;   none      FAMILY HISTORY: Family History  Problem Relation Age of Onset   Cancer Mother        breast   Stroke Father    Cancer - Colon Father    Multiple sclerosis Neg Hx     SOCIAL HISTORY: Social History   Socioeconomic History   Marital status: Divorced    Spouse name: n/a   Number of children: 3   Years of education: 12   Highest education level: Not on file  Occupational History   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     Right Handed   Drinks 2-3 cups caffeine   Social Determinants of Health   Financial Resource Strain: Not on file  Food Insecurity: Not on file  Transportation Needs: Not on file  Physical Activity:  Not on file  Stress: Not on file  Social Connections: Not on file  Intimate Partner Violence: Not on file      PHYSICAL EXAM  There were no vitals filed for this visit.    There is no height or weight on file to calculate BMI.  Generalized: Well developed, in no acute distress   Neurological examination  Mentation: Alert oriented to time, place, history taking. Follows all commands speech and language fluent Cranial nerve II-XII: Pupils were equal round reactive to light.  Left INO. facial sensation and strength were normal. Uvula tongue midline. Head turning and shoulder shrug  were normal and symmetric. Motor: The motor testing reveals 5/5 strength in RUE 4/5 RLE and 4/5 LUE 3/5 LLE. Marland Kitchen Foot drop noted to the LLE. Sensory: Sensory testing is intact to soft touch on all 4 extremities. No evidence of extinction is noted.  Coordination: Cerebellar testing reveals ataxia bilaterally in the finger-nose-finger.  Gait and station: Not ambulated, patient is wheelchair bound.    DIAGNOSTIC DATA (LABS, IMAGING, TESTING) - I reviewed patient records, labs, notes, testing and imaging myself where available.  Lab Results  Component Value Date   WBC 4.7 06/27/2022   HGB 12.8 (L) 06/27/2022   HCT 40.3 06/27/2022   MCV 85 06/27/2022   PLT 233 06/27/2022      Component Value Date/Time   NA 146 (H) 06/27/2022 1118   K 3.7 06/27/2022 1118   CL 105 06/27/2022 1118   CO2 25 06/27/2022 1118   GLUCOSE 115 (H) 06/27/2022 1118   GLUCOSE 123 (H) 03/14/2022 1009   BUN 14 06/27/2022 1118   CREATININE 1.12 06/27/2022 1118   CREATININE 0.89 07/30/2013 0952   CALCIUM 9.8 06/27/2022 1118   PROT 6.5 06/27/2022  1118   ALBUMIN 4.2 06/27/2022 1118   AST 16 06/27/2022 1118   ALT 15 06/27/2022 1118   ALKPHOS 84 06/27/2022 1118   BILITOT 0.2 06/27/2022 1118   GFRNONAA >60 03/04/2021 0540   GFRNONAA >89 07/30/2013 0952   GFRAA 107 02/05/2020 1144   GFRAA >89 07/30/2013 0952   Lab Results  Component Value Date   CHOL 233 (H) 01/01/2019   HDL 67 01/01/2019   LDLCALC 149 (H) 01/01/2019   TRIG 98 01/01/2019   CHOLHDL 3.5 01/01/2019   Lab Results  Component Value Date   HGBA1C 5.7 (H) 03/10/2021   Lab Results  Component Value Date   VITAMINB12 527 03/10/2021   Lab Results  Component Value Date   TSH 2.280 12/22/2021      ASSESSMENT AND PLAN 62 y.o. year old male  has a past medical history of Abnormality of gait (04/22/2013), Asthma, BPH (benign prostatic hyperplasia), Hypertension, Multiple sclerosis (HCC), and Quadriplegia and quadriparesis (HCC) (03/08/2016). here with .    1. Multiple sclerosis-secondary progressive 2.  Seizures 3.  Abnormal gait and balance  - Continue Ocrevus infusions -Continue Baclofen 20 mg 4 times a day -Continue Keppra 500 mg twice a day -Order sent for motorized wheelchair  -Keep follow-up September all   Butch Penny, MSN, NP-C 01/23/2023, 3:19 PM [facility] [address], [address] [place], [place] [address] [phone]

## 2023-01-24 ENCOUNTER — Ambulatory Visit: Payer: Medicaid Other | Admitting: Adult Health

## 2023-02-05 ENCOUNTER — Telehealth: Payer: Self-pay

## 2023-02-05 NOTE — Telephone Encounter (Signed)
[facility] Mellody Dance) insurance information  did not come through clear in the fax. Also look like weight was not recorded, do you have pt's weight recorded? Could you refax that information Fax: [phone]; Phone: [phone]

## 2023-02-05 NOTE — Telephone Encounter (Signed)
Call to [facility], left message for ronald Joanie Coddington to return call. Patient has new insurance and infusions not covered with intrafusion.

## 2023-02-05 NOTE — Telephone Encounter (Signed)
re-faxed

## 2023-02-05 NOTE — Telephone Encounter (Signed)
Faxed orders, insurance, demographics and visit notes to [facility]. Next infusion scheduled 02/20/23

## 2023-02-08 NOTE — Telephone Encounter (Signed)
Please schedule patient with Dr. Terrace Arabia in January 2025, thanks

## 2023-02-12 ENCOUNTER — Telehealth: Payer: Self-pay

## 2023-02-12 NOTE — Telephone Encounter (Signed)
Pt has been scheduled.  °

## 2023-02-12 NOTE — Telephone Encounter (Signed)
faxed signed oreders to [facility]

## 2023-02-13 NOTE — Telephone Encounter (Signed)
Incoming call from Randy Shaw, to confirm nursing is going to get patient set up for ocrevus infusion

## 2023-02-20 NOTE — Telephone Encounter (Signed)
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 Pt states that it will have to be filed with [employer]. Pt will call back with the Term Date of his Oscar Ins. And will also call Medicaid with the Term date so that they can update their system as well. Please advise.

## 2023-02-21 NOTE — Telephone Encounter (Signed)
Orders Placed This Encounter  Procedures   Ambulatory referral to Home Health    

## 2023-02-21 NOTE — Telephone Encounter (Signed)
[facility] would be able to take this patient but he needs to have had an appointment here in the last 90 days in order to start home health or else insurance won't cover it. Once he has an appointment here, they will be able to start him.

## 2023-02-21 NOTE — Telephone Encounter (Signed)
I called [facility], I relayed to Jillyn Hidden, that order for ocrevus can be cancelled, as pt will receiving ocrevus thru infusion company [facility].  He verbalized understanding.

## 2023-02-21 NOTE — Telephone Encounter (Signed)
Mychart msg sent related to this waiting on response scheduled him:

## 2023-02-21 NOTE — Addendum Note (Signed)
Addended by: Levert Feinstein on: 02/21/2023 09:56 AM   Modules accepted: Orders

## 2023-02-21 NOTE — Telephone Encounter (Signed)
Dr. Terrace Arabia can we work him in? Thanks,  Production assistant, radio

## 2023-02-21 NOTE — Telephone Encounter (Signed)
Okay to double book him and next Wednesday with injection slots

## 2023-02-21 NOTE — Telephone Encounter (Signed)
Received call from [facility] about pt and his ocrevus.  They have not been able to reach him about his medication.  They need to speak to him relating to Ocrevus.  I took the # [phone], and relayed that I will need to call pt and relay for him to call them, but also call [facility] if home or outpt infusion.   I spoke to [place] ( with [facility]) .  Pt is scheduled 02-26-2023 at home for his infusion.  He said that [facility] is the specialty pharmacy with insurance and they get the notification of approval for ocrevus, so they reach out to pt.  He said that pt does not need to accept ocrevus from them ([facility] is handling that).  I relayed to pt on VM that he needs to call [facility] and just let them know that he does not need anything from them, that [facility] is handling.  Pt to call back if questions.  I did leave the #.

## 2023-02-26 ENCOUNTER — Ambulatory Visit: Payer: 59 | Admitting: Neurology

## 2023-02-26 ENCOUNTER — Encounter: Payer: Self-pay | Admitting: Neurology

## 2023-02-27 ENCOUNTER — Telehealth: Payer: Self-pay | Admitting: Neurology

## 2023-02-27 ENCOUNTER — Encounter: Payer: Self-pay | Admitting: Neurology

## 2023-02-27 ENCOUNTER — Ambulatory Visit (INDEPENDENT_AMBULATORY_CARE_PROVIDER_SITE_OTHER): Payer: Medicaid Other | Admitting: Neurology

## 2023-02-27 VITALS — BP 176/92 | HR 61 | Resp 15 | Ht 74.0 in

## 2023-02-27 DIAGNOSIS — R269 Unspecified abnormalities of gait and mobility: Secondary | ICD-10-CM | POA: Diagnosis not present

## 2023-02-27 DIAGNOSIS — G8250 Quadriplegia, unspecified: Secondary | ICD-10-CM | POA: Diagnosis not present

## 2023-02-27 DIAGNOSIS — G35 Multiple sclerosis: Secondary | ICD-10-CM | POA: Diagnosis not present

## 2023-02-27 NOTE — Telephone Encounter (Signed)
Call to Gerilyn Pilgrim at [facility], advised of Dr. Terrace Arabia order to discontinue ocrevus due to no reported improvement and stable MRI changes. Patient was infused on 02/26/23. Gerilyn Pilgrim verbalized understanding

## 2023-02-27 NOTE — Telephone Encounter (Signed)
Please cancel Ocrelizumab infusion order. He reported no improvement, will stop   MRI showed stable changes.

## 2023-02-27 NOTE — Progress Notes (Addendum)
[name]   PATIENT: Randy Shaw DOB: Dec 31, 1960  REASON FOR VISIT: follow up HISTORY FROM: patient  Chief Complaint  Patient presents with   Follow-up    Rm13, alone, Pt is here so that he can get get care w/[facility]: pt stated that he needs home health due to falls, pt will need manual bp recheck    DIAGNOSTIC DATA (LABS, IMAGING, TESTING) - I reviewed patient records, labs, notes, testing and imaging myself where available.     ASSESSMENT AND PLAN 62 y.o. year old male      1. Multiple sclerosis-secondary progressive 2.  Seizures 3.  Abnormal gait and balance  He has significant brain, cervical and thoracic lesion load, with his reported worsening symptoms in 2022, he was given a trial of bicalutamide from April 2023 to Oct 2024, he denies any improvement, continued progression of left side difficulty, difficulty transfer himself,  Repeat MRI of the brain, cervical, thoracic spine with and without contrast showed extensive lesion load, no contrast-enhancement  Will stop ocrelizumab,  Referral to home health aide, nursing, physical therapy, social worker,  Estate agent for new electronic wheelchair was completed, is in process  Return To Clinic With NP In 6 Months if there is no significant change stopping ocrelizumab, will continue with supportive care only, will see patient on a yearly basis  HISTORY OF PRESENT ILLNESS:  Randy Shaw,  previous patient of Dr. Anne Hahn for relapsing remitting multiple sclerosis,  also  had seizure on March 03, 2021   I reviewed and summarized the referring note.PMHX Hypertension Relapsing remitting multiple sclerosis   Patient was diagnosed with remitting multiple sclerosis in July 1995, he presented with right arm numbness, later developed numbness in his left arm, progressive gait difficulty since 2000,.  He also has double vision. He was treated with Avonex in 1995 for one year, was switched to betaseron from 1996 till 2015,  when he started to take Tecfidra.  Last flare up was around 2005.   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  The patient uses a wheelchair since 2005. The patient is able to ambulate using a walker but primarily uses the scooter.    MRI of the brain with and without contrast in May 2021, multiple supra and infratentorial chronic demyelinating plaque, no change compared to March 2019 MRI of cervical spine with without contrast, multiple chronic demyelinating plaque from C2-C7, no change MRI of thoracic spine with without contrast August 16, 2020, scattered foci of abnormal T2 signal within the thoracic spinal cord, no cord compression,    Despite no imaging changes, patient continues complain some worsening symptoms since 2021, has worsening double vision, could no longer move left eye past midline, in addition, he noticed increased weakness of left arm and leg,    He also noticed increased difficulty urinate, required Foley catheter now,   New onset seizure March 03, 2021, that was witnessed by his son,  taking Keppra 500 mg twice a day  He lives with his son, but still quite independent in his daily activity, can transfer himself  in and out of electronic wheelchair  Repeat MRIs showed extensive lesion involving brain in December 2022, but no contrast-enhancement  Consider possible secondary progressive MS with his complaints of worsening symptoms, he was given a trial of ocrelizumab treatment, initially infusion April 2023, most recent infusion was October 28th 2024, he denies any improvement, actually complains continued worsening of his difficulties, now he often  need his sons help to transfer in and out of electronic wheelchair, constant double vision, worse looking to the right side   Lab in Feb 2024: Hg 12.8. normal CMP, IG G 952,   Personally reviewed MRI of the brain with and without contrast in April 2024 Multiple T2/FLAIR hyperintense foci in the  cerebral hemispheres, brainstem and left middle cerebellar peduncle in a pattern consistent with chronic demyelinating plaque associated with multiple sclerosis.  None of the foci enhance or appear to be acute.  Compared to the MRI from 2022, there are no new lesions. Normal enhancement pattern.  No acute findings.  MRI cervical w/wo Multiple T2 hyperintense foci within the spinal cord and posterior fossa as detailed above consistent with demyelinating plaque associated with multiple sclerosis.  None of the foci enhance or appear to be acute.  No new lesions compared to the 2021 MRI. Degenerative changes at C2-C3 at C3-C4 that do not lead to spinal stenosis or nerve root compression.  These are stable compared to the 2021 MRI. MRI thoracic w/wo Multiple T2 hyperintense foci within the spinal cord as detailed above. None of the foci appear to be acute. They do not enhance. Compared to the MRI from 08/16/2020, there do not appear to be any new lesions. These are consistent with chronic demyelinating plaque associated with multiple sclerosis.     PHYSICAL EXAM  Vitals:   02/27/23 1044  BP: (!) 176/92  Pulse: 61  Resp: 15  Height: 6\' 2"  (1.88 m)   Body mass index is 25.68 kg/m.  Generalized: Well developed, in no acute distress   Neurological examination  Mentation: Alert oriented to time, place, history taking. Follows all commands speech and language fluent Cranial nerve II-XII: Pupils were equal round reactive to light.  Left INO,   facial sensation and strength were normal. Uvula tongue midline. Head turning and shoulder shrug  were normal and symmetric. Motor: Spastic diplegia, left more than right, antigravity movement of left upper extremity, drift, also have antigravity movement of left lower extremity, with moderate spasticity, Sensory: Sensory testing is intact to soft touch on all 4 extremities.  Deep tendon reflexes, hyperreflexia, Coordination: Cerebellar testing reveals ataxia  bilaterally in the finger-nose-finger.  Gait and station: Deferred  REVIEW OF SYSTEMS: Out of a complete 14 system review of symptoms, the patient complains only of the following symptoms, and all other reviewed systems are negative.  ALLERGIES: Allergies  Allergen Reactions   Peanut-Containing Drug Products Shortness Of Breath   Zanaflex [Tizanidine Hcl] Shortness Of Breath   Interferon Beta-1a Other (See Comments)    HOME MEDICATIONS: Outpatient Medications Prior to Visit  Medication Sig Dispense Refill   apixaban (ELIQUIS) 5 MG TABS tablet Take 1 tablet (5 mg total) by mouth 2 (two) times daily. Start taking after completion of starter pack. 180 tablet 1   APIXABAN (ELIQUIS) VTE STARTER PACK (10MG  AND 5MG ) Take as directed on package: start with two-5mg  tablets twice daily for 7 days. On day 8, switch to one-5mg  tablet twice daily. 74 each 0   baclofen (LIORESAL) 20 MG tablet TAKE 1 TABLET BY MOUTH EVERY MORNING, TAKE 1 TABLET AT NOON AND TAKE 2 TABLETS AT NIGHT 360 tablet 3   Cholecalciferol (VITAMIN D-3) 125 MCG (5000 UT) TABS Take 5,000 Units by mouth daily.     DULoxetine (CYMBALTA) 60 MG capsule TAKE 1 CAPSULE(60 MG) BY MOUTH DAILY 30 capsule 11   levETIRAcetam (KEPPRA) 500 MG tablet Take 1 tablet by mouth twice daily  180 tablet 0   losartan-hydrochlorothiazide (HYZAAR) 50-12.5 MG tablet Take 1 tablet by mouth daily.     Ocrelizumab (OCREVUS IV) Inject into the vein. Every 6 month IV infusion     tamsulosin (FLOMAX) 0.4 MG CAPS capsule Take 0.4 mg by mouth 2 (two) times daily.     No facility-administered medications prior to visit.    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    PAST SURGICAL HISTORY: Past Surgical History:  Procedure Laterality Date   COLONOSCOPY WITH  PROPOFOL N/A 03/14/2022   Procedure: COLONOSCOPY WITH PROPOFOL;  Surgeon: Kathi Der, MD;  Location: [facility];  Service: Gastroenterology;  Laterality: N/A;   none      FAMILY HISTORY: Family History  Problem Relation Age of Onset   Cancer Mother        breast   Stroke Father    Cancer - Colon Father    Multiple sclerosis Neg Hx     SOCIAL HISTORY: Social History   Socioeconomic History   Marital status: Divorced    Spouse name: n/a   Number of children: 3   Years of education: 12   Highest education level: Not on file  Occupational History   Occupation: disabled/retired 1999-[place]    Comment: Event organiser  Tobacco Use   Smoking status: Never   Smokeless tobacco: Never  Substance and Sexual Activity   Alcohol use: Not Currently    Alcohol/week: 0.0 standard drinks of alcohol    Comment: occasionally wine cooler   Drug use: No   Sexual activity: Never  Other Topics Concern   Not on file  Social History Narrative   Lives alone, house.     Right Handed   Drinks 2-3 cups caffeine   Social Determinants of Health   Financial Resource Strain: Not on file  Food Insecurity: Not on file  Transportation Needs: Not on file  Physical Activity: Not on file  Stress: Not on file  Social Connections: Not on file  Intimate Partner Violence: Not on file       Levert Feinstein, M.D. Ph.D.  [facility] [address] [place], [place] [address] Phone: [phone] Fax:      [phone]

## 2023-02-28 ENCOUNTER — Telehealth: Payer: Self-pay | Admitting: Neurology

## 2023-02-28 ENCOUNTER — Ambulatory Visit: Payer: 59 | Admitting: Neurology

## 2023-02-28 NOTE — Telephone Encounter (Signed)
[facility] is taking this patient. 

## 2023-03-05 ENCOUNTER — Telehealth: Payer: Self-pay

## 2023-03-19 ENCOUNTER — Encounter: Payer: Self-pay | Admitting: Adult Health

## 2023-03-19 ENCOUNTER — Ambulatory Visit: Payer: Medicaid Other | Admitting: Adult Health

## 2023-03-19 ENCOUNTER — Telehealth: Payer: Self-pay

## 2023-03-19 VITALS — BP 132/88 | HR 84 | Temp 97.8°F | Resp 18 | Ht 74.0 in | Wt 200.0 lb

## 2023-03-19 DIAGNOSIS — M792 Neuralgia and neuritis, unspecified: Secondary | ICD-10-CM | POA: Diagnosis not present

## 2023-03-19 DIAGNOSIS — G40909 Epilepsy, unspecified, not intractable, without status epilepticus: Secondary | ICD-10-CM | POA: Diagnosis not present

## 2023-03-19 DIAGNOSIS — G8250 Quadriplegia, unspecified: Secondary | ICD-10-CM

## 2023-03-19 DIAGNOSIS — Z23 Encounter for immunization: Secondary | ICD-10-CM | POA: Diagnosis not present

## 2023-03-19 DIAGNOSIS — G35 Multiple sclerosis: Secondary | ICD-10-CM

## 2023-03-19 DIAGNOSIS — I82512 Chronic embolism and thrombosis of left femoral vein: Secondary | ICD-10-CM | POA: Diagnosis not present

## 2023-03-19 DIAGNOSIS — I10 Essential (primary) hypertension: Secondary | ICD-10-CM

## 2023-03-19 DIAGNOSIS — Z7689 Persons encountering health services in other specified circumstances: Secondary | ICD-10-CM

## 2023-03-19 NOTE — Telephone Encounter (Signed)
Request for independent assessment for personal care services attestattion of medical need form faxed to [facility]

## 2023-03-19 NOTE — Progress Notes (Signed)
Marland Kitchen  [facility] clinic  Provider:  Kenard Gower DNP  Code Status:   Full Code  Goals of Care:     03/14/2022    9:37 AM  Advanced Directives  Does Patient Have a Medical Advance Directive? No  Would patient like information on creating a medical advance directive? No - Patient declined     Chief Complaint  Patient presents with   Establish Care    NEW PATIENT     HPI: Patient is a 62 y.o. male seen today to establish care with [facility].He was divorced and ex-wife has died. He lives with his 20 year old son. He was accompanied today by a church friend who is a retired Engineer, civil (consulting). He uses a motorized scooter. 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 He has indwelling foley catheter for 2 months now and is being changed monthly. He used to work as a Nurse, adult at [employer]. He is unable to write due to weakness. His left eye does not move beyond the midline. He can hold onto something during transfers but it is difficult for him. He is currently applying for CAP support.   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 Start taking after completion of starter pack.   baclofen (LIORESAL) 20 MG tablet TAKE 1 TABLET BY MOUTH EVERY  MORNING, TAKE 1 TABLET AT NOON AND TAKE 2 TABLETS AT NIGHT   Cholecalciferol (VITAMIN D-3) 125 MCG (5000 UT) TABS Take 5,000 Units by mouth daily.   DULoxetine (CYMBALTA) 60 MG capsule TAKE 1 CAPSULE(60 MG) BY MOUTH DAILY   levETIRAcetam (KEPPRA) 500 MG tablet Take 1 tablet by mouth twice daily   losartan-hydrochlorothiazide (HYZAAR) 50-12.5 MG tablet Take 1 tablet by mouth daily.   [DISCONTINUED] APIXABAN (ELIQUIS) VTE STARTER PACK (10MG  AND 5MG ) Take as directed on package: start with two-5mg  tablets twice daily for 7 days. On day 8, switch to one-5mg  tablet twice daily.   [DISCONTINUED] Ocrelizumab (OCREVUS IV) Inject into the vein. Every 6 month IV infusion   No facility-administered encounter medications on file as of 03/19/2023.    Review of Systems:  Review of Systems  Constitutional:  Negative for activity change, appetite change and fever.  HENT:  Negative for sore throat.   Eyes: Negative.   Cardiovascular:  Negative for chest pain and leg swelling.  Gastrointestinal:  Negative for abdominal distention, diarrhea and vomiting.  Genitourinary:  Negative for dysuria.  Skin:  Negative for color change.  Neurological:  Negative for dizziness and headaches.  Psychiatric/Behavioral:  Negative for behavioral problems and sleep disturbance. The patient is not nervous/anxious.     Health Maintenance  Topic Date Due   Zoster Vaccines- Shingrix (1 of 2) Never done  Fecal DNA (Cologuard)  03/09/2021   INFLUENZA VACCINE  11/30/2022   COVID-19 Vaccine (1 - 2023-24 season) Never done   DTaP/Tdap/Td (2 - Td or Tdap) 08/24/2027   Hepatitis C Screening  Completed   HIV Screening  Completed   HPV VACCINES  Aged Out    Physical Exam: Vitals:   03/19/23 1419  BP: 132/88  Pulse: 84  Resp: 18  Temp: 97.8 F (36.6 C)  SpO2: 93%  Weight: 200 lb (90.7 kg)  Height: 6\' 2"  (1.88 m)   Body mass index is 25.68 kg/m. Physical Exam Constitutional:      Appearance: Normal appearance.   HENT:     Head: Normocephalic and atraumatic.     Mouth/Throat:     Mouth: Mucous membranes are moist.  Eyes:     Conjunctiva/sclera: Conjunctivae normal.  Cardiovascular:     Rate and Rhythm: Normal rate and regular rhythm.     Pulses: Normal pulses.     Heart sounds: Normal heart sounds.  Pulmonary:     Effort: Pulmonary effort is normal.     Breath sounds: Normal breath sounds.  Abdominal:     General: Bowel sounds are normal.     Palpations: Abdomen is soft.  Musculoskeletal:        General: No swelling.     Cervical back: Normal range of motion.  Skin:    General: Skin is warm and dry.  Neurological:     Mental Status: He is alert and oriented to person, place, and time.     Comments: Has quadriparesis  Psychiatric:        Mood and Affect: Mood normal.        Behavior: Behavior normal.        Thought Content: Thought content normal.        Judgment: Judgment normal.     Labs reviewed: Basic Metabolic Panel: Recent Labs    06/27/22 1118  NA 146*  K 3.7  CL 105  CO2 25  GLUCOSE 115*  BUN 14  CREATININE 1.12  CALCIUM 9.8   Liver Function Tests: Recent Labs    06/27/22 1118  AST 16  ALT 15  ALKPHOS 84  BILITOT 0.2  PROT 6.5  ALBUMIN 4.2   No results for input(s): "LIPASE", "AMYLASE" in the last 8760 hours. No results for input(s): "AMMONIA" in the last 8760 hours. CBC: Recent Labs    06/27/22 1118  WBC 4.7  NEUTROABS 2.5  HGB 12.8*  HCT 40.3  MCV 85  PLT 233   Lipid Panel: No results for input(s): "CHOL", "HDL", "LDLCALC", "TRIG", "CHOLHDL", "LDLDIRECT" in the last 8760 hours. Lab Results  Component Value Date   HGBA1C 5.7 (H) 03/10/2021    Procedures since last visit: No results found.  Assessment/Plan  1. Encounter to establish care -  established care with [facility]  2. Chronic deep vein thrombosis (DVT) of femoral vein of left lower extremity (HCC) -   continue Apixaban  3. Seizure disorder [facility]) -  last seizure November  2022 -  continue levetiracetam -   seizure precautions  4. Multiple sclerosis (HCC) -  on electric scooter -  has foley catheter -  follows up with neurology, Dr. Levert Feinstein  5. Neuropathic pain -  continue Cymbalta  6. Primary hypertension -  BP 132/88, stable -  continue Losartan-HCTZ  7. Flu vaccine need - Flu vaccine trivalent PF, 6mos and older(Flulaval,Afluria,Fluarix,Fluzone)  8. Quadriplegia and quadriparesis (HCC) -  forms will be  filled up for CAP support, to assist at home for ADLs -  fall precautions   Labs/tests ordered:  None  Next appt:  Visit date not found

## 2023-03-24 NOTE — Progress Notes (Incomplete)
Marland Kitchen  [facility] clinic  Provider:  Kenard Gower DNP  Code Status:   Full Code  Goals of Care:     03/14/2022    9:37 AM  Advanced Directives  Does Patient Have a Medical Advance Directive? No  Would patient like information on creating a medical advance directive? No - Patient declined     Chief Complaint  Patient presents with  . Establish Care    NEW PATIENT     HPI: Patient is a 62 y.o. male seen today to establish care with [facility].He was divorced and ex-wife has died. He lives with his 47 year old son. He was accompanied today by a church friend who is a retired Engineer, civil (consulting). He uses a motorized scooter. 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 He has indwelling foley catheter for 2 months now and is being changed monthly. He used to work as a Nurse, adult at [employer]. He is unable to write due to weakness. His left eye does not move beyond the midline. He can hold onto something during transfers but it is difficult for him. He is currently applying for CAP support.   Past Medical History:  Diagnosis Date  . Abnormality of gait 04/22/2013  . Asthma   . BPH (benign prostatic hyperplasia)   . Hypertension   . Multiple sclerosis (HCC)   . 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 COLONOSCOPY WITH PROPOFOL N/A 03/14/2022   Procedure: COLONOSCOPY WITH PROPOFOL;  Surgeon: Kathi Der, MD;  Location: [facility];  Service: Gastroenterology;  Laterality: N/A;  . none      Allergies  Allergen Reactions  . Peanut-Containing Drug Products Shortness Of Breath  . Zanaflex [Tizanidine Hcl] Shortness Of Breath  . Interferon Beta-1a Other (See Comments)    Outpatient Encounter Medications as of 03/19/2023  Medication Sig  . apixaban (ELIQUIS) 5 MG TABS tablet Take 1 tablet (5 mg total) by mouth 2 (two) times daily. Start taking after completion of starter pack.  . baclofen (LIORESAL) 20 MG tablet TAKE 1 TABLET BY MOUTH  EVERY MORNING, TAKE 1 TABLET AT NOON AND TAKE 2 TABLETS AT NIGHT  . Cholecalciferol (VITAMIN D-3) 125 MCG (5000 UT) TABS Take 5,000 Units by mouth daily.  . DULoxetine (CYMBALTA) 60 MG capsule TAKE 1 CAPSULE(60 MG) BY MOUTH DAILY  . levETIRAcetam (KEPPRA) 500 MG tablet Take 1 tablet by mouth twice daily  . losartan-hydrochlorothiazide (HYZAAR) 50-12.5 MG tablet Take 1 tablet by mouth daily.  . [DISCONTINUED] APIXABAN (ELIQUIS) VTE STARTER PACK (10MG  AND 5MG ) Take as directed on package: start with two-5mg  tablets twice daily for 7 days. On day 8, switch to one-5mg  tablet twice daily.  . [DISCONTINUED] Ocrelizumab (OCREVUS IV) Inject into the vein. Every 6 month IV infusion   No facility-administered encounter medications on file as of 03/19/2023.    Review of Systems:  Review of Systems  Constitutional:  Negative for activity change, appetite change and fever.  HENT:  Negative for sore throat.   Eyes: Negative.   Cardiovascular:  Negative for chest pain and leg swelling.  Gastrointestinal:  Negative for abdominal distention, diarrhea and vomiting.  Genitourinary:  Negative for dysuria.  Skin:  Negative for color change.  Neurological:  Negative for dizziness and headaches.  Psychiatric/Behavioral:  Negative for behavioral problems and sleep disturbance. The patient is not nervous/anxious.     Health Maintenance  Topic Date Due  . Zoster Vaccines- Shingrix (1 of 2) Never done  .  Fecal DNA (Cologuard)  03/09/2021  . INFLUENZA VACCINE  11/30/2022  . COVID-19 Vaccine (1 - 2023-24 season) Never done  . DTaP/Tdap/Td (2 - Td or Tdap) 08/24/2027  . Hepatitis C Screening  Completed  . HIV Screening  Completed  . HPV VACCINES  Aged Out    Physical Exam: Vitals:   03/19/23 1419  BP: 132/88  Pulse: 84  Resp: 18  Temp: 97.8 F (36.6 C)  SpO2: 93%  Weight: 200 lb (90.7 kg)  Height: 6\' 2"  (1.88 m)   Body mass index is 25.68 kg/m. Physical Exam Constitutional:      Appearance:  Normal appearance.  HENT:     Head: Normocephalic and atraumatic.     Mouth/Throat:     Mouth: Mucous membranes are moist.  Eyes:     Conjunctiva/sclera: Conjunctivae normal.  Cardiovascular:     Rate and Rhythm: Normal rate and regular rhythm.     Pulses: Normal pulses.     Heart sounds: Normal heart sounds.  Pulmonary:     Effort: Pulmonary effort is normal.     Breath sounds: Normal breath sounds.  Abdominal:     General: Bowel sounds are normal.     Palpations: Abdomen is soft.  Musculoskeletal:        General: No swelling.     Cervical back: Normal range of motion.  Skin:    General: Skin is warm and dry.  Neurological:     Mental Status: He is alert and oriented to person, place, and time.     Comments: Has quadriparesis  Psychiatric:        Mood and Affect: Mood normal.        Behavior: Behavior normal.        Thought Content: Thought content normal.        Judgment: Judgment normal.     Labs reviewed: Basic Metabolic Panel: Recent Labs    06/27/22 1118  NA 146*  K 3.7  CL 105  CO2 25  GLUCOSE 115*  BUN 14  CREATININE 1.12  CALCIUM 9.8   Liver Function Tests: Recent Labs    06/27/22 1118  AST 16  ALT 15  ALKPHOS 84  BILITOT 0.2  PROT 6.5  ALBUMIN 4.2   No results for input(s): "LIPASE", "AMYLASE" in the last 8760 hours. No results for input(s): "AMMONIA" in the last 8760 hours. CBC: Recent Labs    06/27/22 1118  WBC 4.7  NEUTROABS 2.5  HGB 12.8*  HCT 40.3  MCV 85  PLT 233   Lipid Panel: No results for input(s): "CHOL", "HDL", "LDLCALC", "TRIG", "CHOLHDL", "LDLDIRECT" in the last 8760 hours. Lab Results  Component Value Date   HGBA1C 5.7 (H) 03/10/2021    Procedures since last visit: No results found.  Assessment/Plan  1. Encounter to establish care -  established care with [facility]  2. Chronic deep vein thrombosis (DVT) of femoral vein of left lower extremity (HCC) -   continue Apixaban  3. Seizure disorder [facility]) -  last  seizure November 2022 -  continue levetiracetam -   seizure precautions  4. Multiple sclerosis (HCC) -  on electric scooter -  has foley catheter -  follows up with neurology, Dr. Levert Feinstein  5. Neuropathic pain -  continue Cymbalta  6. Primary hypertension -  BP 132/88, stable -  continue Losartan-HCTZ  7. Flu vaccine need - Flu vaccine trivalent PF, 6mos and older(Flulaval,Afluria,Fluarix,Fluzone)  8. Quadriplegia and quadriparesis (HCC) -  forms will be  filled up for CAP support, to assist at home for ADLs -  fall precautions   Labs/tests ordered:  None  Next appt:  Visit date not found

## 2023-03-26 ENCOUNTER — Telehealth: Payer: Self-pay

## 2023-03-26 NOTE — Telephone Encounter (Signed)
Power wheelchair request sent to [employer]

## 2023-03-26 NOTE — Telephone Encounter (Signed)
We have this and will look into it

## 2023-03-26 NOTE — Telephone Encounter (Signed)
[employer] called stating that the pts form for his Power Wheel Chair needs questions 19-30 answered then faxed to them. Please advise.

## 2023-03-27 NOTE — Telephone Encounter (Signed)
Patient last saw Dr Terrace Arabia.

## 2023-04-02 ENCOUNTER — Ambulatory Visit: Payer: Medicaid Other | Admitting: Adult Health

## 2023-04-02 ENCOUNTER — Encounter: Payer: Self-pay | Admitting: Adult Health

## 2023-04-02 VITALS — BP 121/78 | HR 87 | Temp 97.5°F | Resp 18 | Ht 74.0 in

## 2023-04-02 DIAGNOSIS — I82512 Chronic embolism and thrombosis of left femoral vein: Secondary | ICD-10-CM | POA: Diagnosis not present

## 2023-04-02 DIAGNOSIS — M792 Neuralgia and neuritis, unspecified: Secondary | ICD-10-CM | POA: Diagnosis not present

## 2023-04-02 DIAGNOSIS — G40909 Epilepsy, unspecified, not intractable, without status epilepticus: Secondary | ICD-10-CM

## 2023-04-02 DIAGNOSIS — Z131 Encounter for screening for diabetes mellitus: Secondary | ICD-10-CM

## 2023-04-02 DIAGNOSIS — G35 Multiple sclerosis: Secondary | ICD-10-CM | POA: Diagnosis not present

## 2023-04-02 DIAGNOSIS — I10 Essential (primary) hypertension: Secondary | ICD-10-CM

## 2023-04-02 DIAGNOSIS — Z125 Encounter for screening for malignant neoplasm of prostate: Secondary | ICD-10-CM

## 2023-04-02 NOTE — Telephone Encounter (Signed)
[employer], Maralyn Sago Form CMN for was faxed back without 19-30 completed. Please complete 19-30 and refax. Contact information [phone]

## 2023-04-02 NOTE — Progress Notes (Unsigned)
[facility] clinic  Provider:   Code Status: *** Goals of Care:     03/19/2023    3:08 PM  Advanced Directives  Does Patient Have a Medical Advance Directive? Yes  Type of Advance Directive Living will  Does patient want to make changes to medical advance directive? No - Patient declined     Chief Complaint  Patient presents with   Medical Management of Chronic Issues    2 weeks follow-up    Immunizations    Shingrix and Covid   Health Maintenance    Cologuard    HPI: Patient is a 62 y.o. male seen today for an acute visit for  LUE and LLE weakness Uses scooter  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    Past Surgical History:  Procedure Laterality Date   COLONOSCOPY WITH PROPOFOL N/A 03/14/2022   Procedure: COLONOSCOPY WITH PROPOFOL;  Surgeon: Kathi Der, MD;  Location: [facility];  Service: Gastroenterology;  Laterality: N/A;   none      Allergies  Allergen Reactions   Peanut-Containing Drug Products Shortness Of Breath   Zanaflex [Tizanidine Hcl] Shortness Of Breath   Interferon Beta-1a Other (See Comments)    Outpatient Encounter Medications as of 04/02/2023  Medication Sig   apixaban (ELIQUIS) 5 MG TABS tablet Take 1 tablet (5 mg total) by mouth 2 (two) times daily. Start taking after completion of starter pack.   baclofen (LIORESAL) 20 MG tablet TAKE 1 TABLET BY MOUTH EVERY MORNING, TAKE 1 TABLET AT NOON AND TAKE 2 TABLETS AT NIGHT   Cholecalciferol (VITAMIN D-3) 125 MCG (5000 UT) TABS Take 5,000 Units by mouth daily.   DULoxetine (CYMBALTA) 60 MG capsule TAKE 1 CAPSULE(60 MG) BY MOUTH DAILY   levETIRAcetam (KEPPRA) 500 MG tablet Take 1 tablet by mouth twice daily   losartan-hydrochlorothiazide (HYZAAR) 50-12.5 MG tablet Take 1 tablet by mouth daily.   No  facility-administered encounter medications on file as of 04/02/2023.    Review of Systems:  Review of Systems  Constitutional:  Negative for activity change, appetite change and fever.  HENT:  Negative for sore throat.   Eyes: Negative.   Cardiovascular:  Negative for chest pain and leg swelling.  Gastrointestinal:  Negative for abdominal distention, diarrhea and vomiting.  Genitourinary:  Negative for dysuria, frequency and urgency.  Skin:  Negative for color change.  Neurological:  Negative for dizziness and headaches.  Psychiatric/Behavioral:  Negative for behavioral problems and sleep disturbance. The patient is not nervous/anxious.     Health Maintenance  Topic Date Due   Zoster Vaccines- Shingrix (1 of 2) Never done   Fecal DNA (Cologuard)  03/09/2021   COVID-19 Vaccine (1 - 2023-24 season) Never done   DTaP/Tdap/Td (2 - Td or Tdap) 08/24/2027   INFLUENZA VACCINE  Completed   Hepatitis C Screening  Completed   HIV Screening  Completed   HPV VACCINES  Aged Out    Physical Exam: Vitals:   04/02/23 1433  BP: 121/78  Pulse: 87  Resp: 18  Temp: (!) 97.5 F (36.4 C)  SpO2: 99%  Height: 6\' 2"  (1.88 m)   Body mass index is 25.68 kg/m. Physical Exam Constitutional:      Appearance: Normal appearance.  HENT:     Head: Normocephalic and atraumatic.  Mouth/Throat:     Mouth: Mucous membranes are moist.  Eyes:     Conjunctiva/sclera: Conjunctivae normal.  Cardiovascular:     Rate and Rhythm: Normal rate and regular rhythm.     Pulses: Normal pulses.     Heart sounds: Normal heart sounds.  Pulmonary:     Effort: Pulmonary effort is normal.     Breath sounds: Normal breath sounds.  Abdominal:     General: Bowel sounds are normal.     Palpations: Abdomen is soft.  Genitourinary:    Comments: Has foley catheter Musculoskeletal:        General: Swelling present. Normal range of motion.     Cervical back: Normal range of motion.     Left lower leg: Edema  present.     Comments: LLE 2+edema Weakness on LUE and LLE  Skin:    General: Skin is warm and dry.     Comments: Scabs on left buttock  Neurological:     General: No focal deficit present.     Mental Status: He is alert and oriented to person, place, and time.  Psychiatric:        Mood and Affect: Mood normal.        Behavior: Behavior normal.        Thought Content: Thought content normal.        Judgment: Judgment normal.     Labs reviewed: Basic Metabolic Panel: Recent Labs    06/27/22 1118  NA 146*  K 3.7  CL 105  CO2 25  GLUCOSE 115*  BUN 14  CREATININE 1.12  CALCIUM 9.8   Liver Function Tests: Recent Labs    06/27/22 1118  AST 16  ALT 15  ALKPHOS 84  BILITOT 0.2  PROT 6.5  ALBUMIN 4.2   No results for input(s): "LIPASE", "AMYLASE" in the last 8760 hours. No results for input(s): "AMMONIA" in the last 8760 hours. CBC: Recent Labs    06/27/22 1118  WBC 4.7  NEUTROABS 2.5  HGB 12.8*  HCT 40.3  MCV 85  PLT 233   Lipid Panel: No results for input(s): "CHOL", "HDL", "LDLCALC", "TRIG", "CHOLHDL", "LDLDIRECT" in the last 8760 hours. Lab Results  Component Value Date   HGBA1C 5.7 (H) 03/10/2021    Procedures since last visit: No results found.  Assessment/Plan    Labs/tests ordered:    Next appt:  Visit date not found

## 2023-04-02 NOTE — Telephone Encounter (Signed)
Faxed updated form for wheelchair back to [place] [employer]

## 2023-04-02 NOTE — Patient Instructions (Signed)

## 2023-04-02 NOTE — Telephone Encounter (Signed)
We placed in health records box for Randy Shaw to pick up will route to her

## 2023-04-03 ENCOUNTER — Other Ambulatory Visit: Payer: Self-pay | Admitting: Adult Health

## 2023-04-03 DIAGNOSIS — E782 Mixed hyperlipidemia: Secondary | ICD-10-CM

## 2023-04-03 LAB — COMPLETE METABOLIC PANEL WITH GFR
AG Ratio: 1.6 (calc) (ref 1.0–2.5)
ALT: 18 U/L (ref 9–46)
AST: 19 U/L (ref 10–35)
Albumin: 4.7 g/dL (ref 3.6–5.1)
Alkaline phosphatase (APISO): 90 U/L (ref 35–144)
BUN: 18 mg/dL (ref 7–25)
CO2: 33 mmol/L — ABNORMAL HIGH (ref 20–32)
Calcium: 10.5 mg/dL — ABNORMAL HIGH (ref 8.6–10.3)
Chloride: 103 mmol/L (ref 98–110)
Creat: 0.92 mg/dL (ref 0.70–1.35)
Globulin: 3 g/dL (ref 1.9–3.7)
Glucose, Bld: 92 mg/dL (ref 65–139)
Potassium: 4 mmol/L (ref 3.5–5.3)
Sodium: 144 mmol/L (ref 135–146)
Total Bilirubin: 0.6 mg/dL (ref 0.2–1.2)
Total Protein: 7.7 g/dL (ref 6.1–8.1)
eGFR: 93 mL/min/{1.73_m2} (ref >=60)

## 2023-04-03 LAB — LIPID PANEL
Cholesterol: 241 mg/dL — ABNORMAL HIGH (ref <200)
HDL: 68 mg/dL (ref >=40)
LDL Cholesterol (Calc): 150 mg/dL — ABNORMAL HIGH
Non-HDL Cholesterol (Calc): 173 mg/dL — ABNORMAL HIGH (ref <130)
Total CHOL/HDL Ratio: 3.5 (calc) (ref <5.0)
Triglycerides: 114 mg/dL (ref <150)

## 2023-04-03 LAB — CBC WITH DIFFERENTIAL/PLATELET
Absolute Lymphocytes: 1613 {cells}/uL (ref 850–3900)
Absolute Monocytes: 675 {cells}/uL (ref 200–950)
Basophils Absolute: 60 {cells}/uL (ref 0–200)
Basophils Relative: 0.8 %
Eosinophils Absolute: 270 {cells}/uL (ref 15–500)
Eosinophils Relative: 3.6 %
HCT: 44.2 % (ref 38.5–50.0)
Hemoglobin: 14 g/dL (ref 13.2–17.1)
MCH: 26.4 pg — ABNORMAL LOW (ref 27.0–33.0)
MCHC: 31.7 g/dL — ABNORMAL LOW (ref 32.0–36.0)
MCV: 83.2 fL (ref 80.0–100.0)
MPV: 11.6 fL (ref 7.5–12.5)
Monocytes Relative: 9 %
Neutro Abs: 4883 {cells}/uL (ref 1500–7800)
Neutrophils Relative %: 65.1 %
Platelets: 262 10*3/uL (ref 140–400)
RBC: 5.31 10*6/uL (ref 4.20–5.80)
RDW: 12.6 % (ref 11.0–15.0)
Total Lymphocyte: 21.5 %
WBC: 7.5 10*3/uL (ref 3.8–10.8)

## 2023-04-03 LAB — HEMOGLOBIN A1C
Hgb A1c MFr Bld: 5.7 %{Hb} — ABNORMAL HIGH (ref <5.7)
Mean Plasma Glucose: 117 mg/dL
eAG (mmol/L): 6.5 mmol/L

## 2023-04-03 LAB — PSA: PSA: 3.93 ng/mL (ref <=4.00)

## 2023-04-03 NOTE — Progress Notes (Signed)
-    cholesterol 241, elevated -  LDL 150, elevated -  triglycerides, normal -  will repeat lipid panel on next visit (needs to be fasting lab) -   liver enzymes normal -  not anemic - A1C 5.7, ranging as prediabetic -  PSA normal

## 2023-04-04 ENCOUNTER — Telehealth: Payer: Self-pay

## 2023-04-04 NOTE — Telephone Encounter (Signed)
Orders faxed to [facility]

## 2023-04-19 ENCOUNTER — Other Ambulatory Visit: Payer: Self-pay | Admitting: Neurology

## 2023-05-07 ENCOUNTER — Ambulatory Visit: Payer: 59 | Admitting: Neurology

## 2023-05-19 ENCOUNTER — Other Ambulatory Visit: Payer: Self-pay | Admitting: Vascular Surgery

## 2023-05-21 ENCOUNTER — Other Ambulatory Visit: Payer: Self-pay

## 2023-05-21 DIAGNOSIS — I82512 Chronic embolism and thrombosis of left femoral vein: Secondary | ICD-10-CM

## 2023-05-21 MED ORDER — APIXABAN 5 MG PO TABS: 5.0000 mg | ORAL_TABLET | Freq: Two times a day (BID) | ORAL | 3 refills | Status: DC

## 2023-06-25 ENCOUNTER — Telehealth: Payer: Self-pay

## 2023-06-25 NOTE — Telephone Encounter (Signed)
 Patient Friend  and call this afternoon because there at [facility], patient states that he has change insurance and will not cover with the new insurance.  Please respond and advise.   Message sent to Medina-Vargas, Margit Banda, NP and Lauralee Evener, Referral Coordinator

## 2023-06-25 NOTE — Telephone Encounter (Signed)
 Pls ask patient which neurology is covered by his insurance. Thanks.

## 2023-06-26 NOTE — Telephone Encounter (Signed)
 Noted.

## 2023-06-26 NOTE — Telephone Encounter (Signed)
 Patient stated he is going to call the neurology back to get more details about insurance not covering anymore and give [place] a call back once he finds out which one will be covered by insurance.

## 2023-06-30 ENCOUNTER — Other Ambulatory Visit: Payer: Self-pay | Admitting: Neurology

## 2023-07-02 NOTE — Telephone Encounter (Signed)
 Rx refilled.

## 2023-07-09 ENCOUNTER — Encounter: Payer: Self-pay | Admitting: Adult Health

## 2023-07-09 ENCOUNTER — Ambulatory Visit: Payer: Medicaid Other | Admitting: Adult Health

## 2023-07-09 VITALS — BP 127/87 | HR 96 | Temp 97.3°F | Resp 18 | Ht 74.0 in | Wt 200.0 lb

## 2023-07-09 DIAGNOSIS — R7303 Prediabetes: Secondary | ICD-10-CM

## 2023-07-09 DIAGNOSIS — G35 Multiple sclerosis: Secondary | ICD-10-CM

## 2023-07-09 DIAGNOSIS — M792 Neuralgia and neuritis, unspecified: Secondary | ICD-10-CM | POA: Diagnosis not present

## 2023-07-09 DIAGNOSIS — R339 Retention of urine, unspecified: Secondary | ICD-10-CM

## 2023-07-09 DIAGNOSIS — I10 Essential (primary) hypertension: Secondary | ICD-10-CM

## 2023-07-09 DIAGNOSIS — R079 Chest pain, unspecified: Secondary | ICD-10-CM

## 2023-07-09 DIAGNOSIS — G40909 Epilepsy, unspecified, not intractable, without status epilepticus: Secondary | ICD-10-CM

## 2023-07-09 DIAGNOSIS — I82512 Chronic embolism and thrombosis of left femoral vein: Secondary | ICD-10-CM | POA: Diagnosis not present

## 2023-07-09 NOTE — Progress Notes (Unsigned)
 [facility] clinic  Provider:  Kenard Gower DNP  Code Status:  Full Code  Goals of Care:     03/19/2023    3:08 PM  Advanced Directives  Does Patient Have a Medical Advance Directive? Yes  Type of Advance Directive Living will  Does patient want to make changes to medical advance directive? No - Patient declined     Chief Complaint  Patient presents with   Medical Management of Chronic Issues    3 mths    Discussed the use of AI scribe software for clinical note transcription with the patient, who gave verbal consent to proceed.  HPI: Patient is a 63 y.o. male seen today for an acute visit for a 15-month follow up of chronic medical issues.  He experienced chest pain over the weekend, lasting four to five hours on both Saturday and Sunday, associated with tightness in the left arm. He currently does not feel chest pain but mentions ongoing tightness in the left arm. He has no history of heart problems.  He has a history of multiple sclerosis (MS) and experiences left-sided weakness affecting both the arm and leg. He takes baclofen routinely for MS management.  He suffers from neuropathy affecting the entire left side, including the hands and feet. He takes Cymbalta for pain management but reports that the neuropathy is 'not good at all.'  He has a history of deep vein thrombosis (DVT) on the left side and is on Eliquis for this condition.   He has a history of hypertension, managed with losartan HCTC, and his blood pressure is currently well-controlled at 127/87 mmHg.  He has a history of seizures and is on Keppra for seizure management. He reports no recent seizures.  He has urinary retention and uses a Foley catheter, which is changed monthly.     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     Past Surgical History:  Procedure Laterality Date   COLONOSCOPY WITH PROPOFOL N/A 03/14/2022   Procedure: COLONOSCOPY WITH PROPOFOL;  Surgeon: Kathi Der, MD;  Location: [facility];  Service: Gastroenterology;  Laterality: N/A;   none      Allergies  Allergen Reactions   Peanut-Containing Drug Products Shortness Of Breath   Zanaflex [Tizanidine Hcl] Shortness Of Breath   Interferon Beta-1a Other (See Comments)    Outpatient Encounter Medications as of 07/09/2023  Medication Sig   apixaban (ELIQUIS) 5 MG TABS tablet Take 1 tablet (5 mg total) by mouth 2 (two) times daily. Start taking after completion of starter pack.   baclofen (LIORESAL) 20 MG tablet TAKE 1 TABLET BY MOUTH EVERY MORNING, TAKE 1 TABLET AT NOON AND TAKE 2 TABLETS AT NIGHT   Cholecalciferol (VITAMIN D-3) 125 MCG (5000 UT) TABS Take 5,000 Units by mouth daily.   DULoxetine (CYMBALTA) 60 MG capsule Take 1 capsule by mouth once daily   ELIQUIS 5 MG TABS tablet TAKE 1 TABLET BY MOUTH TWICE DAILY START  TAKING  AFTER  COMPLETION  OF  STARTER  PACK   levETIRAcetam (KEPPRA) 500 MG tablet Take 1 tablet by mouth twice daily   losartan-hydrochlorothiazide (HYZAAR) 50-12.5 MG tablet Take 1 tablet by mouth daily.   No facility-administered encounter medications on file as of 07/09/2023.    Review of Systems:  Review of Systems  Constitutional:  Negative for activity change, appetite change and fever.  HENT:  Negative for sore throat.   Eyes: Negative.   Cardiovascular:  Positive for chest pain. Negative for leg swelling.  Gastrointestinal:  Negative for abdominal distention, diarrhea and vomiting.  Genitourinary:  Negative for dysuria, frequency and urgency.  Skin:  Negative for color change.  Neurological:  Positive for weakness. Negative for dizziness and headaches.  Psychiatric/Behavioral:  Negative for behavioral problems and sleep disturbance. The patient is not nervous/anxious.     Health Maintenance   Topic Date Due   Zoster Vaccines- Shingrix (1 of 2) Never done   Fecal DNA (Cologuard)  03/09/2021   COVID-19 Vaccine (1 - 2024-25 season) Never done   DTaP/Tdap/Td (2 - Td or Tdap) 08/24/2027   INFLUENZA VACCINE  Completed   Hepatitis C Screening  Completed   HIV Screening  Completed   HPV VACCINES  Aged Out    Physical Exam: Vitals:   07/09/23 1437  BP: 127/87  Pulse: 96  Resp: 18  Temp: (!) 97.3 F (36.3 C)  SpO2: 95%  Weight: 200 lb (90.7 kg)  Height: 6\' 2"  (1.88 m)   Body mass index is 25.68 kg/m. Physical Exam Constitutional:      Appearance: Normal appearance.  HENT:     Head: Normocephalic and atraumatic.     Mouth/Throat:     Mouth: Mucous membranes are moist.  Eyes:     Conjunctiva/sclera: Conjunctivae normal.  Cardiovascular:     Rate and Rhythm: Normal rate and regular rhythm.     Pulses: Normal pulses.     Heart sounds: Normal heart sounds.  Pulmonary:     Effort: Pulmonary effort is normal.     Breath sounds: Normal breath sounds.  Abdominal:     General: Bowel sounds are normal.     Palpations: Abdomen is soft.  Genitourinary:    Comments: Foley catheter Musculoskeletal:        General: No swelling. Normal range of motion.     Cervical back: Normal range of motion.  Skin:    General: Skin is warm and dry.  Neurological:     General: No focal deficit present.     Mental Status: He is alert and oriented to person, place, and time.     Comments: Left-sided weakness  Psychiatric:        Mood and Affect: Mood normal.        Behavior: Behavior normal.     Labs reviewed: Basic Metabolic Panel: Recent Labs    04/02/23 1527  NA 144  K 4.0  CL 103  CO2 33*  GLUCOSE 92  BUN 18  CREATININE 0.92  CALCIUM 10.5*   Liver Function Tests: Recent Labs    04/02/23 1527  AST 19  ALT 18  BILITOT 0.6  PROT 7.7   No results for input(s): "LIPASE", "AMYLASE" in the last 8760 hours. No results for input(s): "AMMONIA" in the last 8760  hours. CBC: Recent Labs    04/02/23 1527  WBC 7.5  NEUTROABS 4,883  HGB 14.0  HCT 44.2  MCV 83.2  PLT 262   Lipid Panel: Recent Labs    04/02/23 1527  CHOL 241*  HDL 68  LDLCALC 150*  TRIG 114  CHOLHDL 3.5   Lab Results  Component Value Date   HGBA1C 5.7 (H) 04/02/2023    Procedures since last visit: No results found.  Assessment/Plan  1. Chest pain, unspecified type (Primary) -  New onset chest pain over  the weekend lasting for several hours on two consecutive days. No current chest pain. Left arm tightness noted. No prior history of heart problems. -Perform EKG today to assess for any abnormalities - EKG 12-Lead showed sinus rhythm - CBC with Differential/Platelets  2. Multiple sclerosis (HCC) -  Chronic condition managed with Baclofen. Recent episode of being bed-bound for two days due to inability to stand. -Continue Baclofen as prescribed.  3. Neuropathic pain -  Persistent neuropathy on the left side, including hands and feet. Not believed to be related to MS. Currently on Cymbalta for pain management. -Continue Cymbalta as prescribed.  4. Chronic deep vein thrombosis (DVT) of femoral vein of left lower extremity (HCC) -  Chronic left-sided DVT managed with Eliquis. -Continue Eliquis as prescribed.  5. Primary hypertension -  Well-controlled with Losartan HCTZ. Blood pressure today 127/87. -Continue Losartan HCTZ as prescribed. - Basic Metabolic Panel with eGFR  6. Seizure disorder (HCC) -  No recent seizures reported. Currently managed with Keppra. -Continue Keppra as prescribed -  follows up with neurology  7. Urinary retention -  Chronic condition requiring a foley catheter, changed monthly by a urologist. -Continue current management  8. Prediabetes Lab Results  Component Value Date   HGBA1C 5.7 (H) 04/02/2023   -  diet-controlled    General Health Maintenance -Follow-up in three months, unless otherwise changes in health status.      Labs/tests ordered:  EKG, BMP, CBC    Marke Goodwyn Medina-Vargas, NP

## 2023-07-10 LAB — BASIC METABOLIC PANEL WITH GFR
BUN: 24 mg/dL (ref 7–25)
CO2: 29 mmol/L (ref 20–32)
Calcium: 9.7 mg/dL (ref 8.6–10.3)
Chloride: 101 mmol/L (ref 98–110)
Creat: 1.1 mg/dL (ref 0.70–1.35)
Glucose, Bld: 123 mg/dL (ref 65–139)
Potassium: 3.7 mmol/L (ref 3.5–5.3)
Sodium: 141 mmol/L (ref 135–146)
eGFR: 75 mL/min/{1.73_m2} (ref >=60)

## 2023-07-10 LAB — CBC WITH DIFFERENTIAL/PLATELET
Absolute Lymphocytes: 1173 {cells}/uL (ref 850–3900)
Absolute Monocytes: 1162 {cells}/uL — ABNORMAL HIGH (ref 200–950)
Basophils Absolute: 35 {cells}/uL (ref 0–200)
Basophils Relative: 0.3 %
Eosinophils Absolute: 81 {cells}/uL (ref 15–500)
Eosinophils Relative: 0.7 %
HCT: 42.1 % (ref 38.5–50.0)
Hemoglobin: 13.9 g/dL (ref 13.2–17.1)
MCH: 27.2 pg (ref 27.0–33.0)
MCHC: 33 g/dL (ref 32.0–36.0)
MCV: 82.4 fL (ref 80.0–100.0)
MPV: 11.7 fL (ref 7.5–12.5)
Monocytes Relative: 10.1 %
Neutro Abs: 9051 {cells}/uL — ABNORMAL HIGH (ref 1500–7800)
Neutrophils Relative %: 78.7 %
Platelets: 208 10*3/uL (ref 140–400)
RBC: 5.11 10*6/uL (ref 4.20–5.80)
RDW: 11.9 % (ref 11.0–15.0)
Total Lymphocyte: 10.2 %
WBC: 11.5 10*3/uL — ABNORMAL HIGH (ref 3.8–10.8)

## 2023-07-11 ENCOUNTER — Telehealth: Payer: Self-pay

## 2023-07-11 NOTE — Telephone Encounter (Signed)
 Form received via fax, demographic information added and placed in Medina-Vargas, Monina C, NP review and sign folder with last OV note. Once completed from to be processed via fax by the administrative staff

## 2023-07-13 NOTE — Progress Notes (Signed)
-    wbc 11.5, slightly elevated, normal 3.8 - 10.8, do you have fever or chills? -  I will put in order for repeat CBC in a week, please let us when you will be coming for the lab. -  electrolytes normal -  kidney function stable

## 2023-07-16 NOTE — Telephone Encounter (Signed)
 Noted.

## 2023-07-16 NOTE — Telephone Encounter (Signed)
 Forms completed and given to Alondra @ front desk.

## 2023-07-18 ENCOUNTER — Other Ambulatory Visit: Payer: Self-pay

## 2023-07-18 DIAGNOSIS — R079 Chest pain, unspecified: Secondary | ICD-10-CM

## 2023-07-19 ENCOUNTER — Other Ambulatory Visit

## 2023-07-19 LAB — CBC WITH DIFFERENTIAL/PLATELET
Absolute Lymphocytes: 1992 {cells}/uL (ref 850–3900)
Absolute Monocytes: 656 {cells}/uL (ref 200–950)
Basophils Absolute: 64 {cells}/uL (ref 0–200)
Basophils Relative: 0.8 %
Eosinophils Absolute: 288 {cells}/uL (ref 15–500)
Eosinophils Relative: 3.6 %
HCT: 41.5 % (ref 38.5–50.0)
Hemoglobin: 13.4 g/dL (ref 13.2–17.1)
MCH: 26.5 pg — ABNORMAL LOW (ref 27.0–33.0)
MCHC: 32.3 g/dL (ref 32.0–36.0)
MCV: 82 fL (ref 80.0–100.0)
MPV: 11.1 fL (ref 7.5–12.5)
Monocytes Relative: 8.2 %
Neutro Abs: 5000 {cells}/uL (ref 1500–7800)
Neutrophils Relative %: 62.5 %
Platelets: 376 10*3/uL (ref 140–400)
RBC: 5.06 10*6/uL (ref 4.20–5.80)
RDW: 12.2 % (ref 11.0–15.0)
Total Lymphocyte: 24.9 %
WBC: 8 10*3/uL (ref 3.8–10.8)

## 2023-07-27 ENCOUNTER — Other Ambulatory Visit: Payer: Self-pay | Admitting: Neurology

## 2023-07-29 NOTE — Progress Notes (Signed)
-  WBC now normal -No anemia

## 2023-07-31 ENCOUNTER — Other Ambulatory Visit: Payer: Self-pay | Admitting: Neurology

## 2023-08-25 ENCOUNTER — Other Ambulatory Visit: Payer: Self-pay | Admitting: Neurology

## 2023-08-28 ENCOUNTER — Other Ambulatory Visit: Payer: Self-pay | Admitting: Neurology

## 2023-09-15 ENCOUNTER — Other Ambulatory Visit: Payer: Self-pay | Admitting: Neurology

## 2023-10-10 ENCOUNTER — Ambulatory Visit: Admitting: Family

## 2023-10-11 ENCOUNTER — Other Ambulatory Visit: Payer: Self-pay | Admitting: Neurology

## 2023-10-12 ENCOUNTER — Ambulatory Visit: Admitting: Adult Health

## 2023-10-12 ENCOUNTER — Encounter: Payer: Self-pay | Admitting: Adult Health

## 2023-10-12 VITALS — BP 130/80 | HR 85 | Temp 98.4°F | Ht 74.0 in | Wt 200.0 lb

## 2023-10-12 DIAGNOSIS — N319 Neuromuscular dysfunction of bladder, unspecified: Secondary | ICD-10-CM

## 2023-10-12 DIAGNOSIS — M792 Neuralgia and neuritis, unspecified: Secondary | ICD-10-CM | POA: Diagnosis not present

## 2023-10-12 DIAGNOSIS — I10 Essential (primary) hypertension: Secondary | ICD-10-CM

## 2023-10-12 DIAGNOSIS — G35 Multiple sclerosis: Secondary | ICD-10-CM

## 2023-10-12 DIAGNOSIS — I82512 Chronic embolism and thrombosis of left femoral vein: Secondary | ICD-10-CM

## 2023-10-12 DIAGNOSIS — E782 Mixed hyperlipidemia: Secondary | ICD-10-CM

## 2023-10-12 DIAGNOSIS — G40909 Epilepsy, unspecified, not intractable, without status epilepticus: Secondary | ICD-10-CM

## 2023-10-12 DIAGNOSIS — R7303 Prediabetes: Secondary | ICD-10-CM

## 2023-10-12 MED ORDER — BACLOFEN 20 MG PO TABS: ORAL_TABLET | ORAL | 3 refills | Status: DC

## 2023-10-12 MED ORDER — DULOXETINE HCL 60 MG PO CPEP: 60.0000 mg | ORAL_CAPSULE | Freq: Every day | ORAL | 0 refills | Status: DC

## 2023-10-12 NOTE — Progress Notes (Signed)
 [facility] clinic  Provider:  Jereld Serum DNP  Code Status:  Full Code  Goals of Care:     03/19/2023    3:08 PM  Advanced Directives  Does Patient Have a Medical Advance Directive? Yes  Type of Advance Directive Living will  Does patient want to make changes to medical advance directive? No - Patient declined     Chief Complaint  Patient presents with   Follow-up    Patient has concerns about rash on is back.   Discussed the use of AI scribe software for clinical note transcription with the patient, who gave verbal consent to proceed.  HPI: Patient is a 63 y.o. male seen today for a 41-month follow up of chronic medical issues.  He is seeking refills for duloxetine  and baclofen . He takes Cymbalta  (duloxetine ) 60 mg daily for neuropathy, experiencing numbness without pain. Baclofen  is taken for multiple sclerosis at a dose of 20 mg, one tablet in the morning, one at noon, and two at night.  His multiple sclerosis has been stable. He experiences weakness in the left leg, requiring assistance to move it, while the right leg is functional. Baclofen  aids in muscle relaxation, allowing the left leg to bend more easily. He has a neurogenic bladder due to MS and uses a Foley catheter attached to a urine bag, with clear yellow urine and no blood. The catheter is managed by a urologist.  He has a chronic DVT in the left lower extremity, causing swelling. He takes Eliquis  5 mg twice a day for this condition.  He has a history of seizures, with the last episode occurring over three months ago. He is on Keppra  500 mg twice a day for seizure control.  His hypertension is managed with losartan  HCTZ 50/12.5 mg daily, with a home blood pressure reading of 130/80.  He is prediabetic with a last A1c of 5.7 in December 2024 and is not on any medication for this condition.  He has elevated cholesterol levels with a total cholesterol of 241, triglycerides of 114, and LDL of 150, and is not  currently taking any medication for cholesterol.  He experiences seasonal wheezing, particularly in spring, but denies shortness of breath or fever. He uses Xyzal as needed for allergies.  He has a history of a sore on the left foot, which has healed, and is scheduled to see  orthopedics for follow-up.     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    Past Surgical History:  Procedure Laterality Date   COLONOSCOPY WITH PROPOFOL  N/A 03/14/2022   Procedure: COLONOSCOPY WITH PROPOFOL ;  Surgeon: Elicia Claw, MD;  Location: [facility];  Service: Gastroenterology;  Laterality: N/A;   none      Allergies  Allergen Reactions   Peanut-Containing Drug Products Shortness Of Breath   Zanaflex  [Tizanidine  Hcl] Shortness Of Breath   Interferon Beta-1a Other (See Comments)    Outpatient Encounter Medications as of 10/12/2023  Medication Sig   apixaban  (ELIQUIS ) 5 MG TABS tablet Take 1 tablet (5 mg total) by mouth 2 (two) times daily. Start taking after completion of starter pack.   Cholecalciferol (VITAMIN D-3) 125 MCG (5000 UT) TABS Take 5,000 Units by mouth daily.   levETIRAcetam  (KEPPRA ) 500 MG tablet Take 1 tablet by mouth twice daily  losartan -hydrochlorothiazide  (HYZAAR) 50-12.5 MG tablet Take 1 tablet by mouth daily.   [DISCONTINUED] baclofen  (LIORESAL ) 20 MG tablet TAKE 1 TABLET BY MOUTH IN THE MORNING AND 1 TABLET AT NOON AND 2 TABLETS NIGHTLY   [DISCONTINUED] DULoxetine  (CYMBALTA ) 60 MG capsule Take 1 capsule by mouth once daily   baclofen  (LIORESAL ) 20 MG tablet TAKE 1 TABLET BY MOUTH IN THE MORNING AND 1 TABLET AT NOON AND 2 TABLETS NIGHTLY   DULoxetine  (CYMBALTA ) 60 MG capsule Take 1 capsule (60 mg total) by mouth daily.   ELIQUIS  5 MG TABS tablet TAKE 1 TABLET BY MOUTH TWICE DAILY START  TAKING  AFTER   COMPLETION  OF  STARTER  PACK (Patient not taking: Reported on 10/12/2023)   No facility-administered encounter medications on file as of 10/12/2023.    Review of Systems:  Review of Systems  Constitutional:  Negative for activity change, appetite change and fever.  HENT:  Negative for sore throat.   Eyes: Negative.   Cardiovascular:  Negative for chest pain and leg swelling.  Gastrointestinal:  Negative for abdominal distention, diarrhea and vomiting.  Genitourinary:  Negative for dysuria, frequency and urgency.  Skin:  Negative for color change.  Neurological:  Positive for weakness. Negative for dizziness and headaches.  Psychiatric/Behavioral:  Negative for behavioral problems and sleep disturbance. The patient is not nervous/anxious.     Health Maintenance  Topic Date Due   Zoster Vaccines- Shingrix (1 of 2) Never done   Fecal DNA (Cologuard)  03/09/2021   COVID-19 Vaccine (1 - 2024-25 season) Never done   INFLUENZA VACCINE  11/30/2023   DTaP/Tdap/Td (2 - Td or Tdap) 08/24/2027   Hepatitis C Screening  Completed   HIV Screening  Completed   HPV VACCINES  Aged Out   Meningococcal B Vaccine  Aged Out    Physical Exam: Vitals:   10/12/23 1332  BP: 130/80  Pulse: 85  Temp: 98.4 F (36.9 C)  SpO2: 98%  Weight: 200 lb (90.7 kg)  Height: 6' 2 (1.88 m)   Body mass index is 25.68 kg/m. Physical Exam Constitutional:      Appearance: Normal appearance.  HENT:     Head: Normocephalic and atraumatic.     Mouth/Throat:     Mouth: Mucous membranes are moist.   Eyes:     Conjunctiva/sclera: Conjunctivae normal.    Cardiovascular:     Rate and Rhythm: Normal rate and regular rhythm.     Pulses: Normal pulses.     Heart sounds: Normal heart sounds.  Pulmonary:     Effort: Pulmonary effort is normal.     Breath sounds: Normal breath sounds. No wheezing.     Comments: Wheeze on bilateral lung fields Abdominal:     General: Bowel sounds are normal.     Palpations:  Abdomen is soft.  Genitourinary:    Comments: Foley catheter  Musculoskeletal:        General: No swelling.     Cervical back: Normal range of motion.     Comments: BLE weakness, Left weaker than Right   Skin:    General: Skin is warm and dry.   Neurological:     General: No focal deficit present.     Mental Status: He is alert and oriented to person, place, and time.     Motor: Weakness present.     Comments: BLE weakness, Left weaker than Right  Psychiatric:        Mood and Affect: Mood normal.  Behavior: Behavior normal.        Thought Content: Thought content normal.        Judgment: Judgment normal.     Labs reviewed: Basic Metabolic Panel: Recent Labs    04/02/23 1527 07/09/23 1546  NA 144 141  K 4.0 3.7  CL 103 101  CO2 33* 29  GLUCOSE 92 123  BUN 18 24  CREATININE 0.92 1.10  CALCIUM 10.5* 9.7   Liver Function Tests: Recent Labs    04/02/23 1527  AST 19  ALT 18  BILITOT 0.6  PROT 7.7   No results for input(s): LIPASE, AMYLASE in the last 8760 hours. No results for input(s): AMMONIA in the last 8760 hours. CBC: Recent Labs    04/02/23 1527 07/09/23 1546 07/19/23 1337  WBC 7.5 11.5* 8.0  NEUTROABS 4,883 9,051* 5,000  HGB 14.0 13.9 13.4  HCT 44.2 42.1 41.5  MCV 83.2 82.4 82.0  PLT 262 208 376   Lipid Panel: Recent Labs    04/02/23 1527  CHOL 241*  HDL 68  LDLCALC 150*  TRIG 114  CHOLHDL 3.5   Lab Results  Component Value Date   HGBA1C 5.7 (H) 04/02/2023    Procedures since last visit: No results found.  Assessment/Plan  1. Multiple sclerosis (HCC) (Primary) -  Continue baclofen  20 mg: 1 tab in the morning, 1 tab at noon, 2 tabs at night. - Follow up with neurologist at the end of the month. - baclofen  (LIORESAL ) 20 MG tablet; TAKE 1 TABLET BY MOUTH IN THE MORNING AND 1 TABLET AT NOON AND 2 TABLETS NIGHTLY  Dispense: 120 tablet; Refill: 3  2. Seizure disorder (HCC) -  No seizures in the last three months, managed  with Keppra . - Continue Keppra  500 mg twice a day.  3. Chronic deep vein thrombosis (DVT) of femoral vein of left lower extremity (HCC) -  Chronic DVT in left lower extremity causing swelling, managed with Eliquis . Reports occasional missed doses but currently adherent. - Continue Eliquis  5 mg twice a day. - Advise elevation of the left leg at night to reduce swelling.  4. Neuropathic pain -  Neuropathy well-controlled with Cymbalta . Reports no pain, only numbness. - Continue Cymbalta  60 mg daily. - DULoxetine  (CYMBALTA ) 60 MG capsule; Take 1 capsule (60 mg total) by mouth daily.  Dispense: 30 capsule; Refill: 0  5. Primary hypertension -  Hypertension well-controlled with losartan -HCTZ. - Continue losartan -HCTZ 50/12.5 mg daily.  6. Prediabetes -  A1c is 5.7, indicating prediabetes. Not on medication.  7. Mixed hyperlipidemia -  Elevated cholesterol levels with LDL of 150 and total cholesterol of 241. Hesitant to start statin therapy due to potential side effects. - Monitor cholesterol levels and consider statin therapy if levels remain elevated.  8. Neurogenic bladder -  foley catheter, changed monthly by urology    General Health Maintenance Does not smoke or drink alcohol. Has a support system with an aide for daily activities. Uses public transportation for appointments and errands.  Follow-up - Schedule follow-up appointment in one month. - Instruct him to fast before the next visit for lab work. - Send medication refills to the pharmacy.       Labs/tests ordered:  None   Return in about 4 weeks (around 11/09/2023).  Terique Kawabata Medina-Vargas, NP

## 2023-10-17 ENCOUNTER — Ambulatory Visit: Admitting: Family

## 2023-10-17 ENCOUNTER — Encounter: Payer: Self-pay | Admitting: Family

## 2023-10-17 DIAGNOSIS — B353 Tinea pedis: Secondary | ICD-10-CM | POA: Diagnosis not present

## 2023-10-17 MED ORDER — TERBINAFINE HCL 1 % EX CREA
1.0000 | TOPICAL_CREAM | Freq: Two times a day (BID) | CUTANEOUS | 0 refills | Status: DC
Start: 2023-10-17 — End: 2023-11-09

## 2023-10-17 NOTE — Progress Notes (Unsigned)
 Office Visit Note   Patient: Randy Shaw           Date of Birth: Jun 15, 1960           MRN: [id number] Visit Date: 10/17/2023              Requested by: Duncan Gibson, NP [address] [place],  [place] [address] PCP: Duncan Gibson, NP  Chief Complaint  Patient presents with  . Left Foot - Pain      HPI: ***  Assessment & Plan: Visit Diagnoses: No diagnosis found.  Plan: ***  Follow-Up Instructions: No follow-ups on file.   Ortho Exam  Patient is alert, oriented, no adenopathy, well-dressed, normal affect, normal respiratory effort. ***    Imaging: No results found. No images are attached to the encounter.  Labs: Lab Results  Component Value Date   HGBA1C 5.7 (H) 04/02/2023   HGBA1C 5.7 (H) 03/10/2021   HGBA1C 5.4 10/27/2019   CRP 0.8 03/04/2021   REPTSTATUS 08/09/2014 FINAL 08/08/2014   CULT NO GROWTH Performed at [facility]  08/08/2014     Lab Results  Component Value Date   ALBUMIN 4.2 06/27/2022   ALBUMIN 3.9 12/22/2021   ALBUMIN 3.1 (L) 03/04/2021    Lab Results  Component Value Date   MG 1.9 03/04/2021   Lab Results  Component Value Date   VD25OH 14.1 (L) 03/10/2021    No results found for: PREALBUMIN    Latest Ref Rng & Units 07/19/2023    1:37 PM 07/09/2023    3:46 PM 04/02/2023    3:27 PM  CBC EXTENDED  WBC 3.8 - 10.8 Thousand/uL 8.0  11.5  7.5   RBC 4.20 - 5.80 Million/uL 5.06  5.11  5.31   Hemoglobin 13.2 - 17.1 g/dL 29.5  62.1  30.8   HCT 38.5 - 50.0 % 41.5  42.1  44.2   Platelets 140 - 400 Thousand/uL 376  208  262   NEUT# 1,500 - 7,800 cells/uL 5,000  9,051  4,883      There is no height or weight on file to calculate BMI.  Orders:  No orders of the defined types were placed in this encounter.  No orders of the defined types were placed in this encounter.    Procedures: No procedures performed  Clinical Data: No additional findings.  ROS:  All other systems negative,  except as noted in the HPI. Review of Systems  Objective: Vital Signs: There were no vitals taken for this visit.  Specialty Comments:  No specialty comments available.  PMFS History: Patient Active Problem List   Diagnosis Date Noted  . Deep vein thrombosis (DVT) of femoral vein of left lower extremity (HCC) 11/06/2022  . Fall 06/23/2021  . Neuropathic pain 06/23/2021  . Urinary urgency 06/23/2021  . Aneurysm of ascending aorta without rupture (HCC) 04/06/2021  . Seizure disorder (HCC) 03/10/2021  . Urinary incontinence 03/10/2021  . Other fatigue 03/10/2021  . Urinary retention 03/10/2021  . New onset seizure (HCC) 03/03/2021  . COVID   . 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  . Abnormality of gait 04/22/2013  . HTN (hypertension) 06/29/2012  . Multiple sclerosis (HCC) 06/29/2012  . Benign prostatic hyperplasia with urinary obstruction 06/29/2012   Past Medical History:  Diagnosis Date  . Abnormality of gait 04/22/2013  . Asthma   . BPH (benign prostatic hyperplasia)   . Hypertension   . Multiple sclerosis (HCC)   . Quadriplegia and quadriparesis (  HCC) 03/08/2016    Family History  Problem Relation Age of Onset  . Cancer Mother        breast  . Stroke Father   . Cancer - Colon Father   . Multiple sclerosis Neg Hx     Past Surgical History:  Procedure Laterality Date  . COLONOSCOPY WITH PROPOFOL  N/A 03/14/2022   Procedure: COLONOSCOPY WITH PROPOFOL ;  Surgeon: Felecia Hopper, MD;  Location: [facility];  Service: Gastroenterology;  Laterality: N/A;  . none     Social History   Occupational History  . Occupation: disabled/retired 1999-[place]    Comment: Event organiser  Tobacco Use  . Smoking status: Never  . Smokeless tobacco: Never  Substance and Sexual Activity  . Alcohol use: Not Currently    Alcohol/week: 0.0 standard drinks of alcohol    Comment: occasionally wine cooler  . Drug use: No  . Sexual activity: Never

## 2023-10-19 ENCOUNTER — Encounter: Payer: Self-pay | Admitting: Family

## 2023-10-25 ENCOUNTER — Ambulatory Visit: Payer: Medicaid Other | Admitting: Adult Health

## 2023-10-25 ENCOUNTER — Encounter: Payer: Self-pay | Admitting: Adult Health

## 2023-11-09 ENCOUNTER — Ambulatory Visit: Admitting: Adult Health

## 2023-11-09 ENCOUNTER — Encounter: Payer: Self-pay | Admitting: Adult Health

## 2023-11-09 ENCOUNTER — Other Ambulatory Visit: Payer: Self-pay | Admitting: Adult Health

## 2023-11-09 VITALS — BP 124/78 | HR 81 | Temp 98.1°F | Resp 8 | Ht 74.0 in

## 2023-11-09 DIAGNOSIS — G35 Multiple sclerosis: Secondary | ICD-10-CM | POA: Diagnosis not present

## 2023-11-09 DIAGNOSIS — M792 Neuralgia and neuritis, unspecified: Secondary | ICD-10-CM

## 2023-11-09 DIAGNOSIS — R569 Unspecified convulsions: Secondary | ICD-10-CM

## 2023-11-09 DIAGNOSIS — I82512 Chronic embolism and thrombosis of left femoral vein: Secondary | ICD-10-CM | POA: Diagnosis not present

## 2023-11-09 DIAGNOSIS — G629 Polyneuropathy, unspecified: Secondary | ICD-10-CM | POA: Diagnosis not present

## 2023-11-09 DIAGNOSIS — B353 Tinea pedis: Secondary | ICD-10-CM

## 2023-11-09 DIAGNOSIS — R7303 Prediabetes: Secondary | ICD-10-CM

## 2023-11-09 DIAGNOSIS — E782 Mixed hyperlipidemia: Secondary | ICD-10-CM

## 2023-11-09 DIAGNOSIS — H6123 Impacted cerumen, bilateral: Secondary | ICD-10-CM

## 2023-11-09 DIAGNOSIS — N319 Neuromuscular dysfunction of bladder, unspecified: Secondary | ICD-10-CM

## 2023-11-09 DIAGNOSIS — I10 Essential (primary) hypertension: Secondary | ICD-10-CM

## 2023-11-09 DIAGNOSIS — B351 Tinea unguium: Secondary | ICD-10-CM

## 2023-11-09 MED ORDER — DEBROX 6.5 % OT SOLN: 5.0000 [drp] | Freq: Two times a day (BID) | OTIC | 0 refills | Status: AC

## 2023-11-09 MED ORDER — TERBINAFINE HCL 1 % EX CREA
1.0000 | TOPICAL_CREAM | Freq: Two times a day (BID) | CUTANEOUS | 0 refills | Status: DC
Start: 2023-11-09 — End: 2024-02-13

## 2023-11-09 NOTE — Progress Notes (Signed)
 [facility] clinic  Provider:  Jereld Serum DNP  Code Status:  Full Code  Goals of Care:     03/19/2023    3:08 PM  Advanced Directives  Does Patient Have a Medical Advance Directive? Yes  Type of Advance Directive Living will  Does patient want to make changes to medical advance directive? No - Patient declined     Chief Complaint  Patient presents with   Medical Management of Chronic Issues    4 week follow up //    Discussed the use of AI scribe software for clinical note transcription with the patient, who gave verbal consent to proceed.   HPI: Patient is a 63 y.o. male seen today for a 4-week follow up of chronic medical issues.  He has chronic deep vein thrombosis (DVT) of the left lower extremity, which remains swollen with 2+ edema. He is currently taking Eliquis  5 mg BID for this condition.   He has multiple sclerosis, which affects his mobility. He is unable to move his left leg but can move the right leg. He uses a motorized wheelchair for mobility. He takes Baclofen  20 mg, one tablet in the morning, one at noon, and two at night for muscle spasms.  His last seizure occurred a year ago, and currently taking Keppra  500 mg twice a day for seizure management.  No neuropathic pain but numbness on the left side, including the left arm and leg. He takes Cymbalta  60 mg daily for numbness.  He is on Losartan  Hydrochlorothiazide  50/12.5 mg once a day for hypertension. Today, BP 124/78.  He has mixed hyperlipidemia with a cholesterol level of 241 and LDL of 150 from seven months ago. He is not on statin.  His most recent A1c was 5.7, 04/02/23.  He was treated for tinea pedis of the left foot but have not used the prescribed cream. 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  He experienced a pounding sensation in his ears when lying down at night, described as 'like water behind my ear.' They have bilateral cerumen  impaction and will be using Debrox drops for this.  He experiences wheezing after consuming peanuts, indicating a possible allergy to peanuts.    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    Past Surgical History:  Procedure Laterality Date   COLONOSCOPY WITH PROPOFOL  N/A 03/14/2022   Procedure: COLONOSCOPY WITH PROPOFOL ;  Surgeon: Elicia Claw, MD;  Location: [facility];  Service: Gastroenterology;  Laterality: N/A;   none      Allergies  Allergen Reactions   Peanut-Containing Drug Products Shortness Of Breath   Zanaflex  [Tizanidine  Hcl] Shortness Of Breath   Interferon Beta-1a Other (See Comments)    Outpatient Encounter Medications as of 11/09/2023  Medication Sig   baclofen  (LIORESAL ) 20 MG tablet TAKE 1 TABLET BY MOUTH IN THE MORNING AND 1 TABLET AT NOON AND 2 TABLETS NIGHTLY   Cholecalciferol (VITAMIN D-3) 125 MCG (5000 UT) TABS Take 5,000 Units by mouth daily.   DULoxetine  (CYMBALTA ) 60 MG capsule Take 1 capsule by mouth once daily   levETIRAcetam  (KEPPRA ) 500 MG tablet Take 1 tablet by mouth twice daily   losartan -hydrochlorothiazide  (HYZAAR) 50-12.5 MG tablet Take 1 tablet by mouth daily.   apixaban  (  ELIQUIS ) 5 MG TABS tablet Take 1 tablet (5 mg total) by mouth 2 (two) times daily. Start taking after completion of starter pack. (Patient not taking: Reported on 11/09/2023)   terbinafine  (LAMISIL  AT) 1 % cream Apply 1 Application topically 2 (two) times daily.   [DISCONTINUED] DULoxetine  (CYMBALTA ) 60 MG capsule Take 1 capsule (60 mg total) by mouth daily.   [DISCONTINUED] ELIQUIS  5 MG TABS tablet TAKE 1 TABLET BY MOUTH TWICE DAILY START  TAKING  AFTER  COMPLETION  OF  STARTER  PACK (Patient not taking: Reported on 11/09/2023)   [DISCONTINUED] terbinafine  (LAMISIL  AT) 1 % cream Apply 1 Application  topically 2 (two) times daily. (Patient not taking: Reported on 11/09/2023)   No facility-administered encounter medications on file as of 11/09/2023.    Review of Systems:  Review of Systems  Health Maintenance  Topic Date Due   Fecal DNA (Cologuard)  03/09/2021   INFLUENZA VACCINE  11/30/2023   Hepatitis C Screening  Completed   HIV Screening  Completed   Hepatitis B Vaccines  Aged Out   HPV VACCINES  Aged Out   Meningococcal B Vaccine  Aged Out   DTaP/Tdap/Td  Discontinued   COVID-19 Vaccine  Discontinued   Zoster Vaccines- Shingrix  Discontinued    Physical Exam: Vitals:   11/09/23 1321 11/09/23 1352  BP: 124/78   Pulse: (!) 52 81  Resp: (!) 8   Temp: 98.1 F (36.7 C)   SpO2: 91%   Height: 6' 2 (1.88 m)    Body mass index is 25.68 kg/m. Physical Exam Constitutional:      Appearance: Normal appearance.  HENT:     Head: Normocephalic and atraumatic.     Right Ear: There is impacted cerumen.     Left Ear: There is impacted cerumen.     Ears:     Comments: Bilateral ears with cerumen impaction    Mouth/Throat:     Mouth: Mucous membranes are moist.  Eyes:     Conjunctiva/sclera: Conjunctivae normal.  Cardiovascular:     Rate and Rhythm: Normal rate and regular rhythm.     Pulses: Normal pulses.     Heart sounds: Normal heart sounds.  Pulmonary:     Effort: Pulmonary effort is normal.     Breath sounds: Normal breath sounds.  Abdominal:     General: Bowel sounds are normal.     Palpations: Abdomen is soft.  Musculoskeletal:        General: No swelling. Normal range of motion.     Cervical back: Normal range of motion.     Left lower leg: Edema present.     Comments: LLE 2+ edema  Skin:    General: Skin is warm and dry.     Comments: Bilateral feet toenails are thick, whitish  Neurological:     General: No focal deficit present.     Mental Status: He is alert and oriented to person, place, and time.     Comments: Not able to move LLE  Psychiatric:         Mood and Affect: Mood normal.        Behavior: Behavior normal.        Thought Content: Thought content normal.        Judgment: Judgment normal.     Labs reviewed: Basic Metabolic Panel: Recent Labs    04/02/23 1527 07/09/23 1546  NA 144 141  K 4.0 3.7  CL 103 101  CO2 33* 29  GLUCOSE  92 123  BUN 18 24  CREATININE 0.92 1.10  CALCIUM 10.5* 9.7   Liver Function Tests: Recent Labs    04/02/23 1527  AST 19  ALT 18  BILITOT 0.6  PROT 7.7   No results for input(s): LIPASE, AMYLASE in the last 8760 hours. No results for input(s): AMMONIA in the last 8760 hours. CBC: Recent Labs    04/02/23 1527 07/09/23 1546 07/19/23 1337  WBC 7.5 11.5* 8.0  NEUTROABS 4,883 9,051* 5,000  HGB 14.0 13.9 13.4  HCT 44.2 42.1 41.5  MCV 83.2 82.4 82.0  PLT 262 208 376   Lipid Panel: Recent Labs    04/02/23 1527  CHOL 241*  HDL 68  LDLCALC 150*  TRIG 114  CHOLHDL 3.5   Lab Results  Component Value Date   HGBA1C 5.7 (H) 04/02/2023    Procedures since last visit: No results found.  Assessment/Plan  1. Chronic deep vein thrombosis (DVT) of femoral vein of left lower extremity (HCC) (Primary) -  Chronic DVT with 2+ edema. Emphasized leg elevation to reduce edema. - Continue Eliquis  5 mg twice daily. - Advised to elevate legs at night using 2-3 pillows. - CBC with Differential/Platelets  2. Multiple sclerosis (HCC) -  Multiple sclerosis affecting mobility, particularly in the left leg. Baclofen  helps with muscle spasms. - Continue baclofen  20 mg, one tab in the morning, one tab at noon, and two tabs at night.  3. Seizure (HCC) -  Seizures controlled with Keppra . Last episode a year ago. - Continue Keppra  500 mg twice daily.  4. Neuropathy -  Numbness in the left side, likely related to multiple sclerosis. Cymbalta  used for numbness. - Continue Cymbalta  60 mg daily.  5. Primary hypertension -  Blood pressure well-controlled on current medication.  -  Continue losartan  hydrochlorothiazide  50/12.5 mg daily. - Basic metabolic panel  6. Mixed hyperlipidemia -  Previously elevated cholesterol levels. Not on statins due to multiple sclerosis concerns. Plan to reassess lipid levels. - Order lipid panel. - Discuss potential treatment options based on lipid panel results. - Lipid panel  7. Neurogenic bladder -  has foley catheter, changed monthly by urology  8. Tinea pedis of left foot -  Previously treated for tinea pedis. Did not pick up prescribed cream. Podiatry consult needed for foot care. - Refer to podiatry for foot care and evaluation of potential onychomycosis. - Reorder Lamisil  cream for tinea pedis. - terbinafine  (LAMISIL  AT) 1 % cream; Apply 1 Application topically 2 (two) times daily.  Dispense: 42 g; Refill: 0  9. Prediabetes -  A1c at 5.7%, indicating prediabetes. Dietary modifications ongoing. - Order A1c test. - Continue dietary modifications. - Hemoglobin A1c  10. Onychomycosis -  bilateral feet toenails thick and whitish - Ambulatory referral to Podiatry  11. Cerumen debris on tympanic membrane of both ears -  Bilateral cerumen impaction contributing to ear fullness and hearing issues. - Advise to use Debrox drops, 5 drops in each ear twice daily for 4 days. - Schedule follow-up appointment for ear irrigation on the fifth day. - carbamide peroxide (DEBROX) 6.5 % OTIC solution; Place 5 drops into both ears 2 (two) times daily for 4 days. Then irrigate on 5th day  Dispense: 2 mL; Refill: 0     Labs/tests ordered: Lipid panel, CBC, BMP and A1c   Return in about 3 months (around 02/09/2024).  Remonia Otte Medina-Vargas, NP

## 2023-11-10 LAB — CBC WITH DIFFERENTIAL/PLATELET
Absolute Lymphocytes: 1695 {cells}/uL (ref 850–3900)
Absolute Monocytes: 509 {cells}/uL (ref 200–950)
Basophils Absolute: 47 {cells}/uL (ref 0–200)
Basophils Relative: 0.7 %
Eosinophils Absolute: 348 {cells}/uL (ref 15–500)
Eosinophils Relative: 5.2 %
HCT: 43.4 % (ref 38.5–50.0)
Hemoglobin: 13.9 g/dL (ref 13.2–17.1)
MCH: 26.9 pg — ABNORMAL LOW (ref 27.0–33.0)
MCHC: 32 g/dL (ref 32.0–36.0)
MCV: 84.1 fL (ref 80.0–100.0)
MPV: 11.4 fL (ref 7.5–12.5)
Monocytes Relative: 7.6 %
Neutro Abs: 4100 {cells}/uL (ref 1500–7800)
Neutrophils Relative %: 61.2 %
Platelets: 229 Thousand/uL (ref 140–400)
RBC: 5.16 Million/uL (ref 4.20–5.80)
RDW: 12.9 % (ref 11.0–15.0)
Total Lymphocyte: 25.3 %
WBC: 6.7 Thousand/uL (ref 3.8–10.8)

## 2023-11-10 LAB — HEMOGLOBIN A1C
Hgb A1c MFr Bld: 5.9 % — ABNORMAL HIGH (ref <5.7)
Mean Plasma Glucose: 123 mg/dL
eAG (mmol/L): 6.8 mmol/L

## 2023-11-10 LAB — BASIC METABOLIC PANEL WITH GFR
BUN: 18 mg/dL (ref 7–25)
CO2: 28 mmol/L (ref 20–32)
Calcium: 9.8 mg/dL (ref 8.6–10.3)
Chloride: 105 mmol/L (ref 98–110)
Creat: 0.88 mg/dL (ref 0.70–1.35)
Glucose, Bld: 94 mg/dL (ref 65–99)
Potassium: 3.6 mmol/L (ref 3.5–5.3)
Sodium: 143 mmol/L (ref 135–146)
eGFR: 97 mL/min/1.73m2 (ref >=60)

## 2023-11-10 LAB — LIPID PANEL
Cholesterol: 226 mg/dL — ABNORMAL HIGH (ref <200)
HDL: 74 mg/dL (ref >=40)
LDL Cholesterol (Calc): 134 mg/dL — ABNORMAL HIGH
Non-HDL Cholesterol (Calc): 152 mg/dL — ABNORMAL HIGH (ref <130)
Total CHOL/HDL Ratio: 3.1 (calc) (ref <5.0)
Triglycerides: 83 mg/dL (ref <150)

## 2023-11-14 ENCOUNTER — Ambulatory Visit: Payer: Self-pay | Admitting: Adult Health

## 2023-11-14 ENCOUNTER — Encounter: Payer: Self-pay | Admitting: Physician Assistant

## 2023-11-14 ENCOUNTER — Ambulatory Visit (INDEPENDENT_AMBULATORY_CARE_PROVIDER_SITE_OTHER): Admitting: Physician Assistant

## 2023-11-14 DIAGNOSIS — B353 Tinea pedis: Secondary | ICD-10-CM | POA: Diagnosis not present

## 2023-11-14 NOTE — Progress Notes (Signed)
-     A1c 5.9, up from 5.7, ranging as prediabetic -   No anemia -   Cholesterol 226, down from 241 -   LDL 134, down from 150 -   Triglycerides normal,   continue low-fat diet and exercise (while sitting down on chair )for at least 150 minutes/week, may do range of motion exercises on extremities -   Electrolytes normal

## 2023-11-14 NOTE — Progress Notes (Signed)
 Office Visit Note   Patient: Randy Shaw           Date of Birth: [birth year]           MRN: [id number] Visit Date: 11/14/2023              Requested by: Phyllis Jereld BROCKS, NP [address] [place],  [place] [address] PCP: Phyllis Jereld BROCKS, NP  Chief Complaint  Patient presents with   Left Foot - Follow-up      HPI: The patient is a 63 year old gentleman who presents today complaining of painful swelling to his left foot especially on the lateral side where he has noticed his skin breakdown complaint concerned about some callus skin over the lateral column some associated itching and burning patient does have some numbness at baseline.  He was seen on 10/17/23.  He was diagnosed with Tinea pedis. Recommended topical Lamisil . Plan to reevaluate in 4 weeks.    He was seen by his PCP on 11/09/23 and stated he did not feel Lamisil  and has now been referred to a DPM.  He denies concerns.   Assessment & Plan: Visit Diagnoses: No diagnosis found.  Plan: Elevation of LE's PRN for edema.  Lamisil  PRN for tinea pedis.  Follow-Up Instructions: Return if symptoms worsen or fail to improve.   Ortho Exam  Patient is alert, oriented, no adenopathy, well-dressed, normal affect, normal respiratory effort. Left lateral foot scaly skin.  No cellulitis, ischemic skin changes, drainage or erythema.  NTTP.  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      Imaging: No results found. No images are attached to the encounter.  Labs: Lab Results  Component Value Date   HGBA1C 5.9 (H) 11/09/2023   HGBA1C 5.7 (H) 04/02/2023   HGBA1C 5.7 (H) 03/10/2021   CRP 0.8 03/04/2021   REPTSTATUS 08/09/2014 FINAL 08/08/2014   CULT NO GROWTH Performed at [facility]  08/08/2014     Lab Results  Component Value Date   ALBUMIN 4.2 06/27/2022   ALBUMIN 3.9 12/22/2021   ALBUMIN 3.1 (L) 03/04/2021    Lab Results  Component Value  Date   MG 1.9 03/04/2021   Lab Results  Component Value Date   VD25OH 14.1 (L) 03/10/2021    No results found for: PREALBUMIN    Latest Ref Rng & Units 11/09/2023    1:28 PM 07/19/2023    1:37 PM 07/09/2023    3:46 PM  CBC EXTENDED  WBC 3.8 - 10.8 Thousand/uL 6.7  8.0  11.5   RBC 4.20 - 5.80 Million/uL 5.16  5.06  5.11   Hemoglobin 13.2 - 17.1 g/dL 86.0  86.5  86.0   HCT 38.5 - 50.0 % 43.4  41.5  42.1   Platelets 140 - 400 Thousand/uL 229  376  208   NEUT# 1,500 - 7,800 cells/uL 4,100  5,000  9,051      There is no height or weight on file to calculate BMI.  Orders:  No orders of the defined types were placed in this encounter.  No orders of the defined types were placed in this encounter.    Procedures: No procedures performed  Clinical Data: No additional findings.  ROS:  All other systems negative, except as noted in the HPI. Review of Systems  Objective: Vital Signs: There were no vitals taken for this visit.  Specialty Comments:  No specialty comments available.  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   Abnormality of gait 04/22/2013   HTN (hypertension) 06/29/2012   Multiple sclerosis (HCC) 06/29/2012   Benign prostatic hyperplasia with urinary obstruction 06/29/2012   Past Medical History:  Diagnosis Date   Abnormality of gait 04/22/2013   Asthma    BPH (benign prostatic hyperplasia)    Hypertension    Multiple sclerosis (HCC)    Quadriplegia and quadriparesis (HCC)  03/08/2016    Family History  Problem Relation Age of Onset   Cancer Mother        breast   Stroke Father    Cancer - Colon Father    Multiple sclerosis Neg Hx     Past Surgical History:  Procedure Laterality Date   COLONOSCOPY WITH PROPOFOL  N/A 03/14/2022   Procedure: COLONOSCOPY WITH PROPOFOL ;  Surgeon: Elicia Claw, MD;  Location: [facility];  Service: Gastroenterology;  Laterality: N/A;   none     Social History   Occupational History   Occupation: disabled/retired 1999-[employer]    Comment: Event organiser  Tobacco Use   Smoking status: Never   Smokeless tobacco: Never  Substance and Sexual Activity   Alcohol use: Not Currently    Comment: Occasionally [place] ice tea   Drug use: No   Sexual activity: Never

## 2023-11-21 ENCOUNTER — Ambulatory Visit (INDEPENDENT_AMBULATORY_CARE_PROVIDER_SITE_OTHER): Admitting: Podiatry

## 2023-11-21 DIAGNOSIS — M79675 Pain in left toe(s): Secondary | ICD-10-CM

## 2023-11-21 DIAGNOSIS — M79674 Pain in right toe(s): Secondary | ICD-10-CM | POA: Diagnosis not present

## 2023-11-21 DIAGNOSIS — B351 Tinea unguium: Secondary | ICD-10-CM

## 2023-11-21 NOTE — Progress Notes (Signed)
   Chief Complaint  Patient presents with   RFC    RFC, with really dry skin on the left lateral side. Wants to make sure it is ok, and maybe get something to help it. Not diabetic and no anti coags. He is not taking Eliquis .     SUBJECTIVE Patient nonambulatory in a wheelchair presents to office today as a reestablish new patient complaining of elongated, thickened nails that cause pain while in shoes.  Patient is unable to trim their own nails. Patient is here for further evaluation and treatment.  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    Allergies  Allergen Reactions   Peanut-Containing Drug Products Shortness Of Breath   Zanaflex  [Tizanidine  Hcl] Shortness Of Breath   Interferon Beta-1a Other (See Comments)     OBJECTIVE General Patient is awake, alert, and oriented x 3 and in no acute distress. Derm Skin is dry and supple bilateral. Negative open lesions or macerations. Remaining integument unremarkable. Nails are tender, long, thickened and dystrophic with subungual debris, consistent with onychomycosis, 1-5 bilateral. No signs of infection noted. Vasc  DP and PT pedal pulses palpable bilaterally. Temperature gradient within normal limits.  Neuro Epicritic and protective threshold sensation grossly intact bilaterally.  Musculoskeletal Exam No symptomatic pedal deformities noted bilateral.  Nonambulatory in a wheelchair  ASSESSMENT 1.  Pain due to onychomycosis of toenails both  PLAN OF CARE -Patient evaluated today.  -Instructed to maintain good pedal hygiene and foot care.  -Mechanical debridement of nails 1-5 bilaterally performed using a nail nipper. Filed with dremel without incident.  -Return to clinic PRN   Thresa EMERSON Sar, DPM [facility]  Dr. Thresa EMERSON Sar, DPM     [address] [place], [place] [address]                Office [phone]  Fax [phone]

## 2023-11-25 ENCOUNTER — Other Ambulatory Visit: Payer: Self-pay | Admitting: Neurology

## 2023-11-27 ENCOUNTER — Ambulatory Visit: Payer: Self-pay

## 2023-11-27 NOTE — Telephone Encounter (Signed)
 Noted

## 2023-11-27 NOTE — Telephone Encounter (Signed)
 FYI Only or Action Required?: FYI only for provider.  Patient was last seen in primary care on 11/09/2023 by Medina-Vargas, Jereld BROCKS, NP.  Called Nurse Triage reporting Arm Pain.  Symptoms began several days ago. Arm pain x 3 days, Jaw pain x 2 days  Interventions attempted: Nothing.  Symptoms are: arm pain is intermittent, jaw pain is constant.  Triage Disposition: See PCP When Office is Open (Within 3 Days), Home Care  Patient/caregiver understands and will follow disposition?: Yes Copied from CRM [id number]. Topic: Clinical - Red Word Triage >> Nov 27, 2023  4:25 PM Brittney F wrote: [facility] that prompted transfer to Nurse Triage:   Pain in left arm; pain on the right side of your jaw  Right jaw at hinge, yesterday Reason for Disposition  Arm pain  [1] MILD-MODERATE mouth pain AND [2] present > 3 days  Answer Assessment - Initial Assessment Questions 1. ONSET: When did the pain start?     Pain started 2 days ago, on Sunday  2. LOCATION: Where is the pain located?     Just above the left bicep   3. PAIN: How bad is the pain? (Scale 0-10; or none, mild, moderate, severe)     No pain at the moment  4. WORK OR EXERCISE: Has there been any recent work or exercise that involved this part of the body?     No, just regular daily exercising  5. CAUSE: What do you think is causing the arm pain?     Unsure of pain  6. OTHER SYMPTOMS: Do you have any other symptoms? (e.g., neck pain, swelling, rash, fever, numbness, weakness)     No  Answer Assessment - Initial Assessment Questions 1. ONSET: When did your mouth start hurting? (e.g., hours or days ago)      2 days ago  2. SEVERITY: How bad is the pain? (Scale 1-10; or mild, moderate or severe)     5/10 pain, unable to open  3. SORES: Are there any sores or ulcers in the mouth? If Yes, ask: What part of the mouth are the sores in?     No  4. FEVER: Do you have a fever? If Yes, ask: What is your  temperature, how was it measured, and when did it start?     No  5. CAUSE: What do you think is causing the mouth pain?     Unsure of cause  6. OTHER SYMPTOMS: Do you have any other symptoms? (e.g., difficulty breathing)     Trouble opening mouth  Protocols used: Arm Pain-A-AH, Mouth Pain-A-AH

## 2023-11-29 ENCOUNTER — Encounter: Payer: Self-pay | Admitting: Adult Health

## 2023-11-29 ENCOUNTER — Ambulatory Visit: Admitting: Adult Health

## 2023-11-29 MED ORDER — LOSARTAN POTASSIUM-HCTZ 50-12.5 MG PO TABS: 1.0000 | ORAL_TABLET | Freq: Every day | ORAL | 1 refills | Status: DC

## 2024-02-05 ENCOUNTER — Telehealth: Payer: Self-pay | Admitting: Neurology

## 2024-02-05 NOTE — Telephone Encounter (Signed)
 Call to patient, he reports having pain in his neck. He denies any stroke symptoms or seizures. He denies chest pain or or difficulty breathing. He isn't sure if he slept on his neck wrong. He is concerned if pain could be related to MS. He denies any other symptoms. I advised Dr. Onita is not in the office but he is due for an appt. PT is now scheduled and aware to call PCP or go to urgent care if pain increases or doesn't go away.

## 2024-02-05 NOTE — Telephone Encounter (Signed)
 Pt called to request to speak to Nurse . Pt states that he is having sever Pain in right side of neck and may think its his MS . Pt states that the pain is moving to his head and really would like to discuss with MD

## 2024-02-11 ENCOUNTER — Ambulatory Visit: Admitting: Adult Health

## 2024-02-13 ENCOUNTER — Ambulatory Visit: Admitting: Adult Health

## 2024-02-13 VITALS — BP 128/86 | HR 90 | Temp 98.1°F | Ht 74.0 in | Wt 200.0 lb

## 2024-02-13 DIAGNOSIS — G35D Multiple sclerosis, unspecified: Secondary | ICD-10-CM | POA: Diagnosis not present

## 2024-02-13 DIAGNOSIS — R3915 Urgency of urination: Secondary | ICD-10-CM

## 2024-02-13 DIAGNOSIS — R7303 Prediabetes: Secondary | ICD-10-CM | POA: Diagnosis not present

## 2024-02-13 DIAGNOSIS — I10 Essential (primary) hypertension: Secondary | ICD-10-CM

## 2024-02-13 DIAGNOSIS — G40909 Epilepsy, unspecified, not intractable, without status epilepticus: Secondary | ICD-10-CM

## 2024-02-13 DIAGNOSIS — Z23 Encounter for immunization: Secondary | ICD-10-CM

## 2024-02-13 DIAGNOSIS — Z978 Presence of other specified devices: Secondary | ICD-10-CM

## 2024-02-13 DIAGNOSIS — I82512 Chronic embolism and thrombosis of left femoral vein: Secondary | ICD-10-CM | POA: Diagnosis not present

## 2024-02-13 DIAGNOSIS — E782 Mixed hyperlipidemia: Secondary | ICD-10-CM

## 2024-02-13 NOTE — Progress Notes (Signed)
 [facility] clinic  Provider:  Jereld Serum DNP  Code Status:  Full Code  Goals of Care:     03/19/2023    3:08 PM  Advanced Directives  Does Patient Have a Medical Advance Directive? Yes  Type of Advance Directive Living will  Does patient want to make changes to medical advance directive? No - Patient declined     Chief Complaint  Patient presents with   Follow-up    3 month follow up. Patient is concerns about neck pain. Patient has concerns about hearing his heart beat while laying down.   Discussed the use of AI scribe software for clinical note transcription with the patient, who gave verbal consent to proceed.   HPI: Patient is a 63 y.o. male seen today for a 68-month follow up of chronic medical issues.  He experiences neck pain and a sensation of hearing his heartbeat while lying down. He also has intermittent wheezing and occasionally feels dizzy. No chest pain or palpitations.  He has a history of multiple sclerosis, managed with baclofen  20 mg in the morning and at noon, and two tablets at night for muscle spasms. He experiences left-sided weakness, particularly in the left foot, which drags, and the left hand, which is functional but weak. He has foot drop on the left side.  He experienced a seizure a year ago and is currently taking Keppra  500 mg twice a day.  He has hypertension, with a current blood pressure reading of 128/86 mmHg, managed with hydrochlorothiazide  50/12.5 mg daily.  He has mixed hyperlipidemia, with a cholesterol level of 226 mg/dL and an LDL of 865 mg/dL as of July 88uy. He is not currently on any cholesterol medication.  He has a history of chronic deep vein thrombosis (DVT) in the left lower extremity and is on Eliquis  5 mg twice a day.  He has a chronic Foley catheter, which is changed every four weeks by a nurse at his urologist's office.  He reports constipation, with his last bowel movement occurring yesterday. No blood in  stool.  His A1c was 5.9% as of last July.    Past Medical History:  Diagnosis Date   Abnormality of gait 04/22/2013   Asthma    BPH (benign prostatic hyperplasia)    Hypertension    Multiple sclerosis    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    Past Surgical History:  Procedure Laterality Date   COLONOSCOPY WITH PROPOFOL  N/A 03/14/2022   Procedure: COLONOSCOPY WITH PROPOFOL ;  Surgeon: Elicia Claw, MD;  Location: [facility];  Service: Gastroenterology;  Laterality: N/A;   none      Allergies  Allergen Reactions   Peanut-Containing Drug Products Shortness Of Breath   Zanaflex  [Tizanidine  Hcl] Shortness Of Breath   Interferon Beta-1a Other (See Comments)    Outpatient Encounter Medications as of 02/13/2024  Medication Sig   apixaban  (ELIQUIS ) 5 MG TABS tablet Take 1 tablet (5 mg total) by mouth 2 (two) times daily. Start taking after completion of starter pack.   baclofen  (LIORESAL ) 20 MG tablet TAKE 1 TABLET BY MOUTH IN THE MORNING AND 1 TABLET AT NOON AND 2 TABLETS NIGHTLY   Cholecalciferol (VITAMIN D-3) 125 MCG (5000 UT) TABS Take 5,000 Units by mouth daily.   DULoxetine  (CYMBALTA ) 60 MG capsule Take 1 capsule by mouth once daily   levETIRAcetam  (KEPPRA ) 500 MG tablet Take 1 tablet by mouth twice daily   losartan -hydrochlorothiazide  (HYZAAR) 50-12.5 MG tablet Take 1 tablet by mouth daily.   [  DISCONTINUED] terbinafine  (LAMISIL  AT) 1 % cream Apply 1 Application topically 2 (two) times daily. (Patient not taking: Reported on 02/13/2024)   No facility-administered encounter medications on file as of 02/13/2024.    Review of Systems:  Review of Systems  Constitutional:  Negative for activity change, appetite change and fever.  HENT:  Negative for sore throat.   Eyes: Negative.   Cardiovascular:  Negative for chest pain and leg swelling.  Gastrointestinal:  Negative for abdominal distention, diarrhea and vomiting.  Genitourinary:  Negative for dysuria,  frequency and urgency.  Skin:  Negative for color change.  Neurological:  Negative for dizziness and headaches.  Psychiatric/Behavioral:  Negative for behavioral problems and sleep disturbance. The patient is not nervous/anxious.     Health Maintenance  Topic Date Due   Pneumococcal Vaccine: 50+ Years (1 of 1 - PCV) Never done   Fecal DNA (Cologuard)  03/09/2021   Influenza Vaccine  11/30/2023   Hepatitis C Screening  Completed   HIV Screening  Completed   Hepatitis B Vaccines 19-59 Average Risk  Aged Out   HPV VACCINES  Aged Out   Meningococcal B Vaccine  Aged Out   DTaP/Tdap/Td  Discontinued   COVID-19 Vaccine  Discontinued   Zoster Vaccines- Shingrix  Discontinued    Physical Exam: Vitals:   02/13/24 1331  BP: 128/86  Pulse: 90  Temp: 98.1 F (36.7 C)  Weight: 200 lb (90.7 kg)  Height: 6' 2 (1.88 m)   Body mass index is 25.68 kg/m. Physical Exam Constitutional:      Appearance: Normal appearance.  HENT:     Head: Normocephalic and atraumatic.     Mouth/Throat:     Mouth: Mucous membranes are moist.  Eyes:     Conjunctiva/sclera: Conjunctivae normal.  Cardiovascular:     Rate and Rhythm: Normal rate and regular rhythm.     Pulses: Normal pulses.     Heart sounds: Normal heart sounds.  Pulmonary:     Effort: Pulmonary effort is normal.     Breath sounds: Normal breath sounds.  Abdominal:     General: Bowel sounds are normal.     Palpations: Abdomen is soft.  Genitourinary:    Comments: Foley catheter Musculoskeletal:        General: No swelling.     Cervical back: Normal range of motion.  Skin:    General: Skin is warm and dry.  Neurological:     General: No focal deficit present.     Mental Status: He is alert and oriented to person, place, and time.     Motor: Weakness present.     Comments: LUE and LLE weakness  Psychiatric:        Mood and Affect: Mood normal.        Behavior: Behavior normal.        Thought Content: Thought content normal.         Judgment: Judgment normal.     Labs reviewed: Basic Metabolic Panel: Recent Labs    04/02/23 1527 07/09/23 1546 11/09/23 1328  NA 144 141 143  K 4.0 3.7 3.6  CL 103 101 105  CO2 33* 29 28  GLUCOSE 92 123 94  BUN 18 24 18   CREATININE 0.92 1.10 0.88  CALCIUM 10.5* 9.7 9.8   Liver Function Tests: Recent Labs    04/02/23 1527  AST 19  ALT 18  BILITOT 0.6  PROT 7.7   No results for input(s): LIPASE, AMYLASE in the last 8760 hours.  No results for input(s): AMMONIA in the last 8760 hours. CBC: Recent Labs    07/09/23 1546 07/19/23 1337 11/09/23 1328  WBC 11.5* 8.0 6.7  NEUTROABS 9,051* 5,000 4,100  HGB 13.9 13.4 13.9  HCT 42.1 41.5 43.4  MCV 82.4 82.0 84.1  PLT 208 376 229   Lipid Panel: Recent Labs    04/02/23 1527 11/09/23 1328  CHOL 241* 226*  HDL 68 74  LDLCALC 150* 134*  TRIG 114 83  CHOLHDL 3.5 3.1   Lab Results  Component Value Date   HGBA1C 5.9 (H) 11/09/2023    Procedures since last visit: No results found.  Assessment/Plan  1. Seizure disorder (HCC) (Primary) -  managed with Keppra . - Continue Keppra  500 mg BID.  2. Multiple sclerosis -  with left-sided weakness and foot drop affecting left extremities. - Continue baclofen  20 mg AM and noon, 40 mg night for spasms. - Encourage regular exercise for strength and mobility. - Continue neurologist follow-up with Dr. Ross.  3. Prediabetes -  A1c 5.9%. - Encourage regular exercise. - Advise portion control in diet.  4. Chronic deep vein thrombosis (DVT) of femoral vein of left lower extremity (HCC) -  managed with Eliquis . - Continue Eliquis  5 mg BID.  5. Chronic indwelling Foley catheter -  Continue urologist follow-up every four weeks.  6. Primary hypertension -  controlled with hydrochlorothiazide , BP 128/86 mmHg. - Continue hydrochlorothiazide  50/12.5 mg daily.  7. Immunization due - Pneumococcal conjugate vaccine 20-valent (Prevnar 20) - Flu vaccine trivalent PF,  6mos and older(Flulaval,Afluria,Fluarix,Fluzone)    Labs/tests ordered:  will have fasting labs next visit   Return in about 3 months (around 05/15/2024).  Anique Beckley Medina-Vargas, NP

## 2024-02-25 ENCOUNTER — Telehealth: Payer: Self-pay | Admitting: Neurology

## 2024-03-03 ENCOUNTER — Encounter: Payer: Self-pay | Admitting: Radiology

## 2024-03-04 ENCOUNTER — Encounter: Payer: Self-pay | Admitting: Neurology

## 2024-03-04 ENCOUNTER — Ambulatory Visit: Admitting: Neurology

## 2024-03-04 VITALS — BP 160/92 | HR 114

## 2024-03-04 DIAGNOSIS — R269 Unspecified abnormalities of gait and mobility: Secondary | ICD-10-CM

## 2024-03-04 DIAGNOSIS — G8250 Quadriplegia, unspecified: Secondary | ICD-10-CM | POA: Diagnosis not present

## 2024-03-04 DIAGNOSIS — G35D Multiple sclerosis, unspecified: Secondary | ICD-10-CM

## 2024-03-04 DIAGNOSIS — G40909 Epilepsy, unspecified, not intractable, without status epilepticus: Secondary | ICD-10-CM | POA: Diagnosis not present

## 2024-03-04 NOTE — Progress Notes (Signed)
 [place]   PATIENT: Randy Shaw DOB: 12/21/1960  REASON FOR VISIT: follow up HISTORY FROM: patient  Chief Complaint  Patient presents with   Follow-up    Pt in room 15. Alone. Here for neck pain follow up.   DIAGNOSTIC DATA (LABS, IMAGING, TESTING) - I reviewed patient records, labs, notes, testing and imaging myself where available.     ASSESSMENT AND PLAN 63 y.o. year old male      1. Multiple sclerosis-secondary progressive 2.  Seizures 3.  Abnormal gait and balance  He has significant brain, cervical and thoracic lesion load, with his reported worsening symptoms in 2022, he was given a trial of Ocrelizumab from April 2023 to Oct 2024, he denies any improvement, continued progression of left side difficulty, difficulty transfer himself,  Repeat MRI of the brain, cervical, thoracic spine with and without contrast showed extensive lesion load, no contrast-enhancement  Stop ocrelizumab to decrease potential complications, 4.  Worsening weakness,  Foley catheter, high risk for UTI,  Laboratory evaluation today,  Referral to home health aide, nursing, physical therapy, social worker, also for clean catch for urine sample  Virtual visit in one year  HISTORY OF PRESENT ILLNESS:  Randy OSBOURNE,  previous patient of Dr. Jenel for relapsing remitting multiple sclerosis,  also  had seizure on March 03, 2021   I reviewed and summarized the referring note.PMHX Hypertension Relapsing remitting multiple sclerosis   Patient was diagnosed with remitting multiple sclerosis in July 1995, he presented with right arm numbness, later developed numbness in his left arm, progressive gait difficulty since 2000,.  He also has double vision. He was treated with Avonex in 1995 for one year, was switched to betaseron from 1996 till 2015, when he started to take Tecfidra.  Last flare up was around 2005.   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  The patient uses  a wheelchair since 2005. The patient is able to ambulate using a walker but primarily uses the scooter.    MRI of the brain with and without contrast in May 2021, multiple supra and infratentorial chronic demyelinating plaque, no change compared to March 2019 MRI of cervical spine with without contrast, multiple chronic demyelinating plaque from C2-C7, no change MRI of thoracic spine with without contrast August 16, 2020, scattered foci of abnormal T2 signal within the thoracic spinal cord, no cord compression,    Despite no imaging changes, patient continues complain some worsening symptoms since 2021, has worsening double vision, could no longer move left eye past midline, in addition, he noticed increased weakness of left arm and leg,    He also noticed increased difficulty urinate, required Foley catheter now,   New onset seizure March 03, 2021, that was witnessed by his son,  taking Keppra  500 mg twice a day  He lives with his son, but still quite independent in his daily activity, can transfer himself  in and out of electronic wheelchair  Repeat MRIs showed extensive lesion involving brain in December 2022, but no contrast-enhancement  Consider possible secondary progressive MS with his complaints of worsening symptoms, he was given a trial of ocrelizumab treatment, initially infusion April 2023, most recent infusion was October 28th 2024, he denies any improvement, actually complains continued worsening of his difficulties, now he often need his sons help to transfer in and out of electronic wheelchair, constant double vision, worse looking to the right side   Lab in Feb 2024: Hg 12.8. normal CMP,  IG G 952,   Personally reviewed MRI of the brain with and without contrast in April 2024 Multiple T2/FLAIR hyperintense foci in the cerebral hemispheres, brainstem and left middle cerebellar peduncle in a pattern consistent with chronic demyelinating plaque associated with multiple sclerosis.   None of the foci enhance or appear to be acute.  Compared to the MRI from 2022, there are no new lesions. Normal enhancement pattern.  No acute findings.  MRI cervical w/wo Multiple T2 hyperintense foci within the spinal cord and posterior fossa as detailed above consistent with demyelinating plaque associated with multiple sclerosis.  None of the foci enhance or appear to be acute.  No new lesions compared to the 2021 MRI. Degenerative changes at C2-C3 at C3-C4 that do not lead to spinal stenosis or nerve root compression.  These are stable compared to the 2021 MRI. MRI thoracic w/wo Multiple T2 hyperintense foci within the spinal cord as detailed above. None of the foci appear to be acute. They do not enhance. Compared to the MRI from 08/16/2020, there do not appear to be any new lesions. These are consistent with chronic demyelinating plaque associated with multiple sclerosis.   UPDATE Nov 4th 2025: He lives with his son, goes to work daily, has part time care giver, 5 days a week, from 11am to 2pm, he has slow decline in functional status, increased left arm leg weakness, difficult to transfer himself, spent most of time in bed or electronic wheelchair, can operate electronic wheelchair using his right hand, blurry vision, mild intermittent confusion  He has increased urinary problem, wears diaper, under the care of [facility], had Foley catheter placement few months ago,  He came in complaints of 2 weeks history of worsening the achy all over, increased generalized weakness, harder for him to transfer in and out of his wheelchair,  PHYSICAL EXAM  Vitals:   02/27/23 1044  BP: (!) 176/92  Pulse: 61  Resp: 15  Height: 6' 2 (1.88 m)   Body mass index is 25.68 kg/m.  Generalized: Well developed, tired looking elderly male,  Neurological examination  Mentation: Alert oriented to time, place, history taking. Follows all commands speech and language fluent Cranial nerve II-XII:  Pupils were equal round reactive to light.  Left INO,   facial sensation and strength were normal. Uvula tongue midline. Head turning and shoulder shrug  were normal and symmetric. Motor: Spastic diplegia, left more than right, there was no antigravity movement of left upper extremity, no significant movement of left lower extremity,  Sensory: Sensory testing is intact to soft touch on all 4 extremities.  Deep tendon reflexes, hyperreflexia, Coordination: Cerebellar testing reveals ataxia bilaterally in the finger-nose-finger.  Gait and station: Deferred  REVIEW OF SYSTEMS: Out of a complete 14 system review of symptoms, the patient complains only of the following symptoms, and all other reviewed systems are negative.  ALLERGIES: Allergies  Allergen Reactions   Peanut-Containing Drug Products Shortness Of Breath   Zanaflex  [Tizanidine  Hcl] Shortness Of Breath   Interferon Beta-1a Other (See Comments)    HOME MEDICATIONS: Outpatient Medications Prior to Visit  Medication Sig Dispense Refill   apixaban  (ELIQUIS ) 5 MG TABS tablet Take 1 tablet (5 mg total) by mouth 2 (two) times daily. Start taking after completion of starter pack. 180 tablet 3   baclofen  (LIORESAL ) 20 MG tablet TAKE 1 TABLET BY MOUTH IN THE MORNING AND 1 TABLET AT NOON AND 2 TABLETS NIGHTLY 120 tablet 3   Cholecalciferol (VITAMIN D-3)  125 MCG (5000 UT) TABS Take 5,000 Units by mouth daily.     DULoxetine  (CYMBALTA ) 60 MG capsule Take 1 capsule by mouth once daily 30 capsule 5   levETIRAcetam  (KEPPRA ) 500 MG tablet Take 1 tablet by mouth twice daily 180 tablet 0   losartan -hydrochlorothiazide  (HYZAAR) 50-12.5 MG tablet Take 1 tablet by mouth daily. 90 tablet 1   No facility-administered medications prior to visit.    PAST MEDICAL HISTORY: Past Medical History:  Diagnosis Date   Abnormality of gait 04/22/2013   Asthma    BPH (benign prostatic hyperplasia)    Hypertension    Multiple sclerosis    Quadriplegia and  quadriparesis (HCC) 03/08/2016    PAST SURGICAL HISTORY: Past Surgical History:  Procedure Laterality Date   COLONOSCOPY WITH PROPOFOL  N/A 03/14/2022   Procedure: COLONOSCOPY WITH PROPOFOL ;  Surgeon: Elicia Claw, MD;  Location: [facility];  Service: Gastroenterology;  Laterality: N/A;   none      FAMILY HISTORY: Family History  Problem Relation Age of Onset   Cancer Mother        breast   Stroke Father    Cancer - Colon Father    Multiple sclerosis Neg Hx     SOCIAL HISTORY: Social History   Socioeconomic History   Marital status: Divorced    Spouse name: n/a   Number of children: 3   Years of education: 12   Highest education level: 12th grade  Occupational History   Occupation: disabled/retired 1999-[place]    Comment: Event Organiser  Tobacco Use   Smoking status: Never   Smokeless tobacco: Never  Substance and Sexual Activity   Alcohol use: Not Currently    Comment: Occasionally [place] ice tea   Drug use: No   Sexual activity: Never  Other Topics Concern   Not on file  Social History Narrative   Lives alone, house.     Right Handed   Drinks 2-3 cups caffeine   Social Drivers of Corporate Investment Banker Strain: Low Risk  (02/10/2024)   Overall Financial Resource Strain (CARDIA)    Difficulty of Paying Living Expenses: Not very hard  Food Insecurity: No Food Insecurity (02/10/2024)   Hunger Vital Sign    Worried About Running Out of Food in the Last Year: Never true    Ran Out of Food in the Last Year: Never true  Transportation Needs: Unmet Transportation Needs (02/10/2024)   PRAPARE - Administrator, Civil Service (Medical): Yes    Lack of Transportation (Non-Medical): Yes  Physical Activity: Inactive (02/10/2024)   Exercise Vital Sign    Days of Exercise per Week: 0 days    Minutes of Exercise per Session: Not on file  Stress: No Stress Concern Present (02/10/2024)   [employer] of Occupational Health -  Occupational Stress Questionnaire    Feeling of Stress: Only a little  Social Connections: Moderately Integrated (02/10/2024)   Social Connection and Isolation Panel    Frequency of Communication with Friends and Family: Not on file    Frequency of Social Gatherings with Friends and Family: More than three times a week    Attends Religious Services: More than 4 times per year    Active Member of [employer] or Organizations: Yes    Attends Banker Meetings: More than 4 times per year    Marital Status: Widowed  Intimate Partner Violence: Not on file       Modena Callander, M.D. Ph.D.  [facility] [address] [place], [place] [address] Phone: [phone] Fax:      [phone]

## 2024-03-05 LAB — CBC WITH DIFFERENTIAL/PLATELET

## 2024-03-05 LAB — URINALYSIS, ROUTINE W REFLEX MICROSCOPIC

## 2024-03-06 ENCOUNTER — Telehealth: Payer: Self-pay | Admitting: Neurology

## 2024-03-06 LAB — URINALYSIS, ROUTINE W REFLEX MICROSCOPIC

## 2024-03-06 LAB — CBC WITH DIFFERENTIAL/PLATELET
Basos: 0 %
EOS (ABSOLUTE): 0.1 x10E3/uL (ref 0.0–0.2)
Eos: 1 %
Hematocrit: 48.3 % (ref 37.5–51.0)
Hemoglobin: 15.5 g/dL (ref 13.0–17.7)
Immature Granulocytes: 0.1 x10E3/uL (ref 0.0–0.1)
Immature Granulocytes: 1 %
Lymphs: 11 %
MCH: 27.4 pg (ref 26.6–33.0)
MCHC: 32.1 g/dL (ref 31.5–35.7)
MCV: 86 fL (ref 79–97)
Monocytes Absolute: 0.1 x10E3/uL (ref 0.0–0.4)
Monocytes Absolute: 1.1 x10E3/uL — AB (ref 0.1–0.9)
Monocytes: 7 %
Neutrophils Absolute: 1.7 x10E3/uL (ref 0.7–3.1)
Neutrophils Absolute: 12.8 x10E3/uL — AB (ref 1.4–7.0)
Neutrophils: 80 %
Platelets: 176 x10E3/uL (ref 150–450)
RBC: 5.65 x10E6/uL (ref 4.14–5.80)
RDW: 12.9 % (ref 11.6–15.4)
WBC: 15.8 x10E3/uL — AB (ref 3.4–10.8)

## 2024-03-06 LAB — TSH: TSH: 0.684 u[IU]/mL (ref 0.450–4.500)

## 2024-03-06 LAB — C-REACTIVE PROTEIN: CRP: 60 mg/L — AB (ref 0–10)

## 2024-03-06 LAB — SEDIMENTATION RATE: Sed Rate: 31 mm/h — ABNORMAL HIGH (ref 0–30)

## 2024-03-06 NOTE — Telephone Encounter (Signed)
 I have reached out to our home health companies and they have all informed me that they do not take medicaid patients. He will have to do outpatient referrals.

## 2024-03-06 NOTE — Telephone Encounter (Addendum)
 I called [facility] and they are not taking medicaid at this time.  I called [facility] [phone], they will call back. They returned call Ms. Randy Shaw said that they are not currently excepting but are working at adding this to there excepted plans in future date.  [facility], he has been pt of them before.  Are checking with them.

## 2024-03-06 NOTE — Telephone Encounter (Signed)
 I asked [facility], [facility], [facility] and [facility] and they all can't take medicaid. We do not have an office rep right now for [facility] so I faxed them a referral form to see if they can take him.

## 2024-03-07 ENCOUNTER — Other Ambulatory Visit: Payer: Self-pay | Admitting: Neurology

## 2024-03-07 NOTE — Telephone Encounter (Signed)
 I informed patient via Mychart

## 2024-03-10 ENCOUNTER — Telehealth: Payer: Self-pay | Admitting: Neurology

## 2024-03-10 NOTE — Telephone Encounter (Signed)
 Please call patient, laboratory evaluation showed elevated ESR C-reactive protein, WBC, 15.8, with elevated neutrophil, worry about systemic infection,  Need to rule out urinary tract infection, he can contact his urologist or primary care for urine sample analysis, and treatment guidance

## 2024-03-10 NOTE — Telephone Encounter (Signed)
 I called pt and LMVM for him to call us  but will also put results on mychart as well as recommendation.

## 2024-03-13 ENCOUNTER — Ambulatory Visit: Admitting: Adult Health

## 2024-03-13 ENCOUNTER — Encounter: Payer: Self-pay | Admitting: Adult Health

## 2024-03-13 VITALS — BP 124/76 | HR 85 | Temp 97.7°F

## 2024-03-13 DIAGNOSIS — G629 Polyneuropathy, unspecified: Secondary | ICD-10-CM

## 2024-03-13 DIAGNOSIS — I10 Essential (primary) hypertension: Secondary | ICD-10-CM | POA: Diagnosis not present

## 2024-03-13 DIAGNOSIS — I82512 Chronic embolism and thrombosis of left femoral vein: Secondary | ICD-10-CM | POA: Diagnosis not present

## 2024-03-13 DIAGNOSIS — T83511A Infection and inflammatory reaction due to indwelling urethral catheter, initial encounter: Secondary | ICD-10-CM

## 2024-03-13 DIAGNOSIS — G35D Multiple sclerosis, unspecified: Secondary | ICD-10-CM | POA: Diagnosis not present

## 2024-03-13 DIAGNOSIS — N390 Urinary tract infection, site not specified: Secondary | ICD-10-CM | POA: Diagnosis not present

## 2024-03-13 DIAGNOSIS — R569 Unspecified convulsions: Secondary | ICD-10-CM | POA: Diagnosis not present

## 2024-03-13 LAB — POCT URINALYSIS DIPSTICK
Glucose, UA: NEGATIVE
Ketones, UA: NEGATIVE
Nitrite, UA: POSITIVE
Protein, UA: POSITIVE — AB
Spec Grav, UA: >=1.03 — AB (ref 1.010–1.025)
Urobilinogen, UA: 0.2 U/dL
pH, UA: 5 (ref 5.0–8.0)

## 2024-03-13 MED ORDER — NITROFURANTOIN MONOHYD MACRO 100 MG PO CAPS: 100.0000 mg | ORAL_CAPSULE | Freq: Two times a day (BID) | ORAL | 0 refills | Status: AC

## 2024-03-13 NOTE — Progress Notes (Signed)
 [facility]  Provider:  Jereld Serum DNP  Code Status:  Full Code  Goals of Care:     03/19/2023    3:08 PM  Advanced Directives  Does Patient Have a Medical Advance Directive? Yes  Type of Advance Directive Living will  Does patient want to make changes to medical advance directive? No - Patient declined     Chief Complaint  Patient presents with   Cystitis   Discussed the use of AI scribe software for clinical note transcription with the patient, who gave verbal consent to proceed.   HPI: Patient is a 63 y.o. male seen today for an acute visit for symptoms suggestive of a urinary tract infection.  He has been experiencing a burning sensation during urination for the past two weeks. No fever is present. He mentions back pain, which he initially thought might be related to his kidneys. A recent urine test showed positive nitrite, protein, moderate leukocytes, and large bilirubin.  He is currently taking several medications: Cymbalta  60 mg daily for neuropathy, Keppra  500 mg twice a day for seizures, Hyzaar 50/12.5 mg daily for hypertension, Baclofen  20 mg in the morning, one tablet at noon, and two tablets at night for multiple sclerosis, and Eliquis  5 mg twice a day for a history of deep vein thrombosis (DVT).  He experiences falls when transferring from bed to chair or toilet, but has not sustained any injuries from these falls. His left leg is weak, making it difficult for him to stand or move it, and he is scheduled to start physical therapy next week.  No fever, recent seizure episodes, or current pain.    Past Medical History:  Diagnosis Date   Abnormality of gait 04/22/2013   Asthma    BPH (benign prostatic hyperplasia)    Hypertension    Multiple sclerosis    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    Past Surgical History:  Procedure Laterality Date   COLONOSCOPY WITH PROPOFOL  N/A 03/14/2022   Procedure: COLONOSCOPY WITH PROPOFOL ;  Surgeon:  Elicia Claw, MD;  Location: [facility];  Service: Gastroenterology;  Laterality: N/A;   none      Allergies  Allergen Reactions   Peanut-Containing Drug Products Shortness Of Breath   Zanaflex  [Tizanidine  Hcl] Shortness Of Breath   Interferon Beta-1a Other (See Comments)    Outpatient Encounter Medications as of 03/13/2024  Medication Sig   apixaban  (ELIQUIS ) 5 MG TABS tablet Take 1 tablet (5 mg total) by mouth 2 (two) times daily. Start taking after completion of starter pack.   Cholecalciferol (VITAMIN D-3) 125 MCG (5000 UT) TABS Take 5,000 Units by mouth daily.   DULoxetine  (CYMBALTA ) 60 MG capsule Take 1 capsule by mouth once daily   levETIRAcetam  (KEPPRA ) 500 MG tablet Take 1 tablet by mouth twice daily   losartan -hydrochlorothiazide  (HYZAAR) 50-12.5 MG tablet Take 1 tablet by mouth daily.   [EXPIRED] nitrofurantoin , macrocrystal-monohydrate, (MACROBID ) 100 MG capsule Take 1 capsule (100 mg total) by mouth 2 (two) times daily for 7 days.   [DISCONTINUED] baclofen  (LIORESAL ) 20 MG tablet TAKE 1 TABLET BY MOUTH IN THE MORNING AND 1 TABLET AT NOON AND 2 TABLETS NIGHTLY   No facility-administered encounter medications on file as of 03/13/2024.    Review of Systems:  Review of Systems  Constitutional:  Negative for activity change, appetite change and fever.  HENT:  Negative for sore throat.   Eyes: Negative.   Cardiovascular:  Negative for chest pain and leg swelling.  Gastrointestinal:  Negative for abdominal distention, diarrhea and vomiting.  Genitourinary:  Positive for dysuria. Negative for frequency and urgency.  Skin:  Negative for color change.  Neurological:  Negative for dizziness and headaches.  Psychiatric/Behavioral:  Negative for behavioral problems and sleep disturbance. The patient is not nervous/anxious.     Health Maintenance  Topic Date Due   Fecal DNA (Cologuard)  03/09/2021   Pneumococcal Vaccine: 50+ Years  Completed   Influenza Vaccine   Completed   Hepatitis C Screening  Completed   HIV Screening  Completed   Hepatitis B Vaccines 19-59 Average Risk  Aged Out   HPV VACCINES  Aged Out   Meningococcal B Vaccine  Aged Out   DTaP/Tdap/Td  Discontinued   COVID-19 Vaccine  Discontinued   Zoster Vaccines- Shingrix  Discontinued    Physical Exam: Vitals:   03/13/24 1133  BP: 124/76  Pulse: 85  Temp: 97.7 F (36.5 C)  TempSrc: Temporal  SpO2: 98%   There is no height or weight on file to calculate BMI. Physical Exam Constitutional:      Appearance: Normal appearance.  HENT:     Head: Normocephalic and atraumatic.     Mouth/Throat:     Mouth: Mucous membranes are moist.  Eyes:     Conjunctiva/sclera: Conjunctivae normal.  Cardiovascular:     Rate and Rhythm: Normal rate and regular rhythm.     Pulses: Normal pulses.     Heart sounds: Normal heart sounds.  Pulmonary:     Effort: Pulmonary effort is normal.     Breath sounds: Normal breath sounds.  Abdominal:     General: Bowel sounds are normal.     Palpations: Abdomen is soft.  Genitourinary:    Comments: Chronic foley catheter Musculoskeletal:        General: No swelling.     Cervical back: Normal range of motion.  Skin:    General: Skin is warm and dry.  Neurological:     General: No focal deficit present.     Mental Status: He is alert and oriented to person, place, and time.     Motor: Weakness present.     Comments: Left leg weak  Psychiatric:        Mood and Affect: Mood normal.        Behavior: Behavior normal.        Thought Content: Thought content normal.        Judgment: Judgment normal.     Labs reviewed: Basic Metabolic Panel: Recent Labs    04/02/23 1527 07/09/23 1546 11/09/23 1328 03/04/24 1512  NA 144 141 143  --   K 4.0 3.7 3.6  --   CL 103 101 105  --   CO2 33* 29 28  --   GLUCOSE 92 123 94  --   BUN 18 24 18   --   CREATININE 0.92 1.10 0.88  --   CALCIUM 10.5* 9.7 9.8  --   TSH  --   --   --  0.684   Liver  Function Tests: Recent Labs    04/02/23 1527  AST 19  ALT 18  BILITOT 0.6  PROT 7.7   No results for input(s): LIPASE, AMYLASE in the last 8760 hours. No results for input(s): AMMONIA in the last 8760 hours. CBC: Recent Labs    07/19/23 1337 11/09/23 1328 03/04/24 1512  WBC 8.0 6.7 15.8*  NEUTROABS 5,000 4,100 12.8*  HGB 13.4 13.9 15.5  HCT 41.5 43.4 48.3  MCV  82.0 84.1 86  PLT 376 229 176   Lipid Panel: Recent Labs    04/02/23 1527 11/09/23 1328  CHOL 241* 226*  HDL 68 74  LDLCALC 150* 134*  TRIG 114 83  CHOLHDL 3.5 3.1   Lab Results  Component Value Date   HGBA1C 5.9 (H) 11/09/2023    Procedures since last visit: No results found.  Assessment/Plan  1. Urinary tract infection associated with indwelling urethral catheter, initial encounter (Primary) -  likely due to Foley catheter. Urinalysis shows positive nitrites, proteinuria, moderate leukocytes, and large bilirubin. No fever reported. - Prescribed Macrobid  100 mg twice daily for 7 days. - Sent urine for culture. - Advised increased fluid intake. - Culture, Urine - POCT urinalysis dipstick - nitrofurantoin , macrocrystal-monohydrate, (MACROBID ) 100 MG capsule; Take 1 capsule (100 mg total) by mouth 2 (two) times daily for 7 days.  Dispense: 14 capsule; Refill: 0  2. Neuropathy -  Managed with Cymbalta . No current pain reported. - Continue Cymbalta  60 mg daily.  3. Seizure (HCC) -  Managed with Keppra . No recent seizure episodes reported. - Continue Keppra  500 mg twice daily.   4. Primary hypertension -  Well-controlled with Hyzaar. Blood pressure is 124/76 mmHg. - Continue Hyzaar 50/12.5 mg daily.  5. Multiple sclerosis -  Reports left leg weakness, difficulty standing, and falls when transferring from bed to chair. - Continue baclofen  as prescribed.  6. Chronic deep vein thrombosis (DVT) of femoral vein of left lower extremity (HCC) -  Managed with Eliquis . No recent falls with head  injury reported. - Continue Eliquis  5 mg twice daily.     Labs/tests ordered:  Urine dipstick and urine culture    Return if symptoms worsen or fail to improve.  Randy Kuehl Medina-Vargas, NP

## 2024-03-17 ENCOUNTER — Ambulatory Visit: Payer: Self-pay | Admitting: Adult Health

## 2024-03-17 LAB — URINE CULTURE
MICRO NUMBER:: 17232578
SPECIMEN QUALITY:: ADEQUATE

## 2024-03-17 NOTE — Progress Notes (Signed)
-    Bacteria isolated on urine culture was sensitive to Nitrofurantoin which was prescribed during the visit. Pls continue taking Nitrofurantoin as prescribed.

## 2024-03-21 ENCOUNTER — Other Ambulatory Visit: Payer: Self-pay | Admitting: Neurology

## 2024-03-21 DIAGNOSIS — G35D Multiple sclerosis, unspecified: Secondary | ICD-10-CM

## 2024-03-21 NOTE — Telephone Encounter (Signed)
 Last seen on 03/04/24 Follow up scheduled 03/10/25    Dispensed Days Supply Quantity Provider Pharmacy  BACLOFEN  20MG        TAB 02/14/2024 30 120 each Onita Duos, MD [facility] [phone] ...      Did you want patient to continue medication? I didn't see in chart it mentioned to continue Rx.  Rx is pending

## 2024-04-16 ENCOUNTER — Other Ambulatory Visit: Payer: Self-pay | Admitting: Neurology

## 2024-04-16 DIAGNOSIS — G35D Multiple sclerosis, unspecified: Secondary | ICD-10-CM

## 2024-04-21 NOTE — Telephone Encounter (Signed)
 Sari from [facility]  called to request verbal for Home Health   1x-4w Callback [phone]

## 2024-04-22 NOTE — Telephone Encounter (Signed)
 I called [facility] PT provider and gave her the verbal OK for PT 1 x week for 4 wks.  She is to call back for questions.

## 2024-05-05 ENCOUNTER — Telehealth: Payer: Self-pay | Admitting: Neurology

## 2024-05-05 NOTE — Telephone Encounter (Signed)
 Sari, PT([phone] vm secure) w/ [facility] has called for a RN eval for a pressure wound on R Heel  PT for 1 week 8

## 2024-05-06 NOTE — Telephone Encounter (Signed)
 Called and left detailed message on voicemail the below is okay.

## 2024-05-12 ENCOUNTER — Telehealth: Payer: Self-pay | Admitting: *Deleted

## 2024-05-12 NOTE — Telephone Encounter (Signed)
 PT orders faxed to [facility] [phone] confirmation received.

## 2024-05-15 ENCOUNTER — Encounter: Payer: Self-pay | Admitting: Adult Health

## 2024-05-15 ENCOUNTER — Ambulatory Visit: Payer: Self-pay | Admitting: Adult Health

## 2024-05-15 VITALS — BP 136/78 | HR 84 | Temp 97.7°F

## 2024-05-15 DIAGNOSIS — S91301A Unspecified open wound, right foot, initial encounter: Secondary | ICD-10-CM | POA: Diagnosis not present

## 2024-05-15 DIAGNOSIS — I82512 Chronic embolism and thrombosis of left femoral vein: Secondary | ICD-10-CM

## 2024-05-15 DIAGNOSIS — G35D Multiple sclerosis, unspecified: Secondary | ICD-10-CM | POA: Diagnosis not present

## 2024-05-15 DIAGNOSIS — E782 Mixed hyperlipidemia: Secondary | ICD-10-CM | POA: Diagnosis not present

## 2024-05-15 DIAGNOSIS — E559 Vitamin D deficiency, unspecified: Secondary | ICD-10-CM

## 2024-05-15 DIAGNOSIS — I10 Essential (primary) hypertension: Secondary | ICD-10-CM | POA: Diagnosis not present

## 2024-05-15 DIAGNOSIS — R7303 Prediabetes: Secondary | ICD-10-CM

## 2024-05-15 DIAGNOSIS — G40909 Epilepsy, unspecified, not intractable, without status epilepticus: Secondary | ICD-10-CM | POA: Diagnosis not present

## 2024-05-15 DIAGNOSIS — G629 Polyneuropathy, unspecified: Secondary | ICD-10-CM | POA: Diagnosis not present

## 2024-05-15 NOTE — Progress Notes (Unsigned)
 "  [facility]  Provider:  Jereld Serum Shaw  Code Status:  Full Code  Goals of Care:     03/19/2023    3:08 PM  Advanced Directives  Does Patient Have a Medical Advance Directive? Yes  Type of Advance Directive Living will  Does patient want to make changes to medical advance directive? No - Patient declined     Chief Complaint  Patient presents with   Medical Management of Chronic Issues    3 month follow. Issue with his foot, injury on the back of both feet. Not healing     Discussed the use of AI scribe software for clinical note transcription with the patient, who gave verbal consent to proceed.  HPI: Patient is a 64 y.o. 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 Start taking after completion of starter pack.   baclofen  (LIORESAL ) 20 MG tablet TAKE 1 TABLET BY MOUTH IN THE MORNING AND 1 TABLET AT NOON AND 2 TABLET NIGHTLY   Cholecalciferol (VITAMIN D -3) 125 MCG (5000 UT) TABS Take 5,000 Units by mouth daily.   DULoxetine  (CYMBALTA ) 60 MG capsule Take 1 capsule by mouth once daily   levETIRAcetam  (KEPPRA ) 500 MG tablet Take 1 tablet by mouth twice daily   losartan -hydrochlorothiazide  (HYZAAR) 50-12.5 MG tablet Take 1 tablet by mouth daily.   No facility-administered encounter medications on file as of 05/15/2024.    Review  of Systems:  Review of Systems  Constitutional:  Negative for activity change, appetite change and fever.  HENT:  Negative for sore throat.   Eyes: Negative.   Cardiovascular:  Negative for chest pain and leg swelling.  Gastrointestinal:  Negative for abdominal distention, diarrhea and vomiting.  Genitourinary:  Negative for dysuria, frequency and urgency.  Skin:  Positive for wound. Negative for color change.  Neurological:  Positive for weakness. Negative for dizziness and headaches.  Psychiatric/Behavioral:  Negative for behavioral problems and sleep disturbance. The patient is not nervous/anxious.     Health Maintenance  Topic Date Due   Fecal DNA (Cologuard)  03/09/2021   Pneumococcal Vaccine: 50+ Years  Completed   Influenza Vaccine  Completed   HPV VACCINES (No Doses Required) Completed   Hepatitis C Screening  Completed   HIV Screening  Completed   Hepatitis B Vaccines 19-59 Average Risk  Aged Out   Meningococcal B Vaccine  Aged Out   DTaP/Tdap/Td  Discontinued   COVID-19 Vaccine  Discontinued   Zoster Vaccines- Shingrix  Discontinued    Physical Exam: Vitals:   05/15/24 1318  BP: 136/78  Pulse: 84  Temp: 97.7 F (36.5 C)  TempSrc: Temporal  SpO2: 98%   There is no height or weight on file to calculate BMI. Physical Exam Constitutional:      Appearance: Normal appearance.  HENT:  Head: Normocephalic and atraumatic.     Mouth/Throat:     Mouth: Mucous membranes are moist.  Eyes:     Conjunctiva/sclera: Conjunctivae normal.  Cardiovascular:     Rate and Rhythm: Normal rate and regular rhythm.     Pulses: Normal pulses.     Heart sounds: Normal heart sounds.  Pulmonary:     Effort: Pulmonary effort is normal.     Breath sounds: Normal breath sounds.  Abdominal:     General: Bowel sounds are normal.     Palpations: Abdomen is soft.  Musculoskeletal:        General: No swelling. Normal range of motion.     Cervical back: Normal range of motion.   Skin:    General: Skin is warm and dry.     Comments: Right heel open wound measuring 0.5 X 0.5 cm, scar on anterior left foot  Neurological:     General: No focal deficit present.     Mental Status: He is alert and oriented to person, place, and time.     Comments: Left-sided weakness  Psychiatric:        Mood and Affect: Mood normal.        Behavior: Behavior normal.        Thought Content: Thought content normal.        Judgment: Judgment normal.     Labs reviewed: Basic Metabolic Panel: Recent Labs    07/09/23 1546 11/09/23 1328 03/04/24 1512  NA 141 143  --   K 3.7 3.6  --   CL 101 105  --   CO2 29 28  --   GLUCOSE 123 94  --   BUN 24 18  --   CREATININE 1.10 0.88  --   CALCIUM 9.7 9.8  --   TSH  --   --  0.684   Liver Function Tests: No results for input(s): AST, ALT, ALKPHOS, BILITOT, PROT, ALBUMIN in the last 8760 hours. No results for input(s): LIPASE, AMYLASE in the last 8760 hours. No results for input(s): AMMONIA in the last 8760 hours. CBC: Recent Labs    07/19/23 1337 11/09/23 1328 03/04/24 1512  WBC 8.0 6.7 15.8*  NEUTROABS 5,000 4,100 12.8*  HGB 13.4 13.9 15.5  HCT 41.5 43.4 48.3  MCV 82.0 84.1 86  PLT 376 229 176   Lipid Panel: Recent Labs    11/09/23 1328  CHOL 226*  HDL 74  LDLCALC 134*  TRIG 83  CHOLHDL 3.1   Lab Results  Component Value Date   HGBA1C 5.9 (H) 11/09/2023    Procedures since last visit: No results found.  Assessment/Plan     Labs/tests ordered:     Return in about 3 months (around 08/13/2024).  Randy Finken Medina-Vargas, NP     [1]  Allergies Allergen Reactions   Peanut-Containing Drug Products Shortness Of Breath   Zanaflex  [Tizanidine  Hcl] Shortness Of Breath   Interferon Beta-1a Other (See Comments)   "

## 2024-05-16 ENCOUNTER — Telehealth: Payer: Self-pay

## 2024-05-16 ENCOUNTER — Ambulatory Visit: Payer: Self-pay | Admitting: Adult Health

## 2024-05-16 MED ORDER — APIXABAN 5 MG PO TABS: 5.0000 mg | ORAL_TABLET | Freq: Two times a day (BID) | ORAL | 3 refills | Status: AC

## 2024-05-16 NOTE — Telephone Encounter (Signed)
 Copied from CRM [id number]. Topic: Clinical - Medication Refill >> May 16, 2024  2:41 PM DeAngela L wrote: Medication: apixaban  (ELIQUIS ) 5 MG TABS tablet   Has the patient contacted their pharmacy? No  (Agent: If no, request that the patient contact the pharmacy for the refill. If patient does not wish to contact the pharmacy document the reason why and proceed with request.) (Agent: If yes, when and what did the pharmacy advise?)  This is the patient's preferred pharmacy:  [id number] - [place] ([place] - [address] [place] ([place]) [place] [address] Phone: [phone] Fax: [phone]  Is this the correct pharmacy for this prescription? Yes  If no, delete pharmacy and type the correct one.   Has the prescription been filled recently? Yes   Is the patient out of the medication? Yes   Has the patient been seen for an appointment in the last year OR does the patient have an upcoming appointment? Yes   Can we respond through MyChart? Yes   Agent: Please be advised that Rx refills may take up to 3 business days. We ask that you follow-up with your pharmacy.

## 2024-05-16 NOTE — Telephone Encounter (Signed)
 Refill last filled by vascular surgeon please advise.

## 2024-05-16 NOTE — Telephone Encounter (Signed)
 eRx for Eliquis  sent to pharmacy.

## 2024-05-16 NOTE — Progress Notes (Signed)
-   A1c 5.7, down from 5.9, very good -   No anemia - Cholesterol 204, down from 226 and LDL 129, down from 134,   triglycerides normal, continue low-fat diet and exercise -Vitamin D  optimal -    Continue current medications

## 2024-05-17 ENCOUNTER — Other Ambulatory Visit: Payer: Self-pay | Admitting: Adult Health

## 2024-05-19 LAB — CBC WITH DIFFERENTIAL/PLATELET
Absolute Lymphocytes: 2086 {cells}/uL (ref 850–3900)
Absolute Monocytes: 504 {cells}/uL (ref 200–950)
Basophils Absolute: 63 {cells}/uL (ref 0–200)
Basophils Relative: 0.9 %
Eosinophils Absolute: 350 {cells}/uL (ref 15–500)
Eosinophils Relative: 5 %
HCT: 41.7 % (ref 39.4–51.1)
Hemoglobin: 13.3 g/dL (ref 13.2–17.1)
MCH: 26.4 pg — ABNORMAL LOW (ref 27.0–33.0)
MCHC: 31.9 g/dL (ref 31.6–35.4)
MCV: 82.9 fL (ref 81.4–101.7)
MPV: 11.7 fL (ref 7.5–12.5)
Monocytes Relative: 7.2 %
Neutro Abs: 3997 {cells}/uL (ref 1500–7800)
Neutrophils Relative %: 57.1 %
Platelets: 297 Thousand/uL (ref 140–400)
RBC: 5.03 Million/uL (ref 4.20–5.80)
RDW: 12.3 % (ref 11.0–15.0)
Total Lymphocyte: 29.8 %
WBC: 7 Thousand/uL (ref 3.8–10.8)

## 2024-05-19 LAB — COMPREHENSIVE METABOLIC PANEL WITH GFR
AG Ratio: 1.5 (calc) (ref 1.0–2.5)
ALT: 13 U/L (ref 9–46)
AST: 14 U/L (ref 10–35)
Albumin: 4.3 g/dL (ref 3.6–5.1)
Alkaline phosphatase (APISO): 87 U/L (ref 35–144)
BUN: 12 mg/dL (ref 7–25)
CO2: 31 mmol/L (ref 20–32)
Calcium: 10.1 mg/dL (ref 8.6–10.3)
Chloride: 103 mmol/L (ref 98–110)
Creat: 0.9 mg/dL (ref 0.70–1.35)
Globulin: 2.8 g/dL (ref 1.9–3.7)
Glucose, Bld: 100 mg/dL — ABNORMAL HIGH (ref 65–99)
Potassium: 3.9 mmol/L (ref 3.5–5.3)
Sodium: 143 mmol/L (ref 135–146)
Total Bilirubin: 0.5 mg/dL (ref 0.2–1.2)
Total Protein: 7.1 g/dL (ref 6.1–8.1)
eGFR: 95 mL/min/1.73m2

## 2024-05-19 LAB — HEMOGLOBIN A1C
Hgb A1c MFr Bld: 5.7 % — ABNORMAL HIGH
Mean Plasma Glucose: 117 mg/dL
eAG (mmol/L): 6.5 mmol/L

## 2024-05-19 LAB — LIPID PANEL
Cholesterol: 204 mg/dL — ABNORMAL HIGH
HDL: 58 mg/dL
LDL Cholesterol (Calc): 129 mg/dL — ABNORMAL HIGH
Non-HDL Cholesterol (Calc): 146 mg/dL — ABNORMAL HIGH
Total CHOL/HDL Ratio: 3.5 (calc)
Triglycerides: 78 mg/dL

## 2024-05-19 LAB — VITAMIN D 25 HYDROXY (VIT D DEFICIENCY, FRACTURES): Vit D, 25-Hydroxy: 70 ng/mL (ref 30–100)

## 2024-05-19 LAB — LEVETIRACETAM LEVEL: Keppra (Levetiracetam): 14.7 ug/mL

## 2024-05-26 ENCOUNTER — Other Ambulatory Visit: Payer: Self-pay | Admitting: Neurology

## 2024-05-26 ENCOUNTER — Other Ambulatory Visit: Payer: Self-pay | Admitting: Adult Health

## 2024-05-26 DIAGNOSIS — M792 Neuralgia and neuritis, unspecified: Secondary | ICD-10-CM

## 2024-05-26 DIAGNOSIS — G35D Multiple sclerosis, unspecified: Secondary | ICD-10-CM

## 2024-05-27 NOTE — Telephone Encounter (Signed)
 Last seen on 03/04/24 Follow up scheduled on 03/10/25  Dr. Onita approved on 03/21/24 that patient would continue . Rx sent

## 2024-05-29 ENCOUNTER — Telehealth: Payer: Self-pay | Admitting: Neurology

## 2024-05-29 DIAGNOSIS — G35D Multiple sclerosis, unspecified: Secondary | ICD-10-CM

## 2024-05-29 DIAGNOSIS — R269 Unspecified abnormalities of gait and mobility: Secondary | ICD-10-CM

## 2024-05-29 DIAGNOSIS — G8250 Quadriplegia, unspecified: Secondary | ICD-10-CM

## 2024-05-29 NOTE — Telephone Encounter (Signed)
 [facility] ([place]) Requesting order For Hospital Bed and 3 In 1 Commode. Asking for to be delivered to patient's home. Can fax order to [facility] in [place].

## 2024-06-04 NOTE — Telephone Encounter (Signed)
 Faxed to [facility] as requested at [phone]

## 2024-06-04 NOTE — Telephone Encounter (Signed)
 Orders Placed This Encounter  Procedures   For home use only DME Hospital bed

## 2024-06-04 NOTE — Addendum Note (Signed)
 Addended by: Nadie Fiumara on: 06/04/2024 12:07 PM   Modules accepted: Orders

## 2024-08-07 ENCOUNTER — Ambulatory Visit: Admitting: Adult Health

## 2025-03-10 ENCOUNTER — Telehealth: Admitting: Adult Health
